# Patient Record
Sex: Female | Born: 1960 | Race: White | Hispanic: No | Marital: Married | Smoking: Current every day smoker
Health system: Southern US, Community
[De-identification: ages and names within clinical notes are randomized; demographics above are authoritative.]

## PROBLEM LIST (undated history)

## (undated) DIAGNOSIS — I671 Cerebral aneurysm, nonruptured: Secondary | ICD-10-CM

## (undated) DIAGNOSIS — K5792 Diverticulitis of intestine, part unspecified, without perforation or abscess without bleeding: Secondary | ICD-10-CM

## (undated) DIAGNOSIS — M1990 Unspecified osteoarthritis, unspecified site: Secondary | ICD-10-CM

## (undated) DIAGNOSIS — K219 Gastro-esophageal reflux disease without esophagitis: Secondary | ICD-10-CM

## (undated) DIAGNOSIS — N189 Chronic kidney disease, unspecified: Secondary | ICD-10-CM

## (undated) DIAGNOSIS — C801 Malignant (primary) neoplasm, unspecified: Secondary | ICD-10-CM

## (undated) DIAGNOSIS — E041 Nontoxic single thyroid nodule: Secondary | ICD-10-CM

## (undated) DIAGNOSIS — Z972 Presence of dental prosthetic device (complete) (partial): Secondary | ICD-10-CM

## (undated) DIAGNOSIS — F4000 Agoraphobia, unspecified: Secondary | ICD-10-CM

## (undated) DIAGNOSIS — F32A Depression, unspecified: Secondary | ICD-10-CM

## (undated) DIAGNOSIS — I2510 Atherosclerotic heart disease of native coronary artery without angina pectoris: Secondary | ICD-10-CM

## (undated) DIAGNOSIS — K449 Diaphragmatic hernia without obstruction or gangrene: Secondary | ICD-10-CM

## (undated) DIAGNOSIS — R51 Headache: Secondary | ICD-10-CM

## (undated) DIAGNOSIS — F329 Major depressive disorder, single episode, unspecified: Secondary | ICD-10-CM

## (undated) DIAGNOSIS — G4730 Sleep apnea, unspecified: Secondary | ICD-10-CM

## (undated) DIAGNOSIS — Z8489 Family history of other specified conditions: Secondary | ICD-10-CM

## (undated) DIAGNOSIS — Z973 Presence of spectacles and contact lenses: Secondary | ICD-10-CM

## (undated) DIAGNOSIS — R519 Headache, unspecified: Secondary | ICD-10-CM

## (undated) HISTORY — PX: COLONOSCOPY W/ BIOPSIES AND POLYPECTOMY: SHX1376

## (undated) HISTORY — PX: CARPAL TUNNEL RELEASE: SHX101

## (undated) HISTORY — PX: MULTIPLE TOOTH EXTRACTIONS: SHX2053

## (undated) HISTORY — PX: BARIATRIC SURGERY: SHX1103

## (undated) HISTORY — PX: TONSILLECTOMY: SUR1361

---

## 2018-03-10 ENCOUNTER — Other Ambulatory Visit: Payer: Self-pay

## 2018-03-10 ENCOUNTER — Encounter (HOSPITAL_COMMUNITY): Payer: Self-pay

## 2018-03-10 ENCOUNTER — Observation Stay (HOSPITAL_COMMUNITY)
Admission: EM | Admit: 2018-03-10 | Discharge: 2018-03-13 | Disposition: A | Discharge status: Home / Self Care | Payer: Medicare Other | Attending: Internal Medicine | Admitting: Internal Medicine

## 2018-03-10 ENCOUNTER — Emergency Department (HOSPITAL_COMMUNITY): Payer: Medicare Other

## 2018-03-10 DIAGNOSIS — D015 Carcinoma in situ of liver, gallbladder and bile ducts: Secondary | ICD-10-CM | POA: Insufficient documentation

## 2018-03-10 DIAGNOSIS — R42 Dizziness and giddiness: Principal | ICD-10-CM | POA: Insufficient documentation

## 2018-03-10 DIAGNOSIS — F4000 Agoraphobia, unspecified: Secondary | ICD-10-CM | POA: Insufficient documentation

## 2018-03-10 DIAGNOSIS — R531 Weakness: Secondary | ICD-10-CM | POA: Diagnosis not present

## 2018-03-10 DIAGNOSIS — F17200 Nicotine dependence, unspecified, uncomplicated: Secondary | ICD-10-CM | POA: Insufficient documentation

## 2018-03-10 DIAGNOSIS — H532 Diplopia: Secondary | ICD-10-CM | POA: Diagnosis not present

## 2018-03-10 DIAGNOSIS — I671 Cerebral aneurysm, nonruptured: Secondary | ICD-10-CM | POA: Insufficient documentation

## 2018-03-10 DIAGNOSIS — R29898 Other symptoms and signs involving the musculoskeletal system: Secondary | ICD-10-CM | POA: Diagnosis not present

## 2018-03-10 DIAGNOSIS — R262 Difficulty in walking, not elsewhere classified: Secondary | ICD-10-CM | POA: Diagnosis not present

## 2018-03-10 DIAGNOSIS — Z720 Tobacco use: Secondary | ICD-10-CM | POA: Diagnosis present

## 2018-03-10 DIAGNOSIS — R200 Anesthesia of skin: Secondary | ICD-10-CM | POA: Diagnosis not present

## 2018-03-10 DIAGNOSIS — Z79899 Other long term (current) drug therapy: Secondary | ICD-10-CM | POA: Diagnosis not present

## 2018-03-10 DIAGNOSIS — F411 Generalized anxiety disorder: Secondary | ICD-10-CM | POA: Diagnosis not present

## 2018-03-10 DIAGNOSIS — C801 Malignant (primary) neoplasm, unspecified: Secondary | ICD-10-CM | POA: Diagnosis present

## 2018-03-10 DIAGNOSIS — R519 Headache, unspecified: Secondary | ICD-10-CM

## 2018-03-10 DIAGNOSIS — G459 Transient cerebral ischemic attack, unspecified: Secondary | ICD-10-CM

## 2018-03-10 DIAGNOSIS — R51 Headache: Secondary | ICD-10-CM

## 2018-03-10 HISTORY — DX: Agoraphobia, unspecified: F40.00

## 2018-03-10 HISTORY — DX: Malignant (primary) neoplasm, unspecified: C80.1

## 2018-03-10 LAB — COMPREHENSIVE METABOLIC PANEL
ALT: 17 U/L (ref 0–44)
AST: 18 U/L (ref 15–41)
Albumin: 3.8 g/dL (ref 3.5–5.0)
Alkaline Phosphatase: 101 U/L (ref 38–126)
Anion gap: 4 — ABNORMAL LOW (ref 5–15)
BILIRUBIN TOTAL: 0.5 mg/dL (ref 0.3–1.2)
BUN: 20 mg/dL (ref 6–20)
CO2: 25 mmol/L (ref 22–32)
Calcium: 9.4 mg/dL (ref 8.9–10.3)
Chloride: 110 mmol/L (ref 98–111)
Creatinine, Ser: 0.97 mg/dL (ref 0.44–1.00)
GFR calc Af Amer: >60 mL/min (ref >60)
GFR calc non Af Amer: >60 mL/min (ref >60)
Glucose, Bld: 98 mg/dL (ref 70–99)
POTASSIUM: 3.9 mmol/L (ref 3.5–5.1)
Sodium: 139 mmol/L (ref 135–145)
TOTAL PROTEIN: 6.9 g/dL (ref 6.5–8.1)

## 2018-03-10 LAB — RAPID URINE DRUG SCREEN, HOSP PERFORMED
Amphetamines: NOT DETECTED
Barbiturates: NOT DETECTED
Benzodiazepines: POSITIVE — AB
Cocaine: NOT DETECTED
Opiates: NOT DETECTED
Tetrahydrocannabinol: NOT DETECTED

## 2018-03-10 LAB — DIFFERENTIAL
Abs Immature Granulocytes: 0.03 10*3/uL (ref 0.00–0.07)
Basophils Absolute: 0 10*3/uL (ref 0.0–0.1)
Basophils Relative: 1 %
Eosinophils Absolute: 0.2 10*3/uL (ref 0.0–0.5)
Eosinophils Relative: 3 %
Immature Granulocytes: 1 %
LYMPHS PCT: 22 %
Lymphs Abs: 1.4 10*3/uL (ref 0.7–4.0)
Monocytes Absolute: 0.4 10*3/uL (ref 0.1–1.0)
Monocytes Relative: 5 %
Neutro Abs: 4.5 10*3/uL (ref 1.7–7.7)
Neutrophils Relative %: 68 %

## 2018-03-10 LAB — PROTIME-INR
INR: 0.97
Prothrombin Time: 12.8 seconds (ref 11.4–15.2)

## 2018-03-10 LAB — URINALYSIS, ROUTINE W REFLEX MICROSCOPIC
Bilirubin Urine: NEGATIVE
Glucose, UA: NEGATIVE mg/dL
HGB URINE DIPSTICK: NEGATIVE
Ketones, ur: NEGATIVE mg/dL
Nitrite: NEGATIVE
PROTEIN: NEGATIVE mg/dL
Specific Gravity, Urine: 1.028 (ref 1.005–1.030)
pH: 5 (ref 5.0–8.0)

## 2018-03-10 LAB — CBC
HCT: 43.3 % (ref 36.0–46.0)
Hemoglobin: 13.2 g/dL (ref 12.0–15.0)
MCH: 28 pg (ref 26.0–34.0)
MCHC: 30.5 g/dL (ref 30.0–36.0)
MCV: 91.9 fL (ref 80.0–100.0)
Platelets: 217 10*3/uL (ref 150–400)
RBC: 4.71 MIL/uL (ref 3.87–5.11)
RDW: 13.1 % (ref 11.5–15.5)
WBC: 6.5 10*3/uL (ref 4.0–10.5)
nRBC: 0 % (ref 0.0–0.2)

## 2018-03-10 LAB — ETHANOL: Alcohol, Ethyl (B): <10 mg/dL (ref <10)

## 2018-03-10 LAB — TSH: TSH: 0.805 u[IU]/mL (ref 0.350–4.500)

## 2018-03-10 LAB — VITAMIN B12: Vitamin B-12: 330 pg/mL (ref 180–914)

## 2018-03-10 LAB — SEDIMENTATION RATE: Sed Rate: 6 mm/hr (ref 0–22)

## 2018-03-10 LAB — APTT: aPTT: 25 seconds (ref 24–36)

## 2018-03-10 MED ORDER — NICOTINE 21 MG/24HR TD PT24
21.0000 mg | MEDICATED_PATCH | Freq: Every day | TRANSDERMAL | Status: DC
  Filled 2018-03-10 (×3): qty 1

## 2018-03-10 MED ORDER — SENNOSIDES-DOCUSATE SODIUM 8.6-50 MG PO TABS
1.0000 | ORAL_TABLET | Freq: Every evening | ORAL | Status: DC | PRN
  Filled 2018-03-10: qty 1

## 2018-03-10 MED ORDER — METOPROLOL SUCCINATE ER 25 MG PO TB24
12.5000 mg | ORAL_TABLET | Freq: Every morning | ORAL | Status: DC
  Administered 2018-03-11 – 2018-03-13 (×3): 12.5 mg via ORAL
  Filled 2018-03-10 (×3): qty 1

## 2018-03-10 MED ORDER — ACETAMINOPHEN 650 MG RE SUPP: 650.0000 mg | RECTAL | Status: DC | PRN

## 2018-03-10 MED ORDER — CELECOXIB 100 MG PO CAPS
200.0000 mg | ORAL_CAPSULE | Freq: Every day | ORAL | Status: DC
  Administered 2018-03-11 – 2018-03-12 (×2): 200 mg via ORAL
  Filled 2018-03-10: qty 2
  Filled 2018-03-10: qty 1
  Filled 2018-03-10: qty 2

## 2018-03-10 MED ORDER — STROKE: EARLY STAGES OF RECOVERY BOOK
Freq: Once | Status: AC
  Administered 2018-03-11: 05:00:00
  Filled 2018-03-10 (×2): qty 1

## 2018-03-10 MED ORDER — ENOXAPARIN SODIUM 40 MG/0.4ML ~~LOC~~ SOLN
40.0000 mg | SUBCUTANEOUS | Status: DC
  Administered 2018-03-10: 40 mg via SUBCUTANEOUS
  Filled 2018-03-10 (×2): qty 0.4

## 2018-03-10 MED ORDER — SODIUM CHLORIDE 0.9 % IV SOLN
INTRAVENOUS | Status: DC
  Administered 2018-03-10 – 2018-03-12 (×4): via INTRAVENOUS

## 2018-03-10 MED ORDER — BUPROPION HCL ER (SR) 150 MG PO TB12
150.0000 mg | ORAL_TABLET | Freq: Two times a day (BID) | ORAL | Status: DC
  Administered 2018-03-11 – 2018-03-13 (×5): 150 mg via ORAL
  Filled 2018-03-10 (×7): qty 1

## 2018-03-10 MED ORDER — ASPIRIN 325 MG PO TABS
325.0000 mg | ORAL_TABLET | Freq: Every day | ORAL | Status: DC
  Administered 2018-03-10 – 2018-03-13 (×4): 325 mg via ORAL
  Filled 2018-03-10 (×4): qty 1

## 2018-03-10 MED ORDER — ACETAMINOPHEN 325 MG PO TABS
650.0000 mg | ORAL_TABLET | ORAL | Status: DC | PRN
  Administered 2018-03-12 (×2): 650 mg via ORAL
  Filled 2018-03-10 (×2): qty 2

## 2018-03-10 MED ORDER — VENLAFAXINE HCL ER 75 MG PO CP24
150.0000 mg | ORAL_CAPSULE | Freq: Every day | ORAL | Status: DC
  Administered 2018-03-11 – 2018-03-13 (×3): 150 mg via ORAL
  Filled 2018-03-10 (×3): qty 2

## 2018-03-10 MED ORDER — LORAZEPAM 2 MG/ML IJ SOLN: 0.5000 mg | INTRAMUSCULAR | Status: DC | PRN

## 2018-03-10 MED ORDER — SODIUM CHLORIDE 0.9 % IV SOLN
1.0000 g | INTRAVENOUS | Status: DC
  Administered 2018-03-10 – 2018-03-11 (×2): 1 g via INTRAVENOUS
  Filled 2018-03-10: qty 1
  Filled 2018-03-10 (×2): qty 10

## 2018-03-10 MED ORDER — PANTOPRAZOLE SODIUM 40 MG PO TBEC
40.0000 mg | DELAYED_RELEASE_TABLET | Freq: Every morning | ORAL | Status: DC
  Administered 2018-03-11 – 2018-03-12 (×2): 40 mg via ORAL
  Filled 2018-03-10 (×3): qty 1

## 2018-03-10 MED ORDER — SIMVASTATIN 20 MG PO TABS
20.0000 mg | ORAL_TABLET | Freq: Every morning | ORAL | Status: DC
  Administered 2018-03-11 – 2018-03-13 (×3): 20 mg via ORAL
  Filled 2018-03-10 (×3): qty 1

## 2018-03-10 MED ORDER — CALCIUM POLYCARBOPHIL 625 MG PO TABS
625.0000 mg | ORAL_TABLET | Freq: Two times a day (BID) | ORAL | Status: DC
  Administered 2018-03-11 – 2018-03-13 (×4): 625 mg via ORAL
  Filled 2018-03-10 (×9): qty 1

## 2018-03-10 MED ORDER — ACETAMINOPHEN 160 MG/5ML PO SOLN: 650.0000 mg | ORAL | Status: DC | PRN

## 2018-03-10 MED ORDER — ASPIRIN 300 MG RE SUPP: 300.0000 mg | Freq: Every day | RECTAL | Status: DC

## 2018-03-10 NOTE — ED Triage Notes (Addendum)
Pt reports on THursday she went into the grocery store and suddenly became dizzy and "legs wouldn't work right."  Reports headache since Thursday, worse today.  Pt also reports numbness to left side of face that started THursday.  Pt also says Thursday she had nausea and blurred vision after the dizziness passed.  Pt says those symptoms have gone away.  PT reports has liver cancer and needs a liver transplant.  Pt says is not currently under any cancer treatment, they are monitoring the growth of the cancer.

## 2018-03-10 NOTE — ED Provider Notes (Signed)
[facility] EMERGENCY DEPARTMENT Provider Note   CSN: [id number] Arrival date & time: 03/10/18  1024     History   Chief Complaint Chief Complaint  Patient presents with  . Dizziness    HPI Rebecca Cox is a 58 y.o. female.  HPI Patient presents with episode of dizziness difficulty walking and vision changes.  Also facial tingling.  Initially had the episode on Thursday with today being Sunday.  Headache started at x2.  Headache is dull.  States she was walking the grocery store turned a corner and her vision became strange.  States the color spread out.  States she had difficulty walking her legs would not do what she wanted to do.  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  Since then has continued headache.  States the vision stuff resolved after around 15 minutes and legs are feeling better but still does not feel back to normal.  States her face still feels tingly.  Has a history of a liver cancer.  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  Patient states the ears were ringing during the episode. Past Medical History:  Diagnosis Date  . Agoraphobia   . Cancer [facility])    liver    There are no active problems to display for this patient.   Past Surgical History:  Procedure Laterality Date  . BARIATRIC SURGERY    . CARPAL TUNNEL RELEASE    . TONSILLECTOMY       OB History   No obstetric history on file.      Home Medications    Prior to Admission medications   Not on File    Family History No family history on file.  Social History Social History   Tobacco Use  . Smoking status: Current Every Day Smoker  . Smokeless tobacco: Never Used  Substance Use Topics  . Alcohol use: Never    Frequency: Never  . Drug use: Never     Allergies   Patient has no known  allergies.   Review of Systems Review of Systems  Constitutional: Negative for appetite change.  HENT: Negative for congestion.   Eyes: Positive for visual disturbance.  Cardiovascular: Negative for chest pain.  Gastrointestinal: Negative for abdominal pain.  Genitourinary: Negative for flank pain.  Musculoskeletal: Negative for back pain.  Skin: Negative for rash.  Neurological: Positive for dizziness, speech difficulty and headaches. Negative for tremors and weakness.  Psychiatric/Behavioral: Negative for confusion.     Physical Exam Updated Vital Signs BP 92/77   Pulse 89   Temp (!) 97.4 F (36.3 C) (Oral)   Resp 16   Ht 5\' 5"  (1.651 m)   Wt 95.3 kg   SpO2 97%   BMI 34.95 kg/m   Physical Exam HENT:     Head: Normocephalic.     Comments: Paresthesias to the left lower face.    Right Ear: Tympanic membrane normal.     Left Ear: Tympanic membrane normal.  Eyes:     Extraocular Movements: Extraocular movements intact.     Pupils: Pupils are equal, round, and reactive to light.     Comments: Nystagmus with gaze to the left.  Neck:     Musculoskeletal: Normal range of motion.  Cardiovascular:     Rate and Rhythm: Regular rhythm.  Pulmonary:     Effort: Pulmonary effort is normal.     Breath sounds: No wheezing or rhonchi.  Abdominal:     Tenderness: There is no abdominal tenderness.  Musculoskeletal:        General: No tenderness.  Skin:    General: Skin is warm.     Capillary Refill: Capillary refill takes less than 2 seconds.  Neurological:     Mental Status: She is alert.     Comments: Patient was diagnosed with gaze to left.  Patient is somewhat slurred speech.  Husband states that this is typical for her.  Good grip strength bilaterally.  Finger-nose intact bilaterally.  Heel shin intact bilaterally.  Mildly unstable standing with eyes closed and arms out.      ED Treatments / Results  Labs (all labs ordered are listed, but only abnormal results are  displayed) Labs Reviewed  COMPREHENSIVE METABOLIC PANEL - Abnormal; Notable for the following components:      Result Value   Anion gap 4 (*)    All other components within normal limits  RAPID URINE DRUG SCREEN, HOSP PERFORMED - Abnormal; Notable for the following components:   Benzodiazepines POSITIVE (*)    All other components within normal limits  URINALYSIS, ROUTINE W REFLEX MICROSCOPIC - Abnormal; Notable for the following components:   APPearance CLOUDY (*)    Leukocytes, UA MODERATE (*)    Bacteria, UA RARE (*)    All other components within normal limits  ETHANOL  PROTIME-INR  APTT  CBC  DIFFERENTIAL    EKG EKG Interpretation  Date/Time:  Sunday March 10 2018 10:38:52 EST Ventricular Rate:  88 PR Interval:    QRS Duration: 98 QT Interval:  371 QTC Calculation: 449 R Axis:   80 Text Interpretation:  Sinus rhythm Low voltage, precordial leads Borderline T wave abnormalities Baseline wander in lead(s) II III aVR aVF V3 V6 Confirmed by Davonna Belling [phone]) on 03/10/2018 12:02:15 PM   Radiology Ct Head Wo Contrast  Result Date: 03/10/2018 CLINICAL DATA:  Acute onset dizziness and difficulty walking today. EXAM: CT HEAD WITHOUT CONTRAST TECHNIQUE: Contiguous axial images were obtained from the base of the skull through the vertex without intravenous contrast. COMPARISON:  None. FINDINGS: Brain: No evidence of acute infarction, hemorrhage, hydrocephalus, extra-axial collection or mass lesion/mass effect. Vascular: No hyperdense vessel or unexpected calcification. Skull: Normal. Negative for fracture or focal lesion. Sinuses/Orbits: Negative. Other: None. IMPRESSION: Negative head CT. Electronically Signed   By: Inge Rise M.D.   On: 03/10/2018 12:09    Procedures Procedures (including critical care time)  Medications Ordered in ED Medications - No data to display   Initial Impression / Assessment and Plan / ED Course  I have reviewed the triage vital signs  and the nursing notes.  Pertinent labs & imaging results that were available during my care of the patient were reviewed by me and considered in my medical decision making (see chart for details).     Patient had an episode 3 days ago where her vision changed she felt dizzy had ringing in her ears and had difficulty walking.  States her legs would not do what she wanted.  Had left-sided facial tingling to states that facial tingling is still there.  Head CT reassuring.  Does have known malignancy in abdomen and chest.  With focal neuro deficits to involve vision and difficulty walking I feel patient would benefit from admission to hospital for further evaluation and treatment.  Final Clinical  Impressions(s) / ED Diagnoses   Final diagnoses:  Difficulty walking    ED Discharge Orders    None       Davonna Belling, MD 03/10/18 1414

## 2018-03-10 NOTE — H&P (Addendum)
History and Physical  Rebecca Cox JJK:[id number] DOB: 1961-01-17 DOA: 03/10/2018  Referring physician: Alvino Chapel MD  PCP: System, Pcp Not In   Chief Complaint: Dizziness   HPI: Rebecca Cox is a 58 y.o. female with Agoraphobia and generalized anxiety disorder presented to the emergency department today with complaints of dizziness.  The patient describes that she started having headaches and dizziness associated with left-sided facial tingling 3 days ago and another episode occurred today.  She reported walking to the grocery store and having sudden onset of visual changes.  She also had difficulty walking and describes that her legs would not do what she wanted them to do.  She said that she had taken Klonopin prior to going to the store today.  She has to take that to leave her home because of her anxiety disorder and agoraphobia.  She has continued to have headache intermittently.  Most of her symptoms resolved after 15 minutes but she still continues to have left facial tingling and drooling around the left side of the mouth.  She has a history of epithelioid hemangioendothelioma of the liver and she is followed at [facility].  She will need to have a liver transplant to treat her condition.  She was recently seen by them and was stable and told to follow-up in 1 year.  The patient says that during the episode where she had visual changes she also had ringing in the ears.  She was seen in the emergency department today and a CT of the brain did not reveal any acute findings.  Given her symptomatology and presentation it was felt that a TIA/CVA work-up should be obtained to rule out acute injury.  The patient is being admitted for observation.  Review of Systems:  Constitutional: Negative for appetite change.  HENT: Negative for congestion.   Eyes: Positive for visual disturbance.  Cardiovascular: Negative for chest pain.  Gastrointestinal: Negative for abdominal pain.  Genitourinary: Negative for  flank pain.  Musculoskeletal: Negative for back pain.  Skin: Negative for rash.  Neurological: Positive for dizziness, speech difficulty and headaches. Negative for tremors and weakness.  Psychiatric/Behavioral: Negative for confusion.  All systems reviewed and apart from history of presenting illness, are negative.  Past Medical History:  Diagnosis Date  . Agoraphobia   . Cancer [facility])    liver   Past Surgical History:  Procedure Laterality Date  . BARIATRIC SURGERY    . CARPAL TUNNEL RELEASE    . TONSILLECTOMY     Social History:  reports that she has been smoking. She has never used smokeless tobacco. She reports that she does not drink alcohol or use drugs.  No Known Allergies  History reviewed. No pertinent family history.  Prior to Admission medications   Not on File  Awaiting for home medications to be reconciled  Physical Exam: Vitals:   03/10/18 1031 03/10/18 1036 03/10/18 1130 03/10/18 1200  BP:  (!) 129/98 126/77 92/77  Pulse:  89    Resp:  16 13 16   Temp:  (!) 97.4 F (36.3 C)    TempSrc:  Oral    SpO2:  100%  97%  Weight: 95.3 kg     Height: 5\' 5"  (1.651 m)        General exam: Moderately built and nourished patient, lying comfortably supine on the gurney in no obvious distress.  Head, eyes and ENT: Nontraumatic and normocephalic. Pupils equally reacting to light and accommodation.  Leftward gaze with nystagmus noted.  Oral mucosa  moist.  Neck: Supple. No JVD, carotid bruit or thyromegaly.  Lymphatics: No lymphadenopathy.  Respiratory system: Clear to auscultation. No increased work of breathing.  Cardiovascular system: Normal S1 and S2 heard, RRR. No JVD, murmurs, gallops, clicks or pedal edema.  Gastrointestinal system: Abdomen is nondistended, soft and nontender. Normal bowel sounds heard. No organomegaly or masses appreciated.  Central nervous system: Alert and oriented.  Normal speech.  Paresthesia noted left lower face.  Small amount of  dribble at left side of mouth.  Tongue protrudes symmetrically.  Extremities: Symmetric 5 x 5 power. Peripheral pulses symmetrically felt.   Skin: No rashes or acute findings.  Musculoskeletal system: Negative exam.  Psychiatry: Pleasant and cooperative.  Labs on Admission:  Basic Metabolic Panel: Recent Labs  Lab 03/10/18 1058  NA 139  K 3.9  CL 110  CO2 25  GLUCOSE 98  BUN 20  CREATININE 0.97  CALCIUM 9.4   Liver Function Tests: Recent Labs  Lab 03/10/18 1058  AST 18  ALT 17  ALKPHOS 101  BILITOT 0.5  PROT 6.9  ALBUMIN 3.8   No results for input(s): LIPASE, AMYLASE in the last 168 hours. No results for input(s): AMMONIA in the last 168 hours. CBC: Recent Labs  Lab 03/10/18 1058  WBC 6.5  NEUTROABS 4.5  HGB 13.2  HCT 43.3  MCV 91.9  PLT 217   Cardiac Enzymes: No results for input(s): CKTOTAL, CKMB, CKMBINDEX, TROPONINI in the last 168 hours.  BNP (last 3 results) No results for input(s): PROBNP in the last 8760 hours. CBG: No results for input(s): GLUCAP in the last 168 hours.  Radiological Exams on Admission: Ct Head Wo Contrast  Result Date: 03/10/2018 CLINICAL DATA:  Acute onset dizziness and difficulty walking today. EXAM: CT HEAD WITHOUT CONTRAST TECHNIQUE: Contiguous axial images were obtained from the base of the skull through the vertex without intravenous contrast. COMPARISON:  None. FINDINGS: Brain: No evidence of acute infarction, hemorrhage, hydrocephalus, extra-axial collection or mass lesion/mass effect. Vascular: No hyperdense vessel or unexpected calcification. Skull: Normal. Negative for fracture or focal lesion. Sinuses/Orbits: Negative. Other: None. IMPRESSION: Negative head CT. Electronically Signed   By: Inge Rise M.D.   On: 03/10/2018 12:09   EKG: Independently reviewed. NSR no acute ST-T findings.  Assessment/Plan Principal Problem:   Left facial numbness Active Problems:   Bilateral leg weakness   Tobacco abuse    Cancer (HCC)   Agoraphobia  1. Acute neurological changes -the patient continues to have a left facial numbness and drooling from the left side of the mouth.  There is no facial droop seen.  The patient had weakness in the extremities and difficulty controlling her extremities for about 15 minutes.  There is concern about TIA.  CT head is negative.  She is being admitted for work-up.  Aspirin for antiplatelet therapy ordered.  Check an MRI brain/MRA tomorrow.  Check 2D echocardiogram and carotid Doppler studies.  Check a fasting lipid panel and A1c.  Check TSH, B12.  Monitor on continuous telemetry overnight.  Obtain PT/OT/SLP evaluation.  Obtain an EEG.  If her testing is negative she could likely discharge home tomorrow.  If not, neurology consult may be warranted. 2. Chronic active nicotine dependence- I encouraged the patient to please stop using tobacco products.  She was counseled and verbalized understanding.  I have offered a nicotine patch to be used in the hospital to assist with nicotine cravings. 3. Generalized anxiety disorder with agoraphobia - lorazepam ordered as needed for  symptoms.  Patient says that she is able to do MRI and she has done them in the past.  She normally takes anxiety medicine prior to the procedure. 4. Epithelioid hemangioendothelioma-patient is followed for this at [facility] and was recently seen and has a 1 year follow-up.  The only treatment available is liver transplant which she needs to stop smoking before she can be seriously considered for this according to records.  DVT Prophylaxis: Lovenox Code Status: Full Family Communication: Husband at bedside Disposition Plan: Home in 1 to 2 days as work-up is completed  Time spent: 57 minutes  Irwin Brakeman, MD [facility]  If 7PM-7AM, please contact night-coverage [email or web address] Password [email or web address] 03/10/2018, 2:57 PM

## 2018-03-11 ENCOUNTER — Observation Stay (HOSPITAL_COMMUNITY): Payer: Medicare Other

## 2018-03-11 ENCOUNTER — Observation Stay (HOSPITAL_COMMUNITY)
Admit: 2018-03-11 | Discharge: 2018-03-11 | Disposition: A | Discharge status: Home / Self Care | Payer: Medicare Other | Attending: Family Medicine | Admitting: Family Medicine

## 2018-03-11 ENCOUNTER — Observation Stay (HOSPITAL_BASED_OUTPATIENT_CLINIC_OR_DEPARTMENT_OTHER): Payer: Medicare Other

## 2018-03-11 DIAGNOSIS — G459 Transient cerebral ischemic attack, unspecified: Secondary | ICD-10-CM

## 2018-03-11 DIAGNOSIS — R200 Anesthesia of skin: Secondary | ICD-10-CM | POA: Diagnosis not present

## 2018-03-11 DIAGNOSIS — C801 Malignant (primary) neoplasm, unspecified: Secondary | ICD-10-CM

## 2018-03-11 DIAGNOSIS — F4000 Agoraphobia, unspecified: Secondary | ICD-10-CM | POA: Diagnosis not present

## 2018-03-11 LAB — ECHOCARDIOGRAM COMPLETE
Height: 65 in
Weight: 3360 oz

## 2018-03-11 LAB — LIPID PANEL
Cholesterol: 175 mg/dL (ref 0–200)
HDL: 48 mg/dL (ref >40)
LDL Cholesterol: 96 mg/dL (ref 0–99)
Total CHOL/HDL Ratio: 3.6 RATIO
Triglycerides: 155 mg/dL — ABNORMAL HIGH (ref <150)
VLDL: 31 mg/dL (ref 0–40)

## 2018-03-11 LAB — COMPREHENSIVE METABOLIC PANEL
ALT: 14 U/L (ref 0–44)
AST: 16 U/L (ref 15–41)
Albumin: 3.2 g/dL — ABNORMAL LOW (ref 3.5–5.0)
Alkaline Phosphatase: 80 U/L (ref 38–126)
Anion gap: 3 — ABNORMAL LOW (ref 5–15)
BUN: 22 mg/dL — ABNORMAL HIGH (ref 6–20)
CO2: 23 mmol/L (ref 22–32)
Calcium: 8.9 mg/dL (ref 8.9–10.3)
Chloride: 113 mmol/L — ABNORMAL HIGH (ref 98–111)
Creatinine, Ser: 1.03 mg/dL — ABNORMAL HIGH (ref 0.44–1.00)
GFR calc Af Amer: >60 mL/min (ref >60)
GFR calc non Af Amer: >60 mL/min (ref >60)
Glucose, Bld: 77 mg/dL (ref 70–99)
POTASSIUM: 4.1 mmol/L (ref 3.5–5.1)
Sodium: 139 mmol/L (ref 135–145)
Total Bilirubin: 0.3 mg/dL (ref 0.3–1.2)
Total Protein: 5.7 g/dL — ABNORMAL LOW (ref 6.5–8.1)

## 2018-03-11 LAB — SEDIMENTATION RATE: Sed Rate: 7 mm/hr (ref 0–22)

## 2018-03-11 LAB — HEMOGLOBIN A1C
Hgb A1c MFr Bld: 4.7 % — ABNORMAL LOW (ref 4.8–5.6)
Mean Plasma Glucose: 88.19 mg/dL

## 2018-03-11 MED ORDER — LORAZEPAM 2 MG/ML IJ SOLN: 1.0000 mg | Freq: Once | INTRAMUSCULAR | Status: DC | PRN

## 2018-03-11 MED ORDER — CLONAZEPAM 0.5 MG PO TABS
0.5000 mg | ORAL_TABLET | Freq: Two times a day (BID) | ORAL | Status: DC
  Administered 2018-03-11 – 2018-03-13 (×5): 0.5 mg via ORAL
  Filled 2018-03-11 (×5): qty 1

## 2018-03-11 MED ORDER — CLONAZEPAM 0.5 MG PO TABS: 0.5000 mg | ORAL_TABLET | Freq: Three times a day (TID) | ORAL | Status: DC

## 2018-03-11 MED ORDER — CLONAZEPAM 0.5 MG PO TABS
0.2500 mg | ORAL_TABLET | Freq: Every day | ORAL | Status: DC | PRN
  Administered 2018-03-12: 0.5 mg via ORAL
  Filled 2018-03-11 (×2): qty 1

## 2018-03-11 NOTE — Progress Notes (Signed)
SLP Cancellation Note  Patient Details Name: Rebecca Cox MRN: [id number] DOB: [birth year]   Cancelled treatment:       Reason Eval/Treat Not Completed: SLP screened, no needs identified, will sign off; SLP screened Pt in room. Pt denies any changes in swallowing, speech, language, or cognition. MRI negative for acute changes. SLE will be deferred at this time. Reconsult if indicated. SLP will sign off. Pt indicates that she recently had several teeth pulled about three weeks ago.  Thank you,  Genene Churn, [place]     [place] 03/11/2018, 1:02 PM

## 2018-03-11 NOTE — Care Management CC44 (Signed)
Condition Code 44 Documentation Completed  Patient Details  Name: Rebecca Cox MRN: [id number] Date of Birth: 1960-12-09   Condition Code 44 given:    Patient signature on Condition Code 44 notice:    Documentation of 2 MD's agreement:    Code 44 added to claim:       [name] 03/11/2018, 10:54 AM

## 2018-03-11 NOTE — Evaluation (Signed)
Physical Therapy Evaluation Patient Details Name: Rebecca Cox MRN: [id number] DOB: 24-Jul-1960 Today's Date: 03/11/2018   History of Present Illness  Rebecca Cox is a 58 y.o. female with Agoraphobia and generalized anxiety disorder presented to the emergency department today with complaints of dizziness.  The patient describes that she started having headaches and dizziness associated with left-sided facial tingling 3 days ago and another episode occurred today.  She reported walking to the grocery store and having sudden onset of visual changes.  She also had difficulty walking and describes that her legs would not do what she wanted them to do.  She said that she had taken Klonopin prior to going to the store today.  She has to take that to leave her home because of her anxiety disorder and agoraphobia.  She has continued to have headache intermittently.  Most of her symptoms resolved after 15 minutes but she still continues to have left facial tingling and drooling around the left side of the mouth.    Clinical Impression  Patient functioning at baseline for functional mobility and gait.  Plan:  Patient discharged from physical therapy to care of nursing for ambulation daily as tolerated for length of stay.     Follow Up Recommendations No PT follow up    Equipment Recommendations  None recommended by PT    Recommendations for Other Services       Precautions / Restrictions Precautions Precautions: None Restrictions Weight Bearing Restrictions: No      Mobility  Bed Mobility Overal bed mobility: Independent                Transfers Overall transfer level: Independent                  Ambulation/Gait Ambulation/Gait assistance: Modified independent (Device/Increase time) Gait Distance (Feet): 300 Feet Assistive device: None Gait Pattern/deviations: WFL(Within Functional Limits) Gait velocity: near normal   General Gait Details: good return for ambulation on  level, inclined and declined surfaces without loss of balance  Stairs            Wheelchair Mobility    Modified Rankin (Stroke Patients Only)       Balance Overall balance assessment: No apparent balance deficits (not formally assessed)                                           Pertinent Vitals/Pain Pain Assessment: No/denies pain    Home Living Family/patient expects to be discharged to:: Private residence Living Arrangements: Spouse/significant other Available Help at Discharge: Family Type of Home: House Home Access: Stairs to enter Entrance Stairs-Rails: Right Entrance Stairs-Number of Steps: 3 Home Layout: One level Home Equipment: Cane - single point;Cane - quad;Shower seat - built in      Prior Function Level of Independence: Independent         Comments: Hydrographic surveyor, drives     Journalist, newspaper        Extremity/Trunk Assessment   Upper Extremity Assessment Upper Extremity Assessment: Overall WFL for tasks assessed    Lower Extremity Assessment Lower Extremity Assessment: Overall WFL for tasks assessed    Cervical / Trunk Assessment Cervical / Trunk Assessment: Normal  Communication   Communication: No difficulties  Cognition Arousal/Alertness: Awake/alert Behavior During Therapy: WFL for tasks assessed/performed Overall Cognitive Status: Within Functional Limits for tasks assessed  General Comments      Exercises     Assessment/Plan    PT Assessment Patent does not need any further PT services  PT Problem List         PT Treatment Interventions      PT Goals (Current goals can be found in the Care Plan section)  Acute Rehab PT Goals Patient Stated Goal: return home with spouse to assist PT Goal Formulation: With patient/family Time For Goal Achievement: 03/11/18 Potential to Achieve Goals: Good    Frequency     Barriers to discharge         Co-evaluation               AM-PAC PT "6 Clicks" Mobility  Outcome Measure Help needed turning from your back to your side while in a flat bed without using bedrails?: None Help needed moving from lying on your back to sitting on the side of a flat bed without using bedrails?: None Help needed moving to and from a bed to a chair (including a wheelchair)?: None Help needed standing up from a chair using your arms (e.g., wheelchair or bedside chair)?: None Help needed to walk in hospital room?: None Help needed climbing 3-5 steps with a railing? : None 6 Click Score: 24    End of Session   Activity Tolerance: Patient tolerated treatment well Patient left: in bed;with call bell/phone within reach;with family/visitor present(seated at bedsde) Nurse Communication: Mobility status PT Visit Diagnosis: Unsteadiness on feet (R26.81);Other abnormalities of gait and mobility (R26.89);Muscle weakness (generalized) (M62.81)    Time: 1020-1035 PT Time Calculation (min) (ACUTE ONLY): 15 min   Charges:   PT Evaluation $PT Eval Low Complexity: 1 Low PT Treatments $Gait Training: 8-22 mins        1:59 PM, 03/11/18 Lonell Grandchild, MPT Physical Therapist with [facility] [phone] office [phone] mobile phone

## 2018-03-11 NOTE — Care Management Obs Status (Signed)
[place] NOTIFICATION   Patient Details  Name: Rebecca Cox MRN: [id number] Date of Birth: [birth year]   Medicare Observation Status Notification Given:  Yes    [name] 03/11/2018, 10:52 AM

## 2018-03-11 NOTE — Procedures (Addendum)
ELECTROENCEPHALOGRAM REPORT   Patient: Rebecca Cox       Room #: A333 EEG No. ID: [id number] Age: 58 y.o.        Sex: female Referring Physician: Memon Report Date:  03/11/2018        Interpreting Physician: Alexis Goodell  History: Rebecca Cox is an 58 y.o. female with acute neurological changes  Medications:  ASA, Wellbutrin, Rocephin, Celebrex, Klonopin, Toprol, Protonix, Zocor, Effexor  Conditions of Recording:  This is a 21 channel routine scalp EEG performed with bipolar and monopolar montages arranged in accordance to the international 10/20 system of electrode placement. One channel was dedicated to EKG recording.  The patient is in the awake state.  Description:  The waking background activity consists of a low voltage, symmetrical, fairly well organized, 10 Hz alpha activity, seen from the parieto-occipital and posterior temporal regions.  Low voltage fast activity, poorly organized, is seen anteriorly and is at times superimposed on more posterior regions.  A mixture of theta and alpha rhythms are seen from the central and temporal regions. The patient does not drowse or sleep. No epileptiform activity is noted.   Hyperventilation was not performed.  Intermittent photic stimulation was performed and elicits a symmetrical driving response but fails to elicit any abnormalities.  IMPRESSION: This is a normal electroencephalogram, awake and with activation procedures. There are no focal lateralizing or epileptiform features.   Alexis Goodell, MD Neurology [phone] 03/11/2018, 1:29 PM

## 2018-03-11 NOTE — Progress Notes (Signed)
EEG completed, results pending. 

## 2018-03-11 NOTE — Progress Notes (Signed)
  Echocardiogram 2D Echocardiogram has been performed.  Rebecca Cox 03/11/2018, 10:25 AM

## 2018-03-11 NOTE — Progress Notes (Signed)
SLP Cancellation Note  Patient Details Name: Rebecca Cox MRN: [id number] DOB: September 05, 1960   Cancelled treatment:       Reason Eval/Treat Not Completed: Patient at procedure or test/unavailable; Testing in room at this time. SLP will check back to determine if SLE is indicated. Chart review reveals that Pt is back to baseline except for facial tingling. MRI is negative for acute changes.  Thank you,  Genene Churn, [place]    [place] 03/11/2018, 12:09 PM

## 2018-03-11 NOTE — Progress Notes (Signed)
OT Cancellation Note  Patient Details Name: Rebecca Cox MRN: [id number] DOB: [birth year]   Cancelled Treatment:    Reason Eval/Treat Not Completed: OT screened, no needs identified, will sign off. Pt screened for OT needs. Pt reports symptoms have resolved with exception of slight facial tingling. Pt demonstrates BUE strength WNL, coordination and sensation are intact. Pt is performing ADLs independently this am, managing IV pole and line without difficulty. Pt is at baseline with ADL completion and functional mobility, no further OT services required at this time. Thank you for this referral.  Guadelupe Sabin, OTR/L  [phone] 03/11/2018, 7:52 AM

## 2018-03-11 NOTE — Progress Notes (Signed)
PROGRESS NOTE    Rebecca Cox  PJK:[id number] DOB: Jun 21, 1960 DOA: 03/10/2018 PCP: System, Pcp Not In    Brief Narrative:  58 year old female with chronic lightheadedness, presents to the hospital with complaints of transient dizziness, diplopia, and numbness on the left side of her face.  Work-up thus far has been unrevealing.  MRI does not show any acute infarct.  Neurology consultation has been requested.   Assessment & Plan:   Principal Problem:   Left facial numbness Active Problems:   Bilateral leg weakness   Tobacco abuse   Cancer (HCC)   Agoraphobia   1. Left facial numbness.  Patient describes transient left facial numbness, transient diplopia which has since resolved.  She has noticed some drooling out of the left side of her mouth.  MRI of the brain did not show any evidence of acute infarct.  Echocardiogram and carotid Dopplers also unrevealing.  EEG done does not show any evidence of epileptiform discharges.  Neurology consultation has been requested. 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 3. 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  Continue on benzodiazepines as needed. 4. Chronic nicotine dependence.  Counseled on the importance of cessation. 5. History of bariatric surgery.  She reports approximately 100 pound weight loss in the past year.   DVT prophylaxis: Lovenox Code Status: Full code Family Communication: Discussed with husband at the bedside Disposition Plan: Discharge home once neurology work-up is complete   Consultants:     Procedures:   YKD:XIPJ is a normal electroencephalogram, awake and with activation procedures. There are no focal lateralizing or epileptiform features. Echo:- Left ventricle: The cavity size was normal. Wall thickness was   increased in a pattern of mild LVH. Systolic function was normal.   The estimated ejection fraction was in the range of 60% to 65%.   Wall motion was normal; there  were no regional wall motion   abnormalities. Findings consistent with left ventricular   diastolic dysfunction, grade indeterminate.  - Atrial septum: No defect or patent foramen ovale was identified.  Antimicrobials:      Subjective: Still have some numbness on the left cheek.  No further vision disturbances.  Objective: Vitals:   03/11/18 1330 03/11/18 1443 03/11/18 1530 03/11/18 1730  BP: 101/79 106/76 105/71 99/74  Pulse: 79 78 80 79  Resp:  18 19   Temp: 98.4 F (36.9 C) 98.7 F (37.1 C) 98.5 F (36.9 C) 98.4 F (36.9 C)  TempSrc: Oral Oral Oral Oral  SpO2:  98% 99% 98%  Weight:      Height:        Intake/Output Summary (Last 24 hours) at 03/11/2018 1953 Last data filed at 03/11/2018 1700 Gross per 24 hour  Intake 1340.06 ml  Output -  Net 1340.06 ml   Filed Weights   03/10/18 1031  Weight: 95.3 kg    Examination:  General exam: Appears calm and comfortable  Respiratory system: Clear to auscultation. Respiratory effort normal. Cardiovascular system: S1 & S2 heard, RRR. No JVD, murmurs, rubs, gallops or clicks. No pedal edema. Gastrointestinal system: Abdomen is nondistended, soft and nontender. No organomegaly or masses felt. Normal bowel sounds heard. Central nervous system: Alert and oriented. No focal neurological deficits. Extremities: Symmetric 5 x 5 power. Skin: No rashes, lesions or ulcers Psychiatry: Judgement and insight appear normal. Mood & affect appropriate.     Data Reviewed: I have personally reviewed following labs and imaging studies  CBC: Recent Labs  Lab 03/10/18 1058  WBC 6.5  NEUTROABS 4.5  HGB 13.2  HCT 43.3  MCV 91.9  PLT 710   Basic Metabolic Panel: Recent Labs  Lab 03/10/18 1058  NA 139  K 3.9  CL 110  CO2 25  GLUCOSE 98  BUN 20  CREATININE 0.97  CALCIUM 9.4   GFR: Estimated Creatinine Clearance: 74.8 mL/min (by C-G formula based on SCr of 0.97 mg/dL). Liver Function Tests: Recent Labs  Lab 03/10/18 1058   AST 18  ALT 17  ALKPHOS 101  BILITOT 0.5  PROT 6.9  ALBUMIN 3.8   No results for input(s): LIPASE, AMYLASE in the last 168 hours. No results for input(s): AMMONIA in the last 168 hours. Coagulation Profile: Recent Labs  Lab 03/10/18 1058  INR 0.97   Cardiac Enzymes: No results for input(s): CKTOTAL, CKMB, CKMBINDEX, TROPONINI in the last 168 hours. BNP (last 3 results) No results for input(s): PROBNP in the last 8760 hours. HbA1C: Recent Labs    03/11/18 0622  HGBA1C 4.7*   CBG: No results for input(s): GLUCAP in the last 168 hours. Lipid Profile: Recent Labs    03/11/18 0624  CHOL 175  HDL 48  LDLCALC 96  TRIG 155*  CHOLHDL 3.6   Thyroid Function Tests: Recent Labs    03/10/18 1558  TSH 0.805   Anemia Panel: Recent Labs    03/10/18 1558  VITAMINB12 330   Sepsis Labs: No results for input(s): PROCALCITON, LATICACIDVEN in the last 168 hours.  Recent Results (from the past 240 hour(s))  Culture, blood (Routine X 2) w Reflex to ID Panel     Status: None (Preliminary result)   Collection Time: 03/10/18  4:03 PM  Result Value Ref Range Status   Specimen Description BLOOD RIGHT ARM  Final   Special Requests   Final    BOTTLES DRAWN AEROBIC AND ANAEROBIC Blood Culture adequate volume   Culture   Final    NO GROWTH < 24 HOURS Performed at [facility], [address] [place], [place] [address]    Report Status PENDING  Incomplete  Culture, blood (Routine X 2) w Reflex to ID Panel     Status: None (Preliminary result)   Collection Time: 03/10/18  4:09 PM  Result Value Ref Range Status   Specimen Description BLOOD LEFT HAND  Final   Special Requests   Final    BOTTLES DRAWN AEROBIC AND ANAEROBIC Blood Culture adequate volume   Culture   Final    NO GROWTH < 24 HOURS Performed at [facility], [address] [place], [place] [address]    Report Status PENDING  Incomplete         Radiology Studies: Ct Head Wo Contrast  Result Date:  03/10/2018 CLINICAL DATA:  Acute onset dizziness and difficulty walking today. EXAM: CT HEAD WITHOUT CONTRAST TECHNIQUE: Contiguous axial images were obtained from the base of the skull through the vertex without intravenous contrast. COMPARISON:  None. FINDINGS: Brain: No evidence of acute infarction, hemorrhage, hydrocephalus, extra-axial collection or mass lesion/mass effect. Vascular: No hyperdense vessel or unexpected calcification. Skull: Normal. Negative for fracture or focal lesion. Sinuses/Orbits: Negative. Other: None. IMPRESSION: Negative head CT. Electronically Signed   By: Inge Rise M.D.   On: 03/10/2018 12:09   Mr Brain Wo Contrast  Result Date: 03/11/2018 CLINICAL DATA:  Headache, new, malignancy suspected. Dizziness and weakness for 5 days EXAM: MRI HEAD WITHOUT CONTRAST MRA HEAD WITHOUT CONTRAST TECHNIQUE: Multiplanar, multiecho pulse sequences of the brain and surrounding structures  were obtained without intravenous contrast. Angiographic images of the head were obtained using MRA technique without contrast. COMPARISON:  None. FINDINGS: MRI HEAD FINDINGS Brain: No acute infarction, hemorrhage, hydrocephalus, extra-axial collection or mass lesion. Mild patchy FLAIR hyperintensity in the pons. Even milder periventricular FLAIR hyperintensity. Normal brain volume. Partially empty sella considered incidental in isolation. Vascular: Arterial findings below. Normal dural venous sinus flow voids. 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 No major branch occlusion or flow limiting stenosis. Negative for vessel beading. IMPRESSION: Brain MRI: 1. No acute finding or specific explanation for headache. 2. Mild signal abnormality in the pons and  periventricular white matter that is usually from chronic small vessel ischemia. Intracranial MRA: 1. 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 2. No emergent finding. Electronically Signed   By: Monte Fantasia M.D.   On: 03/11/2018 09:08   US Carotid Bilateral (at [facility] And [facility] Only)  Result Date: 03/11/2018 CLINICAL DATA:  58 year old female with history of vertigo EXAM: BILATERAL CAROTID DUPLEX ULTRASOUND TECHNIQUE: Pearline Cables scale imaging, color Doppler and duplex ultrasound were performed of bilateral carotid and vertebral arteries in the neck. COMPARISON:  None. FINDINGS: Criteria: Quantification of carotid stenosis is based on velocity parameters that correlate the residual internal carotid diameter with NASCET-based stenosis levels, using the diameter of the distal internal carotid lumen as the denominator for stenosis measurement. The following velocity measurements were obtained: RIGHT ICA:  Systolic 91 cm/sec, Diastolic 34 cm/sec CCA:  102 cm/sec SYSTOLIC ICA/CCA RATIO:  0.9 ECA:  96 cm/sec LEFT ICA:  Systolic 78 cm/sec, Diastolic 38 cm/sec CCA:  93 cm/sec SYSTOLIC ICA/CCA RATIO:  0.8 ECA:  77 cm/sec Right Brachial SBP: Not acquired Left Brachial SBP: Not acquired RIGHT CAROTID ARTERY: No significant calcified disease of the right common carotid artery. Intermediate waveform maintained. Heterogeneous plaque without significant calcifications at the right carotid bifurcation. Low resistance waveform of the right ICA. No significant tortuosity. RIGHT VERTEBRAL ARTERY: Antegrade flow with low resistance waveform. LEFT CAROTID ARTERY: No significant calcified disease of the left common carotid artery. Intermediate waveform maintained. Heterogeneous plaque at the left carotid bifurcation without significant calcifications. Low resistance waveform of the left ICA. LEFT VERTEBRAL ARTERY:  Antegrade flow with low resistance waveform. Additional: Right thyroid nodule measures 1.8 cm. IMPRESSION: Color duplex  indicates minimal heterogeneous plaque, with no hemodynamically significant stenosis by duplex criteria in the extracranial cerebrovascular circulation. Incidental finding of right thyroid nodule measuring 1.8 cm. Dedicated thyroid ultrasound recommended if further characterisation warranted. Signed, Dulcy Fanny. Dellia Nims, RPVI [facility] Radiology Electronically Signed   By: Corrie Mckusick D.O.   On: 03/11/2018 09:41   Mr Jodene Nam Head/brain VO Cm  Result Date: 03/11/2018 CLINICAL DATA:  Headache, new, malignancy suspected. Dizziness and weakness for 5 days EXAM: MRI HEAD WITHOUT CONTRAST MRA HEAD WITHOUT CONTRAST TECHNIQUE: Multiplanar, multiecho pulse sequences of the brain and surrounding structures were obtained without intravenous contrast. Angiographic images of the head were obtained using MRA technique without contrast. COMPARISON:  None. FINDINGS: MRI HEAD FINDINGS Brain: No acute infarction, hemorrhage, hydrocephalus, extra-axial collection or mass lesion. Mild patchy FLAIR hyperintensity in the pons. Even milder periventricular FLAIR hyperintensity. Normal brain volume. Partially empty sella considered incidental in isolation. Vascular: Arterial findings below.  Normal dural venous sinus flow voids. 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 No major branch occlusion or flow limiting stenosis. Negative for vessel beading. IMPRESSION: Brain MRI: 1. No acute finding or specific explanation for headache. 2. Mild signal abnormality in the pons and periventricular white matter that is usually from chronic small vessel ischemia. Intracranial MRA: 1. 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 2. No emergent finding.  Electronically Signed   By: Monte Fantasia M.D.   On: 03/11/2018 09:08        Scheduled Meds: . aspirin  300 mg Rectal Daily   Or  . aspirin  325 mg Oral Daily  . buPROPion  150 mg Oral BID  . celecoxib  200 mg Oral Q1500  . clonazePAM  0.5 mg Oral BID  . enoxaparin (LOVENOX) injection  40 mg Subcutaneous Q24H  . metoprolol succinate  12.5 mg Oral q morning - 10a  . nicotine  21 mg Transdermal Daily  . pantoprazole  40 mg Oral q morning - 10a  . polycarbophil  625 mg Oral BID  . simvastatin  20 mg Oral q morning - 10a  . venlafaxine XR  150 mg Oral Q breakfast   Continuous Infusions: . sodium chloride 75 mL/hr at 03/11/18 1829  . cefTRIAXone (ROCEPHIN)  IV 1 g (03/11/18 1830)     LOS: 0 days    Time spent: 30mins    Kathie Dike, MD [facility] Pager [phone]  If 7PM-7AM, please contact night-coverage [email or web address] Password [email or web address] 03/11/2018, 7:53 PM

## 2018-03-12 ENCOUNTER — Observation Stay (HOSPITAL_COMMUNITY): Payer: Medicare Other

## 2018-03-12 DIAGNOSIS — R29898 Other symptoms and signs involving the musculoskeletal system: Secondary | ICD-10-CM | POA: Diagnosis not present

## 2018-03-12 DIAGNOSIS — F4000 Agoraphobia, unspecified: Secondary | ICD-10-CM | POA: Diagnosis not present

## 2018-03-12 DIAGNOSIS — I671 Cerebral aneurysm, nonruptured: Secondary | ICD-10-CM | POA: Diagnosis present

## 2018-03-12 DIAGNOSIS — C801 Malignant (primary) neoplasm, unspecified: Secondary | ICD-10-CM | POA: Diagnosis not present

## 2018-03-12 LAB — CSF CELL COUNT WITH DIFFERENTIAL
RBC Count, CSF: 2 /mm3 — ABNORMAL HIGH
Tube #: 4
WBC, CSF: 0 /mm3 (ref 0–5)

## 2018-03-12 LAB — VITAMIN D 25 HYDROXY (VIT D DEFICIENCY, FRACTURES): VIT D 25 HYDROXY: 46.3 ng/mL (ref 30.0–100.0)

## 2018-03-12 LAB — CRYPTOCOCCAL ANTIGEN, CSF: Crypto Ag: NEGATIVE

## 2018-03-12 LAB — URINE CULTURE

## 2018-03-12 LAB — PHOSPHORUS: Phosphorus: 3.9 mg/dL (ref 2.5–4.6)

## 2018-03-12 LAB — PROTEIN AND GLUCOSE, CSF
GLUCOSE CSF: 55 mg/dL (ref 40–70)
Total  Protein, CSF: 22 mg/dL (ref 15–45)

## 2018-03-12 LAB — MAGNESIUM: Magnesium: 2 mg/dL (ref 1.7–2.4)

## 2018-03-12 LAB — HIV ANTIBODY (ROUTINE TESTING W REFLEX): HIV Screen 4th Generation wRfx: NONREACTIVE

## 2018-03-12 MED ORDER — POVIDONE-IODINE 10 % EX SOLN
CUTANEOUS | Status: AC
  Filled 2018-03-12: qty 15

## 2018-03-12 MED ORDER — GADOBUTROL 1 MMOL/ML IV SOLN
10.0000 mL | Freq: Once | INTRAVENOUS | Status: AC | PRN
  Administered 2018-03-12: 10 mL via INTRAVENOUS

## 2018-03-12 NOTE — Consult Note (Addendum)
Rebecca A. Merlene Laughter, MD     [email or web address]          Rebecca Cox is an 58 y.o. female.   ASSESSMENT/PLAN: 1.  Multiple symptoms without clear unifying diagnosis.  The patient does have residual lower left facial weakness and numbness.  Given that there is a history of malignancy, this is concerning.  The associated headaches and visual symptoms is suggestive of migraine headaches and/or seizures.  The pituitary looks somewhat unusual.  Given the history of malignancy, MRI with contrast will be obtained.  Spinal fluid analysis will also be obtained.  Further suggestions will depend on the initial work-up.  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  Given the size close to 5 mm, I think this should be addressed with coiling.  Referral can be made to Dr. Debria Garret.   The patient is a 58 year old white female who presents with acute onset of gait ataxia and the vertiginous symptoms a few days ago.  She was in the grocery store when this happened.  She reports spinning-like sensation and gait ataxia leaning towards one side.  She does not report alteration of consciousness or loss of consciousness.  She denies any focal numbness or weakness of the extremities.  She denies dysarthria or dysphasia.  The severe spinning sensation and ataxia lasted for about 10 minutes.  She however reports that she has had numbness involving the left facial region.  It appears that the entire left facial region was involved but she is left with persistent periorbital numbness on the left side.  She is noted to have lower facial weakness.  She thinks this is new when asked directly.  She has had prominent right-sided headaches with these symptoms.  The headaches have been persistent.  Headache has been moderate in intensity.  She does have a remote history of migraine headaches which she had when she was working over 15 years ago but  not recently.  She denies chest pain shortness of breath or urinary symptoms.  She has a history of liver cancer.  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  She is being followed closely at [facility] for this.  The review of systems otherwise negative.    GENERAL: This is a pleasant obese female in no acute distress.  HEENT: Neck is supple and no evidence of trauma.  Dentition poor.  ABDOMEN: soft  EXTREMITIES: No edema; hammertoes are noted  BACK: Normal  SKIN: Normal by inspection.    MENTAL STATUS: Alert and oriented. Speech, language and cognition are generally intact. Judgment and insight normal.   CRANIAL NERVES: Pupils are equal, round and reactive to light and accomodation; extra ocular movements are full, there is no significant nystagmus; visual fields are full; there is mild flattening of the nasolabial fold on the left side.  Otherwise, upper and lower facial muscles are normal. The tongue is midline; uvula is midline; shoulder elevation is normal.  MOTOR: Normal tone, bulk and strength; no pronator drift.  COORDINATION: Left finger to nose is normal, right finger to nose is normal, No rest tremor; no intention tremor; no postural tremor; no bradykinesia.  REFLEXES: Deep tendon reflexes are symmetrical and normal . Plantar reflexes are flexor bilaterally.   SENSATION: Normal to light touch, temperature, and pain.      Blood pressure 121/86, pulse 74, temperature 98.2 F (36.8 C), temperature source Oral, resp. rate 16, height 5'  5" (1.651 m), weight 95.3 kg, SpO2 96 %.  Past Medical History:  Diagnosis Date  . Agoraphobia   . Cancer [facility])    liver    Past Surgical History:  Procedure Laterality Date  . BARIATRIC SURGERY    . CARPAL TUNNEL RELEASE    . TONSILLECTOMY      History reviewed. No pertinent family history.  Social History:  reports that she has been smoking. She has never used smokeless tobacco. She reports that she does not  drink alcohol or use drugs.  Allergies: No Known Allergies  Medications: Prior to Admission medications   Medication Sig Start Date End Date Taking? Authorizing Provider  B-COMPLEX-C PO Take 1 tablet by mouth daily.   Yes [provider]  buPROPion (WELLBUTRIN SR) 150 MG 12 hr tablet Take 150 mg by mouth 2 (two) times daily.   Yes [provider]  celecoxib (CELEBREX) 200 MG capsule Take 200 mg by mouth daily. 5pm   Yes [provider]  clonazePAM (KLONOPIN) 0.5 MG tablet Take 0.5 mg by mouth 2 (two) times daily. 1 tablet in the morning and take 1 tablet at 5p. May take additional half-whole tablet throughout the day as needed for anxiety   Yes [provider]  metoprolol succinate (TOPROL-XL) 25 MG 24 hr tablet Take 12.5 mg by mouth every morning.   Yes [provider]  pantoprazole (PROTONIX) 40 MG tablet Take 40 mg by mouth every morning.   Yes [provider]  polycarbophil (FIBERCON) 625 MG tablet Take 625 mg by mouth 2 (two) times daily.   Yes [provider]  simvastatin (ZOCOR) 20 MG tablet Take 20 mg by mouth every morning.   Yes [provider]  venlafaxine XR (EFFEXOR-XR) 150 MG 24 hr capsule Take 150 mg by mouth 2 (two) times daily.   Yes [provider]    Scheduled Meds: . aspirin  300 mg Rectal Daily   Or  . aspirin  325 mg Oral Daily  . buPROPion  150 mg Oral BID  . celecoxib  200 mg Oral Q1500  . clonazePAM  0.5 mg Oral BID  . enoxaparin (LOVENOX) injection  40 mg Subcutaneous Q24H  . metoprolol succinate  12.5 mg Oral q morning - 10a  . nicotine  21 mg Transdermal Daily  . pantoprazole  40 mg Oral q morning - 10a  . polycarbophil  625 mg Oral BID  . simvastatin  20 mg Oral q morning - 10a  . venlafaxine XR  150 mg Oral Q breakfast   Continuous Infusions: . sodium chloride 75 mL/hr at 03/12/18 0508  . cefTRIAXone (ROCEPHIN)  IV Stopped (03/11/18 1930)   PRN Meds:.acetaminophen **OR**  acetaminophen (TYLENOL) oral liquid 160 mg/5 mL **OR** acetaminophen, clonazePAM, LORazepam, senna-docusate     Results for orders placed or performed during the hospital encounter of 03/10/18 (from the past 48 hour(s))  Ethanol     Status: None   Collection Time: 03/10/18 10:58 AM  Result Value Ref Range   Alcohol, Ethyl (B) <10 <10 mg/dL    Comment: Performed at [facility], [address], [place], [place] [address]  Protime-INR     Status: None   Collection Time: 03/10/18 10:58 AM  Result Value Ref Range   Prothrombin Time 12.8 11.4 - 15.2 seconds   INR 0.97     Comment: Performed at [facility], [address], [place], [place] [address]  APTT     Status: None  Collection Time: 03/10/18 10:58 AM  Result Value Ref Range   aPTT 25 24 - 36 seconds    Comment: Performed at [facility], [address], [place], [place] [address]  CBC     Status: None   Collection Time: 03/10/18 10:58 AM  Result Value Ref Range   WBC 6.5 4.0 - 10.5 K/uL   RBC 4.71 3.87 - 5.11 MIL/uL   Hemoglobin 13.2 12.0 - 15.0 g/dL   HCT 43.3 36.0 - 46.0 %   MCV 91.9 80.0 - 100.0 fL   MCH 28.0 26.0 - 34.0 pg   MCHC 30.5 30.0 - 36.0 g/dL   RDW 13.1 11.5 - 15.5 %   Platelets 217 150 - 400 K/uL   nRBC 0.0 0.0 - 0.2 %    Comment: Performed at [facility], [address], [place], [place] [address]  Differential     Status: None   Collection Time: 03/10/18 10:58 AM  Result Value Ref Range   Neutrophils Relative % 68 %   Neutro Abs 4.5 1.7 - 7.7 K/uL   Lymphocytes Relative 22 %   Lymphs Abs 1.4 0.7 - 4.0 K/uL   Monocytes Relative 5 %   Monocytes Absolute 0.4 0.1 - 1.0 K/uL   Eosinophils Relative 3 %   Eosinophils Absolute 0.2 0.0 - 0.5 K/uL   Basophils Relative 1 %   Basophils Absolute 0.0 0.0 - 0.1 K/uL   Immature Granulocytes 1 %   Abs Immature Granulocytes 0.03 0.00 - 0.07 K/uL    Comment: Performed at [facility], [address], [place], [place] [address]  Comprehensive metabolic panel     Status:  Abnormal   Collection Time: 03/10/18 10:58 AM  Result Value Ref Range   Sodium 139 135 - 145 mmol/L   Potassium 3.9 3.5 - 5.1 mmol/L   Chloride 110 98 - 111 mmol/L   CO2 25 22 - 32 mmol/L   Glucose, Bld 98 70 - 99 mg/dL   BUN 20 6 - 20 mg/dL   Creatinine, Ser 0.97 0.44 - 1.00 mg/dL   Calcium 9.4 8.9 - 10.3 mg/dL   Total Protein 6.9 6.5 - 8.1 g/dL   Albumin 3.8 3.5 - 5.0 g/dL   AST 18 15 - 41 U/L   ALT 17 0 - 44 U/L   Alkaline Phosphatase 101 38 - 126 U/L   Total Bilirubin 0.5 0.3 - 1.2 mg/dL   GFR calc non Af Amer >60 >60 mL/min   GFR calc Af Amer >60 >60 mL/min   Anion gap 4 (L) 5 - 15    Comment: Performed at [facility], [address], [place], [place] [address]  Urine rapid drug screen (hosp performed)     Status: Abnormal   Collection Time: 03/10/18 12:10 PM  Result Value Ref Range   Opiates NONE DETECTED NONE DETECTED   Cocaine NONE DETECTED NONE DETECTED   Benzodiazepines POSITIVE (A) NONE DETECTED   Amphetamines NONE DETECTED NONE DETECTED   Tetrahydrocannabinol NONE DETECTED NONE DETECTED   Barbiturates NONE DETECTED NONE DETECTED    Comment: (NOTE) DRUG SCREEN FOR MEDICAL PURPOSES ONLY.  IF CONFIRMATION IS NEEDED FOR ANY PURPOSE, NOTIFY LAB WITHIN 5 DAYS. LOWEST DETECTABLE LIMITS FOR URINE DRUG SCREEN Drug Class                     Cutoff (ng/mL) Amphetamine and metabolites    1000 Barbiturate and metabolites    200 Benzodiazepine  850 Tricyclics and metabolites     300 Opiates and metabolites        300 Cocaine and metabolites        300 THC                            50 Performed at [facility], [address] [place] [address] [place], [place] [address]   Urinalysis, Routine w reflex microscopic     Status: Abnormal   Collection Time: 03/10/18 12:10 PM  Result Value Ref Range   Color, Urine YELLOW YELLOW   APPearance CLOUDY (A) CLEAR   Specific Gravity, Urine 1.028 1.005 - 1.030   pH 5.0 5.0 - 8.0   Glucose, UA NEGATIVE NEGATIVE mg/dL   Hgb urine  dipstick NEGATIVE NEGATIVE   Bilirubin Urine NEGATIVE NEGATIVE   Ketones, ur NEGATIVE NEGATIVE mg/dL   Protein, ur NEGATIVE NEGATIVE mg/dL   Nitrite NEGATIVE NEGATIVE   Leukocytes, UA MODERATE (A) NEGATIVE   RBC / HPF 0-5 0 - 5 RBC/hpf   WBC, UA 21-50 0 - 5 WBC/hpf   Bacteria, UA RARE (A) NONE SEEN   Squamous Epithelial / LPF 11-20 0 - 5   Mucus PRESENT     Comment: Performed at [facility], [address] [place], [place] [address]  Culture, Urine     Status: Abnormal   Collection Time: 03/10/18  3:10 PM  Result Value Ref Range   Specimen Description      URINE, CLEAN CATCH Performed at [facility], [address] [place], [place] [address]    Special Requests      NONE Performed at [facility], [address] [place], [place] [address]    Culture MULTIPLE SPECIES PRESENT, SUGGEST RECOLLECTION (A)    Report Status 03/12/2018 FINAL   HIV antibody (Routine Testing)     Status: None   Collection Time: 03/10/18  3:58 PM  Result Value Ref Range   HIV Screen 4th Generation wRfx Non Reactive Non Reactive    Comment: (NOTE) Performed At: [facility] [place], [place] [id number] Rush Farmer MD [id number]   Vitamin B12     Status: None   Collection Time: 03/10/18  3:58 PM  Result Value Ref Range   Vitamin B-12 330 180 - 914 pg/mL    Comment: (NOTE) This assay is not validated for testing neonatal or myeloproliferative syndrome specimens for Vitamin B12 levels. Performed at [facility], [address] [place], [place] [address]   VITAMIN D 25 Hydroxy (Vit-D Deficiency, Fractures)     Status: None   Collection Time: 03/10/18  3:58 PM  Result Value Ref Range   Vit D, 25-Hydroxy 46.3 30.0 - 100.0 ng/mL    Comment: (NOTE) Vitamin D deficiency has been defined by the [place] practice guideline as a level of serum 25-OH vitamin D less than 20 ng/mL (1,2). The Endocrine Society went on to further define vitamin  D insufficiency as a level between 21 and 29 ng/mL (2). 1. IOM (Institute of Medicine). 2010. Dietary reference   intakes for calcium and D. [place]: The   [employer]. 2. Holick MF, Binkley [place], Bischoff-Ferrari HA, et al.   Evaluation, treatment, and prevention of vitamin D   deficiency: an Endocrine Society clinical practice   guideline. JCEM. 2011 Jul; 96(7):1911-30. Performed At: [facility] [address] [place], [place] [id number] Rush Farmer MD [id number]   TSH  Status: None   Collection Time: 03/10/18  3:58 PM  Result Value Ref Range   TSH 0.805 0.350 - 4.500 uIU/mL    Comment: Performed by a 3rd Generation assay with a functional sensitivity of <=0.01 uIU/mL. Performed at [facility], [address], [place], [place] [address]   Sedimentation rate     Status: None   Collection Time: 03/10/18  3:58 PM  Result Value Ref Range   Sed Rate 6 0 - 22 mm/hr    Comment: Performed at [facility], [address] [place], [place] [address]  Culture, blood (Routine X 2) w Reflex to ID Panel     Status: None (Preliminary result)   Collection Time: 03/10/18  4:03 PM  Result Value Ref Range   Specimen Description BLOOD RIGHT ARM    Special Requests      BOTTLES DRAWN AEROBIC AND ANAEROBIC Blood Culture adequate volume   Culture      NO GROWTH 2 DAYS Performed at [facility], [address] [place], [place] [address]    Report Status PENDING   Culture, blood (Routine X 2) w Reflex to ID Panel     Status: None (Preliminary result)   Collection Time: 03/10/18  4:09 PM  Result Value Ref Range   Specimen Description BLOOD LEFT HAND    Special Requests      BOTTLES DRAWN AEROBIC AND ANAEROBIC Blood Culture adequate volume   Culture      NO GROWTH 2 DAYS Performed at [facility], [address] [place], [place] [address]    Report Status PENDING   Sedimentation rate     Status: None   Collection Time: 03/11/18  6:22 AM  Result Value Ref  Range   Sed Rate 7 0 - 22 mm/hr    Comment: Performed at [facility], [address], [place], [place] [address]  Hemoglobin A1c     Status: Abnormal   Collection Time: 03/11/18  6:22 AM  Result Value Ref Range   Hgb A1c MFr Bld 4.7 (L) 4.8 - 5.6 %    Comment: (NOTE) Pre diabetes:          5.7%-6.4% Diabetes:              >6.4% Glycemic control for   <7.0% adults with diabetes    Mean Plasma Glucose 88.19 mg/dL    Comment: Performed at [facility] [address], [place], [place] [address]  Lipid panel     Status: Abnormal   Collection Time: 03/11/18  6:24 AM  Result Value Ref Range   Cholesterol 175 0 - 200 mg/dL   Triglycerides 155 (H) <150 mg/dL   HDL 48 >40 mg/dL   Total CHOL/HDL Ratio 3.6 RATIO   VLDL 31 0 - 40 mg/dL   LDL Cholesterol 96 0 - 99 mg/dL    Comment:        Total Cholesterol/HDL:CHD Risk Coronary Heart Disease Risk Table                     Men   Women  1/2 Average Risk   3.4   3.3  Average Risk       5.0   4.4  2 X Average Risk   9.6   7.1  3 X Average Risk  23.4   11.0        Use the calculated Patient Ratio above and the CHD Risk Table to determine the patient's CHD Risk.  ATP III CLASSIFICATION (LDL):  <100     mg/dL   Optimal  100-129  mg/dL   Near or Above                    Optimal  130-159  mg/dL   Borderline  160-189  mg/dL   High  >190     mg/dL   Very High Performed at [facility] [place], [place] [address]   Comprehensive metabolic panel     Status: Abnormal   Collection Time: 03/11/18  8:24 PM  Result Value Ref Range   Sodium 139 135 - 145 mmol/L   Potassium 4.1 3.5 - 5.1 mmol/L   Chloride 113 (H) 98 - 111 mmol/L   CO2 23 22 - 32 mmol/L   Glucose, Bld 77 70 - 99 mg/dL   BUN 22 (H) 6 - 20 mg/dL   Creatinine, Ser 1.03 (H) 0.44 - 1.00 mg/dL   Calcium 8.9 8.9 - 10.3 mg/dL   Total Protein 5.7 (L) 6.5 - 8.1 g/dL   Albumin 3.2 (L) 3.5 - 5.0 g/dL   AST 16 15 - 41 U/L   ALT 14 0 - 44 U/L   Alkaline  Phosphatase 80 38 - 126 U/L   Total Bilirubin 0.3 0.3 - 1.2 mg/dL   GFR calc non Af Amer >60 >60 mL/min   GFR calc Af Amer >60 >60 mL/min   Anion gap 3 (L) 5 - 15    Comment: Performed at [facility], [address], [place], [place] [address]  Magnesium     Status: None   Collection Time: 03/12/18  6:07 AM  Result Value Ref Range   Magnesium 2.0 1.7 - 2.4 mg/dL    Comment: Performed at [facility], [address] [place], [place] [address]  Phosphorus     Status: None   Collection Time: 03/12/18  6:07 AM  Result Value Ref Range   Phosphorus 3.9 2.5 - 4.6 mg/dL    Comment: Performed at [facility], [address] [place], [place] [address]    Studies/Results:  CAROTID DOPPLERS IMPRESSION: Color duplex indicates minimal heterogeneous plaque, with no hemodynamically significant stenosis by duplex criteria in the extracranial cerebrovascular circulation.  Incidental finding of right thyroid nodule measuring 1.8 cm. Dedicated thyroid ultrasound recommended if further characterisation Warranted.      BRAIN MRI MRA FINDINGS: MRI HEAD FINDINGS  Brain: No acute infarction, hemorrhage, hydrocephalus, extra-axial collection or mass lesion. Mild patchy FLAIR hyperintensity in the pons. Even milder periventricular FLAIR hyperintensity. Normal brain volume. Partially empty sella considered incidental in isolation.  Vascular: Arterial findings below. Normal dural venous sinus flow voids.  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 There is a lobulated superiorly directed anterior communicating artery aneurysm measuring 5 mm in width and 3 mm base to dome. No major branch occlusion or flow limiting stenosis. Negative for vessel beading.  IMPRESSION: Brain MRI:  1. No acute finding or specific explanation for headache. 2. Mild signal  abnormality in the pons and periventricular white matter that is usually from chronic small vessel ischemia.  Intracranial MRA:  1. 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 2. No emergent finding.     EEG This is a normal electroencephalogram, awake and with activation procedures. There are no focal lateralizing or epileptiform features.    The  brain MRI is reviewed in person.  No acute changes are noted.  The pituitary seems somewhat full than usual with apparent partial empty sellar also.  There is anterior communicating aneurysm noted.   Canio Winokur A. Merlene Cox, M.D.  Diplomate, Tax adviser of Psychiatry and Neurology ( Neurology). 03/12/2018, 8:13 AM

## 2018-03-12 NOTE — Procedures (Signed)
Preprocedure Dx: Headaches, dizziness, difficulty controlling legs Postprocedure Dx: Headaches, dizziness, difficulty controlling legs Procedure:  Fluoroscopically guided lumbar puncture Radiologist:  Thornton Papas Anesthesia:  3 ml of 1% lidocaine Specimen:  10 ml CSF, clear & colorless EBL:   < 1 ml Opening pressure: (Not assessed) cm O8H Complications: None

## 2018-03-12 NOTE — Progress Notes (Signed)
PROGRESS NOTE    Berniece Abid  HEN:[id number] DOB: [birth year] DOA: 03/10/2018 PCP: System, Pcp Not In    Brief Narrative:  58 year old female with chronic lightheadedness, presents to the hospital with complaints of transient dizziness, diplopia, and numbness on the left side of her face.  Work-up thus far has been unrevealing.  MRI does not show any acute infarct.  Neurology consultation has been requested.   Assessment & Plan:   Principal Problem:   Left facial numbness Active Problems:   Bilateral leg weakness   Tobacco abuse   Cancer (HCC)   Agoraphobia   Anterior communicating artery aneurysm   1. Left facial numbness.  Patient describes transient left facial numbness, transient diplopia and dizziness which has since resolved.  She has noticed some drooling out of the left side of her mouth.  MRI of the brain did not show any evidence of acute infarct.  Echocardiogram and carotid Dopplers also unrevealing.  EEG done does not show any evidence of epileptiform discharges.  Seen by neurology and MRI of the brain with contrast performed that did not show any significant findings.  Further work-up with lumbar puncture has been ordered.  Further recommendations based on the results of the study. 2. Anterior communicating artery aneurysm.  Noted incidentally on MRI. Per neurology, does not appear to be causing her symptoms.  She has been set up to see neuro interventional radiology next week to discuss further options such as coiling. 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 4. Generalized anxiety disorder and Agoraphobia.  Continue on benzodiazepines as needed. 5. Chronic nicotine dependence.  Counseled on the importance of cessation. 6. History of bariatric surgery.  She reports approximately 100 pound weight loss in the past year.   DVT prophylaxis: Lovenox Code Status: Full code Family Communication: Discussed with husband at the bedside Disposition  Plan: Discharge home once neurology work-up is complete   Consultants:   Neurology  Procedures:   TIR:WERX is a normal electroencephalogram, awake and with activation procedures. There are no focal lateralizing or epileptiform features. Echo:- Left ventricle: The cavity size was normal. Wall thickness was   increased in a pattern of mild LVH. Systolic function was normal.   The estimated ejection fraction was in the range of 60% to 65%.   Wall motion was normal; there were no regional wall motion   abnormalities. Findings consistent with left ventricular   diastolic dysfunction, grade indeterminate.  - Atrial septum: No defect or patent foramen ovale was identified.    Lumbar puncture 1/7  Antimicrobials:      Subjective: Continues to complain of some numbness on the left side of her cheek.  Also complains of a chronic headache.  Objective: Vitals:   03/11/18 2140 03/12/18 0340 03/12/18 0628 03/12/18 1340  BP: 111/78 112/80 121/86 135/82  Pulse: 71 80 74 74  Resp: 18 18 16 16   Temp:   98.2 F (36.8 C)   TempSrc:   Oral   SpO2: 96% 96% 96% 97%  Weight:      Height:        Intake/Output Summary (Last 24 hours) at 03/12/2018 1647 Last data filed at 03/12/2018 1100 Gross per 24 hour  Intake 1645.43 ml  Output -  Net 1645.43 ml   Filed Weights   03/10/18 1031  Weight: 95.3 kg    Examination:  General exam: Alert, awake, oriented x 3 Respiratory system: Clear to auscultation. Respiratory effort normal. Cardiovascular system:RRR. No murmurs, rubs, gallops. Gastrointestinal  system: Abdomen is nondistended, soft and nontender. No organomegaly or masses felt. Normal bowel sounds heard. Central nervous system: Alert and oriented. No focal neurological deficits. Extremities: No C/C/E, +pedal pulses Skin: No rashes, lesions or ulcers Psychiatry: Judgement and insight appear normal. Mood & affect appropriate.   Data Reviewed: I have personally reviewed following labs  and imaging studies  CBC: Recent Labs  Lab 03/10/18 1058  WBC 6.5  NEUTROABS 4.5  HGB 13.2  HCT 43.3  MCV 91.9  PLT 099   Basic Metabolic Panel: Recent Labs  Lab 03/10/18 1058 03/11/18 2024 03/12/18 0607  NA 139 139  --   K 3.9 4.1  --   CL 110 113*  --   CO2 25 23  --   GLUCOSE 98 77  --   BUN 20 22*  --   CREATININE 0.97 1.03*  --   CALCIUM 9.4 8.9  --   MG  --   --  2.0  PHOS  --   --  3.9   GFR: Estimated Creatinine Clearance: 70.4 mL/min (A) (by C-G formula based on SCr of 1.03 mg/dL (H)). Liver Function Tests: Recent Labs  Lab 03/10/18 1058 03/11/18 2024  AST 18 16  ALT 17 14  ALKPHOS 101 80  BILITOT 0.5 0.3  PROT 6.9 5.7*  ALBUMIN 3.8 3.2*   No results for input(s): LIPASE, AMYLASE in the last 168 hours. No results for input(s): AMMONIA in the last 168 hours. Coagulation Profile: Recent Labs  Lab 03/10/18 1058  INR 0.97   Cardiac Enzymes: No results for input(s): CKTOTAL, CKMB, CKMBINDEX, TROPONINI in the last 168 hours. BNP (last 3 results) No results for input(s): PROBNP in the last 8760 hours. HbA1C: Recent Labs    03/11/18 0622  HGBA1C 4.7*   CBG: No results for input(s): GLUCAP in the last 168 hours. Lipid Profile: Recent Labs    03/11/18 0624  CHOL 175  HDL 48  LDLCALC 96  TRIG 155*  CHOLHDL 3.6   Thyroid Function Tests: Recent Labs    03/10/18 1558  TSH 0.805   Anemia Panel: Recent Labs    03/10/18 1558  VITAMINB12 330   Sepsis Labs: No results for input(s): PROCALCITON, LATICACIDVEN in the last 168 hours.  Recent Results (from the past 240 hour(s))  Culture, Urine     Status: Abnormal   Collection Time: 03/10/18  3:10 PM  Result Value Ref Range Status   Specimen Description   Final    URINE, CLEAN CATCH Performed at [facility], [address] [place], [place] [address]    Special Requests   Final    NONE Performed at [facility], [address] [place], [place] [address]    Culture MULTIPLE SPECIES  PRESENT, SUGGEST RECOLLECTION (A)  Final   Report Status 03/12/2018 FINAL  Final  Culture, blood (Routine X 2) w Reflex to ID Panel     Status: None (Preliminary result)   Collection Time: 03/10/18  4:03 PM  Result Value Ref Range Status   Specimen Description BLOOD RIGHT ARM  Final   Special Requests   Final    BOTTLES DRAWN AEROBIC AND ANAEROBIC Blood Culture adequate volume   Culture   Final    NO GROWTH 2 DAYS Performed at [facility], [address] [place], [place] [address]    Report Status PENDING  Incomplete  Culture, blood (Routine X 2) w Reflex to ID Panel     Status: None (Preliminary result)  Collection Time: 03/10/18  4:09 PM  Result Value Ref Range Status   Specimen Description BLOOD LEFT HAND  Final   Special Requests   Final    BOTTLES DRAWN AEROBIC AND ANAEROBIC Blood Culture adequate volume   Culture   Final    NO GROWTH 2 DAYS Performed at [facility], [address], [place], [place] [address]    Report Status PENDING  Incomplete  CSF culture     Status: None (Preliminary result)   Collection Time: 03/12/18  2:22 PM  Result Value Ref Range Status   Specimen Description CSF  Final   Special Requests NONE  Final   Gram Stain   Final    CYTOSPIN SMEAR WBC PRESENT, PREDOMINANTLY MONONUCLEAR NO ORGANISMS SEEN Performed at [facility] Performed at [facility], [address], [place], [place] [address]    Culture PENDING  Incomplete   Report Status PENDING  Incomplete         Radiology Studies: Mr Brain 70 Contrast  Result Date: 03/11/2018 CLINICAL DATA:  Headache, new, malignancy suspected. Dizziness and weakness for 5 days EXAM: MRI HEAD WITHOUT CONTRAST MRA HEAD WITHOUT CONTRAST TECHNIQUE: Multiplanar, multiecho pulse sequences of the brain and surrounding structures were obtained without intravenous contrast. Angiographic images of the head were obtained using MRA technique without contrast. COMPARISON:  None. FINDINGS: MRI HEAD FINDINGS Brain:  No acute infarction, hemorrhage, hydrocephalus, extra-axial collection or mass lesion. Mild patchy FLAIR hyperintensity in the pons. Even milder periventricular FLAIR hyperintensity. Normal brain volume. Partially empty sella considered incidental in isolation. Vascular: Arterial findings below. Normal dural venous sinus flow voids. 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 No major branch occlusion or flow limiting stenosis. Negative for vessel beading. IMPRESSION: Brain MRI: 1. No acute finding or specific explanation for headache. 2. Mild signal abnormality in the pons and periventricular white matter that is usually from chronic small vessel ischemia. Intracranial MRA: 1. 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 2. No emergent finding. Electronically Signed   By: Monte Fantasia M.D.   On: 03/11/2018 09:08   Mr Brain W Contrast  Result Date: 03/12/2018 CLINICAL DATA:  Liver cancer. Headache. Rule out metastatic disease. EXAM: MRI HEAD WITH CONTRAST TECHNIQUE: Multiplanar, multiecho pulse sequences of the brain and surrounding structures were obtained with intravenous contrast. CONTRAST:  10 mL Gadovist IV COMPARISON:  Unenhanced MRI head 03/11/2018 FINDINGS: Normal enhancement. No enhancing mass lesion. Leptomeningeal enhancement normal. No acute abnormality. See recent unenhanced MRI report. IMPRESSION: No acute abnormality and negative for intracranial metastatic disease. Electronically Signed   By: Franchot Gallo M.D.   On: 03/12/2018 11:16   US Carotid Bilateral (at [facility] And [facility] Only)  Result Date: 03/11/2018 CLINICAL DATA:  58 year old female with history of vertigo EXAM: BILATERAL CAROTID DUPLEX ULTRASOUND TECHNIQUE: Pearline Cables scale imaging, color  Doppler and duplex ultrasound were performed of bilateral carotid and vertebral arteries in the neck. COMPARISON:  None. FINDINGS: Criteria: Quantification of carotid stenosis is based on velocity parameters that correlate the residual internal carotid diameter with NASCET-based stenosis levels, using the diameter of the distal internal carotid lumen as the denominator for stenosis measurement. The following velocity measurements were obtained: RIGHT ICA:  Systolic 91 cm/sec, Diastolic 34 cm/sec CCA:  009  cm/sec SYSTOLIC ICA/CCA RATIO:  0.9 ECA:  96 cm/sec LEFT ICA:  Systolic 78 cm/sec, Diastolic 38 cm/sec CCA:  93 cm/sec SYSTOLIC ICA/CCA RATIO:  0.8 ECA:  77 cm/sec Right Brachial SBP: Not acquired Left Brachial SBP: Not acquired RIGHT CAROTID ARTERY: No significant calcified disease of the right common carotid artery. Intermediate waveform maintained. Heterogeneous plaque without significant calcifications at the right carotid bifurcation. Low resistance waveform of the right ICA. No significant tortuosity. RIGHT VERTEBRAL ARTERY: Antegrade flow with low resistance waveform. LEFT CAROTID ARTERY: No significant calcified disease of the left common carotid artery. Intermediate waveform maintained. Heterogeneous plaque at the left carotid bifurcation without significant calcifications. Low resistance waveform of the left ICA. LEFT VERTEBRAL ARTERY:  Antegrade flow with low resistance waveform. Additional: Right thyroid nodule measures 1.8 cm. IMPRESSION: Color duplex indicates minimal heterogeneous plaque, with no hemodynamically significant stenosis by duplex criteria in the extracranial cerebrovascular circulation. Incidental finding of right thyroid nodule measuring 1.8 cm. Dedicated thyroid ultrasound recommended if further characterisation warranted. Signed, Dulcy Fanny. Dellia Nims, RPVI [facility] Radiology Electronically Signed   By: Corrie Mckusick D.O.   On:  03/11/2018 09:41   Dg Fluoro Guided Needle Plc Aspiration/injection Loc  Result Date: 03/12/2018 CLINICAL DATA:  Headaches, dizziness, difficulty controlling legs EXAM: DIAGNOSTIC LUMBAR PUNCTURE UNDER FLUOROSCOPIC GUIDANCE FLUOROSCOPY TIME:  Fluoroscopy Time:  0 minutes 12 seconds Radiation Exposure Index (if provided by the fluoroscopic device): 3.4 mGy Number of Acquired Spot Images: 2 PROCEDURE: Procedure, benefits, and risks were discussed with the patient, including alternatives. Patient's questions were answered. Written informed consent was obtained. Timeout protocol followed. Patient placed prone. L4-L5 disc space was localized under fluoroscopy. Skin prepped and draped in usual sterile fashion. Skin and soft tissues anesthetized with 3 mL of 1% lidocaine. 22 gauge needle was advanced into the spinal canal where clear colorless CSF was encountered. 10 mL of CSF was obtained in 4 tubes for requested analysis. Procedure tolerated very well by patient without immediate complication. IMPRESSION: Fluoroscopic guided lumbar puncture as above. Electronically Signed   By: Lavonia Dana M.D.   On: 03/12/2018 15:08   Mr Jodene Nam Head/brain EZ Cm  Result Date: 03/11/2018 CLINICAL DATA:  Headache, new, malignancy suspected. Dizziness and weakness for 5 days EXAM: MRI HEAD WITHOUT CONTRAST MRA HEAD WITHOUT CONTRAST TECHNIQUE: Multiplanar, multiecho pulse sequences of the brain and surrounding structures were obtained without intravenous contrast. Angiographic images of the head were obtained using MRA technique without contrast. COMPARISON:  None. FINDINGS: MRI HEAD FINDINGS Brain: No acute infarction, hemorrhage, hydrocephalus, extra-axial collection or mass lesion. Mild patchy FLAIR hyperintensity in the pons. Even milder periventricular FLAIR hyperintensity. Normal brain volume. Partially empty sella considered incidental in isolation. Vascular: Arterial findings below. Normal dural venous sinus flow voids. 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 There is a lobulated superiorly directed anterior communicating artery aneurysm measuring 5 mm in width and 3 mm base to dome. No major branch occlusion or flow limiting stenosis. Negative for vessel beading. IMPRESSION: Brain MRI: 1. No acute finding or specific explanation for headache. 2. Mild signal abnormality in the pons and periventricular white matter that is usually from chronic small vessel ischemia. Intracranial MRA: 1. 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 2. No emergent finding. Electronically Signed   By: Monte Fantasia M.D.   On: 03/11/2018 09:08  Scheduled Meds: . aspirin  300 mg Rectal Daily   Or  . aspirin  325 mg Oral Daily  . buPROPion  150 mg Oral BID  . celecoxib  200 mg Oral Q1500  . clonazePAM  0.5 mg Oral BID  . enoxaparin (LOVENOX) injection  40 mg Subcutaneous Q24H  . metoprolol succinate  12.5 mg Oral q morning - 10a  . nicotine  21 mg Transdermal Daily  . pantoprazole  40 mg Oral q morning - 10a  . polycarbophil  625 mg Oral BID  . simvastatin  20 mg Oral q morning - 10a  . venlafaxine XR  150 mg Oral Q breakfast   Continuous Infusions: . sodium chloride 75 mL/hr at 03/12/18 0508     LOS: 0 days    Time spent: 34mins    Kathie Dike, MD [facility] Pager [phone]  If 7PM-7AM, please contact night-coverage [email or web address] Password [facility] 03/12/2018, 4:47 PM

## 2018-03-13 DIAGNOSIS — C801 Malignant (primary) neoplasm, unspecified: Secondary | ICD-10-CM | POA: Diagnosis not present

## 2018-03-13 DIAGNOSIS — R51 Headache: Secondary | ICD-10-CM

## 2018-03-13 DIAGNOSIS — I671 Cerebral aneurysm, nonruptured: Secondary | ICD-10-CM | POA: Diagnosis not present

## 2018-03-13 LAB — HSV DNA BY PCR (REFERENCE LAB)
HSV 1 DNA: NEGATIVE
HSV 2 DNA: NEGATIVE

## 2018-03-13 LAB — VDRL, CSF: VDRL Quant, CSF: NONREACTIVE

## 2018-03-13 NOTE — Discharge Summary (Signed)
Physician Discharge Summary  Rebecca Cox LOV:[id number] DOB: May 13, 1960 DOA: 03/10/2018  PCP: System, Pcp Not In  Admit date: 03/10/2018 Discharge date: 03/13/2018  Admitted From: Home Disposition: Home  Recommendations for Outpatient Follow-up:  1. Follow up with PCP in 1-2 weeks 2. Please obtain BMP/CBC in one week 3. Outpatient appointment arrange for neuro interventional radiology to address aneurysm 4. Follow-up with neurology in the next 2 weeks to follow-up on spinal fluid studies  Discharge Condition: Stable CODE STATUS: Full code Diet recommendation: Heart healthy  Brief/Interim Summary: 58 year old female with a history of hemangioendothelioma of the liver followed at [facility], previous gastric sleeve surgery, was admitted to the hospital with dizziness, numbness on the left side of her face and transient diplopia.  Symptoms lasted approximately 10 minutes.  She was admitted to the hospital for further evaluation.  MRI imaging of the brain did not show any acute infarct.  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  She was seen by neurology who felt that this was not contributing to her presentation.  She also underwent EEG that was an unremarkable study.  After seeing neurology, she underwent MRI of the brain with contrast that did not show any evidence of brain mets.  She also underwent lumbar puncture with several spinal studies ordered including cytology.  Initial spinal fluid studies were unrevealing.  Patient is feeling significantly better.  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  Arrangements have been made for her to follow-up with neuro interventional radiology to discuss management of cerebral aneurysm.  Patient is otherwise stable for discharge.  Discharge Diagnoses:  Principal Problem:   Left facial numbness Active Problems:    Bilateral leg weakness   Tobacco abuse   Cancer [facility])   Agoraphobia   Anterior communicating artery aneurysm    Discharge Instructions  Discharge Instructions    Diet - low sodium heart healthy   Complete by:  As directed    Increase activity slowly   Complete by:  As directed      Allergies as of 03/13/2018   No Known Allergies     Medication List    TAKE these medications   B-COMPLEX-C PO Take 1 tablet by mouth daily.   buPROPion 150 MG 12 hr tablet Commonly known as:  WELLBUTRIN SR Take 150 mg by mouth 2 (two) times daily.   celecoxib 200 MG capsule Commonly known as:  CELEBREX Take 200 mg by mouth daily. 5pm   clonazePAM 0.5 MG tablet Commonly known as:  KLONOPIN Take 0.5 mg by mouth 2 (two) times daily. 1 tablet in the morning and take 1 tablet at 5p. May take additional half-whole tablet throughout the day as needed for anxiety   metoprolol succinate 25 MG 24 hr tablet Commonly known as:  TOPROL-XL Take 12.5 mg by mouth every morning.   pantoprazole 40 MG tablet Commonly known as:  PROTONIX Take 40 mg by mouth every morning.   polycarbophil 625 MG tablet Commonly known as:  FIBERCON Take 625 mg by mouth 2 (two) times daily.   simvastatin 20 MG tablet Commonly known as:  ZOCOR Take 20 mg by mouth every morning.   venlafaxine XR 150 MG 24 hr capsule Commonly known as:  EFFEXOR-XR Take 150 mg by mouth 2 (two) times daily.      Follow-up Information    Luanne Bras, MD Follow up on 03/19/2018.  Specialties:  Interventional Radiology, Radiology Why:  12:00pm Please come to [facility] Radiology Department  on 1st floor This appointment is for further evaluation fo your aneurysm Contact information: [place] [address] [phone]        Phillips Odor, MD. Schedule an appointment as soon as possible for a visit in 2 week(s).   Specialty:  Neurology Contact information: [address] [place]  [address] [phone]          No Known Allergies  Consultations:  Neurology   Procedures/Studies: Ct Head Wo Contrast  Result Date: 03/10/2018 CLINICAL DATA:  Acute onset dizziness and difficulty walking today. EXAM: CT HEAD WITHOUT CONTRAST TECHNIQUE: Contiguous axial images were obtained from the base of the skull through the vertex without intravenous contrast. COMPARISON:  None. FINDINGS: Brain: No evidence of acute infarction, hemorrhage, hydrocephalus, extra-axial collection or mass lesion/mass effect. Vascular: No hyperdense vessel or unexpected calcification. Skull: Normal. Negative for fracture or focal lesion. Sinuses/Orbits: Negative. Other: None. IMPRESSION: Negative head CT. Electronically Signed   By: Inge Rise M.D.   On: 03/10/2018 12:09   Mr Brain Wo Contrast  Result Date: 03/11/2018 CLINICAL DATA:  Headache, new, malignancy suspected. Dizziness and weakness for 5 days EXAM: MRI HEAD WITHOUT CONTRAST MRA HEAD WITHOUT CONTRAST TECHNIQUE: Multiplanar, multiecho pulse sequences of the brain and surrounding structures were obtained without intravenous contrast. Angiographic images of the head were obtained using MRA technique without contrast. COMPARISON:  None. FINDINGS: MRI HEAD FINDINGS Brain: No acute infarction, hemorrhage, hydrocephalus, extra-axial collection or mass lesion. Mild patchy FLAIR hyperintensity in the pons. Even milder periventricular FLAIR hyperintensity. Normal brain volume. Partially empty sella considered incidental in isolation. Vascular: Arterial findings below. Normal dural venous sinus flow voids. 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 No major branch occlusion or flow limiting stenosis. Negative for  vessel beading. IMPRESSION: Brain MRI: 1. No acute finding or specific explanation for headache. 2. Mild signal abnormality in the pons and periventricular white matter that is usually from chronic small vessel ischemia. Intracranial MRA: 1. 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 2. No emergent finding. Electronically Signed   By: Monte Fantasia M.D.   On: 03/11/2018 09:08   Mr Brain W Contrast  Result Date: 03/12/2018 CLINICAL DATA:  Liver cancer. Headache. Rule out metastatic disease. EXAM: MRI HEAD WITH CONTRAST TECHNIQUE: Multiplanar, multiecho pulse sequences of the brain and surrounding structures were obtained with intravenous contrast. CONTRAST:  10 mL Gadovist IV COMPARISON:  Unenhanced MRI head 03/11/2018 FINDINGS: Normal enhancement. No enhancing mass lesion. Leptomeningeal enhancement normal. No acute abnormality. See recent unenhanced MRI report. IMPRESSION: No acute abnormality and negative for intracranial metastatic disease. Electronically Signed   By: Franchot Gallo M.D.   On: 03/12/2018 11:16   US Carotid Bilateral (at [facility] Only)  Result Date: 03/11/2018 CLINICAL DATA:  58 year old female with history of vertigo EXAM: BILATERAL CAROTID DUPLEX ULTRASOUND TECHNIQUE: Pearline Cables scale imaging, color Doppler and duplex ultrasound were performed of bilateral carotid and vertebral arteries in the neck. COMPARISON:  None. FINDINGS: Criteria: Quantification of carotid stenosis is based on velocity parameters that correlate the residual internal carotid diameter with NASCET-based stenosis levels, using the diameter of the distal internal carotid lumen  as the denominator for stenosis measurement. The following velocity measurements were obtained: RIGHT ICA:  Systolic 91 cm/sec, Diastolic 34 cm/sec CCA:  657 cm/sec SYSTOLIC ICA/CCA RATIO:  0.9 ECA:  96 cm/sec LEFT ICA:  Systolic 78 cm/sec, Diastolic 38 cm/sec CCA:  93 cm/sec SYSTOLIC ICA/CCA RATIO:  0.8 ECA:  77 cm/sec Right Brachial SBP: Not  acquired Left Brachial SBP: Not acquired RIGHT CAROTID ARTERY: No significant calcified disease of the right common carotid artery. Intermediate waveform maintained. Heterogeneous plaque without significant calcifications at the right carotid bifurcation. Low resistance waveform of the right ICA. No significant tortuosity. RIGHT VERTEBRAL ARTERY: Antegrade flow with low resistance waveform. LEFT CAROTID ARTERY: No significant calcified disease of the left common carotid artery. Intermediate waveform maintained. Heterogeneous plaque at the left carotid bifurcation without significant calcifications. Low resistance waveform of the left ICA. LEFT VERTEBRAL ARTERY:  Antegrade flow with low resistance waveform. Additional: Right thyroid nodule measures 1.8 cm. IMPRESSION: Color duplex indicates minimal heterogeneous plaque, with no hemodynamically significant stenosis by duplex criteria in the extracranial cerebrovascular circulation. Incidental finding of right thyroid nodule measuring 1.8 cm. Dedicated thyroid ultrasound recommended if further characterisation warranted. Signed, Dulcy Fanny. Dellia Nims, RPVI [facility] Radiology Electronically Signed   By: Corrie Mckusick D.O.   On: 03/11/2018 09:41   Dg Fluoro Guided Needle Plc Aspiration/injection Loc  Result Date: 03/12/2018 CLINICAL DATA:  Headaches, dizziness, difficulty controlling legs EXAM: DIAGNOSTIC LUMBAR PUNCTURE UNDER FLUOROSCOPIC GUIDANCE FLUOROSCOPY TIME:  Fluoroscopy Time:  0 minutes 12 seconds Radiation Exposure Index (if provided by the fluoroscopic device): 3.4 mGy Number of Acquired Spot Images: 2 PROCEDURE: Procedure, benefits, and risks were discussed with the patient, including alternatives. Patient's questions were answered. Written informed consent was obtained. Timeout protocol followed. Patient placed prone. L4-L5 disc space was localized under fluoroscopy. Skin prepped and draped in usual  sterile fashion. Skin and soft tissues anesthetized with 3 mL of 1% lidocaine. 22 gauge needle was advanced into the spinal canal where clear colorless CSF was encountered. 10 mL of CSF was obtained in 4 tubes for requested analysis. Procedure tolerated very well by patient without immediate complication. IMPRESSION: Fluoroscopic guided lumbar puncture as above. Electronically Signed   By: Lavonia Dana M.D.   On: 03/12/2018 15:08   Mr Jodene Nam Head/brain QI Cm  Result Date: 03/11/2018 CLINICAL DATA:  Headache, new, malignancy suspected. Dizziness and weakness for 5 days EXAM: MRI HEAD WITHOUT CONTRAST MRA HEAD WITHOUT CONTRAST TECHNIQUE: Multiplanar, multiecho pulse sequences of the brain and surrounding structures were obtained without intravenous contrast. Angiographic images of the head were obtained using MRA technique without contrast. COMPARISON:  None. FINDINGS: MRI HEAD FINDINGS Brain: No acute infarction, hemorrhage, hydrocephalus, extra-axial collection or mass lesion. Mild patchy FLAIR hyperintensity in the pons. Even milder periventricular FLAIR hyperintensity. Normal brain volume. Partially empty sella considered incidental in isolation. Vascular: Arterial findings below. Normal dural venous sinus flow voids. 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 No major branch occlusion or flow limiting stenosis. Negative for vessel beading. IMPRESSION: Brain MRI: 1. No acute finding or specific explanation for headache. 2. Mild signal abnormality in the pons and periventricular white matter that is usually from chronic small vessel ischemia. Intracranial MRA: 1. 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 2. No emergent finding. Electronically Signed   By: Monte Fantasia M.D.   On: 03/11/2018 09:08       Subjective: Feeling better today.  No further headache.  Discharge Exam: Vitals:   03/12/18 2057 03/12/18 2108 03/13/18 0400 03/13/18 0526  BP:  123/79 132/80 109/74  Pulse:  74 78 76  Resp:    15  Temp:  98.3 F (36.8 C) 98.8 F (37.1 C) 97.9 F (36.6 C)  TempSrc:  Oral Oral Oral  SpO2: 96% 98% 98% 99%  Weight:      Height:        General: Pt is alert, awake, not in acute distress Cardiovascular: RRR, S1/S2 +, no rubs, no gallops Respiratory: CTA bilaterally, no wheezing, no rhonchi Abdominal: Soft, NT, ND, bowel sounds + Extremities: no edema, no cyanosis    The results of significant diagnostics from this hospitalization (including imaging, microbiology, ancillary and laboratory) are listed below for reference.     Microbiology: Recent Results (from the past 240 hour(s))  Culture, Urine     Status: Abnormal   Collection Time: 03/10/18  3:10 PM  Result Value Ref Range Status   Specimen Description   Final    URINE, CLEAN CATCH Performed at [facility], [address] [place], [place] [address]    Special Requests   Final    NONE Performed at [facility], [address] [place], [place] [address]    Culture MULTIPLE SPECIES PRESENT, SUGGEST RECOLLECTION (A)  Final   Report Status 03/12/2018 FINAL  Final  Culture, blood (Routine X 2) w Reflex to ID Panel     Status: None (Preliminary result)   Collection Time: 03/10/18  4:03 PM  Result Value Ref Range Status   Specimen Description BLOOD RIGHT ARM  Final   Special Requests   Final    BOTTLES DRAWN AEROBIC AND ANAEROBIC Blood Culture adequate volume   Culture   Final    NO GROWTH 3 DAYS Performed at [facility], [address], [place], [place] [address]    Report Status PENDING  Incomplete  Culture, blood (Routine X 2) w Reflex to ID Panel     Status: None (Preliminary result)   Collection Time: 03/10/18  4:09 PM  Result Value Ref Range Status   Specimen  Description BLOOD LEFT HAND  Final   Special Requests   Final    BOTTLES DRAWN AEROBIC AND ANAEROBIC Blood Culture adequate volume   Culture   Final    NO GROWTH 3 DAYS Performed at [facility], [address], [place], [place] [address]    Report Status PENDING  Incomplete  CSF culture     Status: None (Preliminary result)   Collection Time: 03/12/18  2:22 PM  Result Value Ref Range Status   Specimen Description   Final    CSF Performed at [facility], [address], [place], [place] [address]    Special Requests   Final    NONE Performed at [facility], [address], [place], [place] [address]    Gram Stain   Final    CYTOSPIN SMEAR WBC PRESENT, PREDOMINANTLY MONONUCLEAR NO ORGANISMS SEEN Performed at [facility] Performed at [facility], [address] [place], [place] [address]    Culture   Final    NO GROWTH < 12 HOURS Performed at [facility], [place] [address], [place], [place] [address]    Report Status PENDING  Incomplete  Labs: BNP (last 3 results) No results for input(s): BNP in the last 8760 hours. Basic Metabolic Panel: Recent Labs  Lab 03/10/18 1058 03/11/18 2024 03/12/18 0607  NA 139 139  --   K 3.9 4.1  --   CL 110 113*  --   CO2 25 23  --   GLUCOSE 98 77  --   BUN 20 22*  --   CREATININE 0.97 1.03*  --   CALCIUM 9.4 8.9  --   MG  --   --  2.0  PHOS  --   --  3.9   Liver Function Tests: Recent Labs  Lab 03/10/18 1058 03/11/18 2024  AST 18 16  ALT 17 14  ALKPHOS 101 80  BILITOT 0.5 0.3  PROT 6.9 5.7*  ALBUMIN 3.8 3.2*   No results for input(s): LIPASE, AMYLASE in the last 168 hours. No results for input(s): AMMONIA in the last 168 hours. CBC: Recent Labs  Lab 03/10/18 1058  WBC 6.5  NEUTROABS 4.5  HGB 13.2  HCT 43.3  MCV 91.9  PLT 217   Cardiac Enzymes: No results for input(s): CKTOTAL, CKMB, CKMBINDEX, TROPONINI in the last 168 hours. BNP: Invalid input(s): POCBNP CBG: No results for input(s): GLUCAP  in the last 168 hours. D-Dimer No results for input(s): DDIMER in the last 72 hours. Hgb A1c Recent Labs    03/11/18 0622  HGBA1C 4.7*   Lipid Profile Recent Labs    03/11/18 0624  CHOL 175  HDL 48  LDLCALC 96  TRIG 155*  CHOLHDL 3.6   Thyroid function studies No results for input(s): TSH, T4TOTAL, T3FREE, THYROIDAB in the last 72 hours.  Invalid input(s): FREET3 Anemia work up No results for input(s): VITAMINB12, FOLATE, FERRITIN, TIBC, IRON, RETICCTPCT in the last 72 hours. Urinalysis    Component Value Date/Time   COLORURINE YELLOW 03/10/2018 1210   APPEARANCEUR CLOUDY (A) 03/10/2018 1210   LABSPEC 1.028 03/10/2018 1210   PHURINE 5.0 03/10/2018 1210   GLUCOSEU NEGATIVE 03/10/2018 1210   HGBUR NEGATIVE 03/10/2018 1210   BILIRUBINUR NEGATIVE 03/10/2018 1210   KETONESUR NEGATIVE 03/10/2018 1210   PROTEINUR NEGATIVE 03/10/2018 1210   NITRITE NEGATIVE 03/10/2018 1210   LEUKOCYTESUR MODERATE (A) 03/10/2018 1210   Sepsis Labs Invalid input(s): PROCALCITONIN,  WBC,  LACTICIDVEN Microbiology Recent Results (from the past 240 hour(s))  Culture, Urine     Status: Abnormal   Collection Time: 03/10/18  3:10 PM  Result Value Ref Range Status   Specimen Description   Final    URINE, CLEAN CATCH Performed at [facility], [address] [place], [place] [address]    Special Requests   Final    NONE Performed at [facility], [address] [place], [place] [address]    Culture MULTIPLE SPECIES PRESENT, SUGGEST RECOLLECTION (A)  Final   Report Status 03/12/2018 FINAL  Final  Culture, blood (Routine X 2) w Reflex to ID Panel     Status: None (Preliminary result)   Collection Time: 03/10/18  4:03 PM  Result Value Ref Range Status   Specimen Description BLOOD RIGHT ARM  Final   Special Requests   Final    BOTTLES DRAWN AEROBIC AND ANAEROBIC Blood Culture adequate volume   Culture   Final    NO GROWTH 3 DAYS Performed at [facility], [address], [place], [place]  [address]    Report Status PENDING  Incomplete  Culture, blood (Routine X 2) w Reflex to ID Panel  Status: None (Preliminary result)   Collection Time: 03/10/18  4:09 PM  Result Value Ref Range Status   Specimen Description BLOOD LEFT HAND  Final   Special Requests   Final    BOTTLES DRAWN AEROBIC AND ANAEROBIC Blood Culture adequate volume   Culture   Final    NO GROWTH 3 DAYS Performed at [facility], [address] [place], [place] [address]    Report Status PENDING  Incomplete  CSF culture     Status: None (Preliminary result)   Collection Time: 03/12/18  2:22 PM  Result Value Ref Range Status   Specimen Description   Final    CSF Performed at [facility], [address] [place], [place] [address]    Special Requests   Final    NONE Performed at [facility], [address], [place], [place] [address]    Gram Stain   Final    CYTOSPIN SMEAR WBC PRESENT, PREDOMINANTLY MONONUCLEAR NO ORGANISMS SEEN Performed at [facility] Performed at [facility], [address], [place], [place] [address]    Culture   Final    NO GROWTH < 12 HOURS Performed at [facility], [place] [address], [place], [place] [address]    Report Status PENDING  Incomplete     Time coordinating discharge: 38mins  SIGNED:   Kathie Dike, MD  [facility] 03/13/2018, 6:51 PM Pager   If 7PM-7AM, please contact night-coverage [email or web address] Password [email or web address]

## 2018-03-13 NOTE — Plan of Care (Signed)
  Problem: Education: Goal: Knowledge of General Education information will improve Description Including pain rating scale, medication(s)/side effects and non-pharmacologic comfort measures Outcome: Progressing   Problem: Health Behavior/Discharge Planning: Goal: Ability to manage health-related needs will improve Outcome: Progressing   Problem: Clinical Measurements: Goal: Ability to maintain clinical measurements within normal limits will improve Outcome: Progressing Goal: Will remain free from infection Outcome: Progressing Goal: Diagnostic test results will improve Outcome: Progressing Goal: Respiratory complications will improve Outcome: Progressing Goal: Cardiovascular complication will be avoided Outcome: Progressing   Problem: Activity: Goal: Risk for activity intolerance will decrease Outcome: Progressing   Problem: Nutrition: Goal: Adequate nutrition will be maintained Outcome: Progressing   Problem: Coping: Goal: Level of anxiety will decrease Outcome: Progressing   Problem: Elimination: Goal: Will not experience complications related to bowel motility Outcome: Progressing Goal: Will not experience complications related to urinary retention Outcome: Progressing   Problem: Pain Managment: Goal: General experience of comfort will improve Outcome: Progressing   Problem: Safety: Goal: Ability to remain free from injury will improve Outcome: Progressing   Problem: Skin Integrity: Goal: Risk for impaired skin integrity will decrease Outcome: Progressing   Problem: Education: Goal: Knowledge of secondary prevention will improve Outcome: Progressing Goal: Knowledge of patient specific risk factors addressed and post discharge goals established will improve Outcome: Progressing   

## 2018-03-13 NOTE — Plan of Care (Signed)
Problem: Education: Goal: Knowledge of General Education information will improve Description Including pain rating scale, medication(s)/side effects and non-pharmacologic comfort measures 03/13/2018 1237 by Rance Muir, RN Outcome: Adequate for Discharge 03/13/2018 1049 by Rance Muir, RN Outcome: Progressing 03/13/2018 1047 by Rance Muir, RN Outcome: Progressing   Problem: Health Behavior/Discharge Planning: Goal: Ability to manage health-related needs will improve 03/13/2018 1237 by Rance Muir, RN Outcome: Adequate for Discharge 03/13/2018 1049 by Rance Muir, RN Outcome: Progressing 03/13/2018 1047 by Rance Muir, RN Outcome: Progressing   Problem: Clinical Measurements: Goal: Ability to maintain clinical measurements within normal limits will improve 03/13/2018 1237 by Rance Muir, RN Outcome: Adequate for Discharge 03/13/2018 1049 by Rance Muir, RN Outcome: Progressing 03/13/2018 1047 by Rance Muir, RN Outcome: Progressing Goal: Will remain free from infection 03/13/2018 1237 by Rance Muir, RN Outcome: Adequate for Discharge 03/13/2018 1049 by Rance Muir, RN Outcome: Progressing 03/13/2018 1047 by Rance Muir, RN Outcome: Progressing Goal: Diagnostic test results will improve 03/13/2018 1237 by Rance Muir, RN Outcome: Adequate for Discharge 03/13/2018 1049 by Rance Muir, RN Outcome: Progressing 03/13/2018 1047 by Rance Muir, RN Outcome: Progressing Goal: Respiratory complications will improve 03/13/2018 1237 by Rance Muir, RN Outcome: Adequate for Discharge 03/13/2018 1049 by Rance Muir, RN Outcome: Progressing 03/13/2018 1047 by Rance Muir, RN Outcome: Progressing Goal: Cardiovascular complication will be avoided 03/13/2018 1237 by Rance Muir, RN Outcome: Adequate for Discharge 03/13/2018 1049 by Rance Muir, RN Outcome: Progressing 03/13/2018 1047 by Rance Muir, RN Outcome: Progressing   Problem: Activity: Goal: Risk for activity intolerance will decrease 03/13/2018 1237 by Rance Muir, RN Outcome: Adequate for  Discharge 03/13/2018 1049 by Rance Muir, RN Outcome: Progressing 03/13/2018 1047 by Rance Muir, RN Outcome: Progressing   Problem: Nutrition: Goal: Adequate nutrition will be maintained 03/13/2018 1237 by Rance Muir, RN Outcome: Adequate for Discharge 03/13/2018 1049 by Rance Muir, RN Outcome: Progressing 03/13/2018 1047 by Rance Muir, RN Outcome: Progressing   Problem: Coping: Goal: Level of anxiety will decrease 03/13/2018 1237 by Rance Muir, RN Outcome: Adequate for Discharge 03/13/2018 1049 by Rance Muir, RN Outcome: Progressing 03/13/2018 1047 by Rance Muir, RN Outcome: Progressing   Problem: Elimination: Goal: Will not experience complications related to bowel motility 03/13/2018 1237 by Rance Muir, RN Outcome: Adequate for Discharge 03/13/2018 1049 by Rance Muir, RN Outcome: Progressing 03/13/2018 1047 by Rance Muir, RN Outcome: Progressing Goal: Will not experience complications related to urinary retention 03/13/2018 1237 by Rance Muir, RN Outcome: Adequate for Discharge 03/13/2018 1049 by Rance Muir, RN Outcome: Progressing 03/13/2018 1047 by Rance Muir, RN Outcome: Progressing   Problem: Pain Managment: Goal: General experience of comfort will improve 03/13/2018 1237 by Rance Muir, RN Outcome: Adequate for Discharge 03/13/2018 1049 by Rance Muir, RN Outcome: Progressing 03/13/2018 1047 by Rance Muir, RN Outcome: Progressing   Problem: Safety: Goal: Ability to remain free from injury will improve 03/13/2018 1237 by Rance Muir, RN Outcome: Adequate for Discharge 03/13/2018 1049 by Rance Muir, RN Outcome: Progressing 03/13/2018 1047 by Rance Muir, RN Outcome: Progressing   Problem: Skin Integrity: Goal: Risk for impaired skin integrity will decrease 03/13/2018 1237 by Rance Muir, RN Outcome: Adequate for Discharge 03/13/2018 1049 by Rance Muir, RN Outcome: Progressing 03/13/2018 1047 by Rance Muir, RN Outcome: Progressing   Problem: Education: Goal: Knowledge of secondary prevention will improve 03/13/2018  1237 by Rance Muir, RN Outcome: Adequate for Discharge 03/13/2018 1049 by Rance Muir, RN Outcome: Progressing 03/13/2018 1047 by Rance Muir, RN Outcome: Progressing Goal: Knowledge of patient specific risk  factors addressed and post discharge goals established will improve 03/13/2018 1237 by Rance Muir, RN Outcome: Adequate for Discharge 03/13/2018 1049 by Rance Muir, RN Outcome: Progressing 03/13/2018 1047 by Rance Muir, RN Outcome: Progressing

## 2018-03-13 NOTE — Progress Notes (Signed)
Talpa A. Merlene Laughter, MD     [email or web address]          [place] Cox is an 58 y.o. female.   Assessment/Plan: 1.  Multiple symptoms without clear unifying diagnosis.    The repeat MRI with contrast shows no abnormal enhancement.  CSF analysis so far is unrevealing but the final results are pending particularly the cytology.  Likely okay for discharge today with follow-up in the next couple weeks to go over lab results and cytology.  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  Given the size close to 5 mm, I think this should be addressed with coiling.  Referral can be made to Dr. Debria Garret.    GENERAL: This is a pleasant obese female in no acute distress.  HEENT: Neck is supple and no evidence of trauma.  Dentition poor.  ABDOMEN: soft  EXTREMITIES: No edema; hammertoes are noted  BACK: Normal  SKIN: Normal by inspection.    MENTAL STATUS: Alert and oriented. Speech, language and cognition are generally intact. Judgment and insight normal.   CRANIAL NERVES: Pupils are equal, round and reactive to light and accomodation; extra ocular movements are full, there is no significant nystagmus; visual fields are full; there is mild flattening of the nasolabial fold on the left side.  Otherwise, upper and lower facial muscles are normal. The tongue is midline; uvula is midline; shoulder elevation is normal.  MOTOR: Normal tone, bulk and strength; no pronator drift.  COORDINATION: Left finger to nose is normal, right finger to nose is normal, No rest tremor; no intention tremor; no postural tremor; no bradykinesia.  REFLEXES: Deep tendon reflexes are symmetrical and normal . Plantar reflexes are flexor bilaterally.   SENSATION: Normal to light touch,  temperature, and pain.    Objective: Vital signs in last 24 hours: Temp:  [97.9 F (36.6 C)-98.8 F (37.1 C)] 97.9 F (36.6 C) (01/08 0526) Pulse Rate:  [74-78] 76 (01/08 0526) Resp:  [15-16] 15 (01/08 0526) BP: (109-135)/(74-82) 109/74 (01/08 0526) SpO2:  [96 %-99 %] 99 % (01/08 0526)  Intake/Output from previous day: 01/07 0701 - 01/08 0700 In: 1912.8 [P.O.:720; I.V.:1192.8] Out: -  Intake/Output this shift: No intake/output data recorded. Nutritional status:  Diet Order            Diet regular Room service appropriate? Yes; Fluid consistency: Thin  Diet effective now               Lab Results: Results for orders placed or performed during the hospital encounter of 03/10/18 (from the past 48 hour(s))  Comprehensive metabolic panel     Status: Abnormal   Collection Time: 03/11/18  8:24 PM  Result Value Ref Range   Sodium 139 135 - 145 mmol/L   Potassium 4.1 3.5 - 5.1 mmol/L   Chloride 113 (H) 98 - 111 mmol/L   CO2 23 22 - 32 mmol/L   Glucose, Bld 77 70 - 99 mg/dL   BUN 22 (H) 6 - 20 mg/dL   Creatinine, Ser 1.03 (H) 0.44 - 1.00 mg/dL   Calcium 8.9 8.9 - 10.3 mg/dL   Total Protein 5.7 (L) 6.5 - 8.1 g/dL   Albumin 3.2 (L) 3.5 - 5.0 g/dL   AST 16 15 - 41 U/L   ALT 14 0 -  44 U/L   Alkaline Phosphatase 80 38 - 126 U/L   Total Bilirubin 0.3 0.3 - 1.2 mg/dL   GFR calc non Af Amer >60 >60 mL/min   GFR calc Af Amer >60 >60 mL/min   Anion gap 3 (L) 5 - 15    Comment: Performed at [facility], [address] [place], [place] [address]  Magnesium     Status: None   Collection Time: 03/12/18  6:07 AM  Result Value Ref Range   Magnesium 2.0 1.7 - 2.4 mg/dL    Comment: Performed at [facility], [address], [place], [place] [address]  Phosphorus     Status: None   Collection Time: 03/12/18  6:07 AM  Result Value Ref Range   Phosphorus 3.9 2.5 - 4.6 mg/dL    Comment: Performed at [facility], [address], [place], [place] [address]  Cryptococcal antigen, CSF      Status: None   Collection Time: 03/12/18  2:15 PM  Result Value Ref Range   Crypto Ag NEGATIVE NEGATIVE   Cryptococcal Ag Titer NOT INDICATED NOT INDICATED    Comment: Performed at [facility] [address], [place], [place] [address]  Protein and glucose, CSF     Status: None   Collection Time: 03/12/18  2:15 PM  Result Value Ref Range   Glucose, CSF 55 40 - 70 mg/dL   Total  Protein, CSF 22 15 - 45 mg/dL    Comment: Performed at [facility], [address], [place], [place] [address]  CSF culture     Status: None (Preliminary result)   Collection Time: 03/12/18  2:22 PM  Result Value Ref Range   Specimen Description CSF    Special Requests NONE    Gram Stain      CYTOSPIN SMEAR WBC PRESENT, PREDOMINANTLY MONONUCLEAR NO ORGANISMS SEEN Performed at [facility] Performed at [facility], [address] [place], [place] [address]    Culture PENDING    Report Status PENDING   CSF cell count with differential     Status: Abnormal   Collection Time: 03/12/18  2:25 PM  Result Value Ref Range   Tube # 4    Color, CSF COLORLESS COLORLESS   Appearance, CSF CLEAR CLEAR   Supernatant NOT INDICATED    RBC Count, CSF 2 (H) 0 /cu mm   WBC, CSF 0 0 - 5 /cu mm    Comment: Performed at [facility], [address], [place], [place] [address]    Lipid Panel Recent Labs    03/11/18 0624  CHOL 175  TRIG 155*  HDL 48  CHOLHDL 3.6  VLDL 31  LDLCALC 96    Studies/Results:    The brain MRI is reviewed in person again and shows what appears to be enlarged pituitary but with no clear abnormal enhancement of the sella or brain itself.  Medications:  Scheduled Meds: . aspirin  300 mg Rectal Daily   Or  . aspirin  325 mg Oral Daily  . buPROPion  150 mg Oral BID  . celecoxib  200 mg Oral Q1500  . clonazePAM  0.5 mg Oral BID  . enoxaparin (LOVENOX) injection  40 mg Subcutaneous Q24H  . metoprolol succinate  12.5 mg Oral q morning - 10a  . nicotine  21 mg  Transdermal Daily  . pantoprazole  40 mg Oral q morning - 10a  . polycarbophil  625 mg Oral BID  . simvastatin  20 mg  Oral q morning - 10a  . venlafaxine XR  150 mg Oral Q breakfast   Continuous Infusions: . sodium chloride 75 mL/hr at 03/13/18 0319   PRN Meds:.acetaminophen **OR** acetaminophen (TYLENOL) oral liquid 160 mg/5 mL **OR** acetaminophen, clonazePAM, LORazepam, senna-docusate     LOS: 0 days   Fayetta Sorenson A. Merlene Laughter, M.D.  Diplomate, Tax adviser of Psychiatry and Neurology ( Neurology).

## 2018-03-13 NOTE — Plan of Care (Signed)
  Problem: Education: Goal: Knowledge of General Education information will improve Description Including pain rating scale, medication(s)/side effects and non-pharmacologic comfort measures 03/13/2018 1049 by Rance Muir, RN Outcome: Progressing 03/13/2018 1047 by Rance Muir, RN Outcome: Progressing   Problem: Health Behavior/Discharge Planning: Goal: Ability to manage health-related needs will improve 03/13/2018 1049 by Rance Muir, RN Outcome: Progressing 03/13/2018 1047 by Rance Muir, RN Outcome: Progressing   Problem: Clinical Measurements: Goal: Ability to maintain clinical measurements within normal limits will improve 03/13/2018 1049 by Rance Muir, RN Outcome: Progressing 03/13/2018 1047 by Rance Muir, RN Outcome: Progressing Goal: Will remain free from infection 03/13/2018 1049 by Rance Muir, RN Outcome: Progressing 03/13/2018 1047 by Rance Muir, RN Outcome: Progressing Goal: Diagnostic test results will improve 03/13/2018 1049 by Rance Muir, RN Outcome: Progressing 03/13/2018 1047 by Rance Muir, RN Outcome: Progressing Goal: Respiratory complications will improve 03/13/2018 1049 by Rance Muir, RN Outcome: Progressing 03/13/2018 1047 by Rance Muir, RN Outcome: Progressing Goal: Cardiovascular complication will be avoided 03/13/2018 1049 by Rance Muir, RN Outcome: Progressing 03/13/2018 1047 by Rance Muir, RN Outcome: Progressing   Problem: Activity: Goal: Risk for activity intolerance will decrease 03/13/2018 1049 by Rance Muir, RN Outcome: Progressing 03/13/2018 1047 by Rance Muir, RN Outcome: Progressing   Problem: Nutrition: Goal: Adequate nutrition will be maintained 03/13/2018 1049 by Rance Muir, RN Outcome: Progressing 03/13/2018 1047 by Rance Muir, RN Outcome: Progressing   Problem: Coping: Goal: Level of anxiety will decrease 03/13/2018 1049 by Rance Muir, RN Outcome: Progressing 03/13/2018 1047 by Rance Muir, RN Outcome: Progressing   Problem: Elimination: Goal: Will not experience complications  related to bowel motility 03/13/2018 1049 by Rance Muir, RN Outcome: Progressing 03/13/2018 1047 by Rance Muir, RN Outcome: Progressing Goal: Will not experience complications related to urinary retention 03/13/2018 1049 by Rance Muir, RN Outcome: Progressing 03/13/2018 1047 by Rance Muir, RN Outcome: Progressing   Problem: Pain Managment: Goal: General experience of comfort will improve 03/13/2018 1049 by Rance Muir, RN Outcome: Progressing 03/13/2018 1047 by Rance Muir, RN Outcome: Progressing   Problem: Safety: Goal: Ability to remain free from injury will improve 03/13/2018 1049 by Rance Muir, RN Outcome: Progressing 03/13/2018 1047 by Rance Muir, RN Outcome: Progressing   Problem: Skin Integrity: Goal: Risk for impaired skin integrity will decrease 03/13/2018 1049 by Rance Muir, RN Outcome: Progressing 03/13/2018 1047 by Rance Muir, RN Outcome: Progressing   Problem: Education: Goal: Knowledge of secondary prevention will improve 03/13/2018 1049 by Rance Muir, RN Outcome: Progressing 03/13/2018 1047 by Rance Muir, RN Outcome: Progressing Goal: Knowledge of patient specific risk factors addressed and post discharge goals established will improve 03/13/2018 1049 by Rance Muir, RN Outcome: Progressing 03/13/2018 1047 by Rance Muir, RN Outcome: Progressing

## 2018-03-15 LAB — CULTURE, BLOOD (ROUTINE X 2)
Culture: NO GROWTH
Culture: NO GROWTH
Special Requests: ADEQUATE
Special Requests: ADEQUATE

## 2018-03-15 LAB — CSF CULTURE

## 2018-03-15 LAB — CSF CULTURE W GRAM STAIN: Culture: NO GROWTH

## 2018-03-19 ENCOUNTER — Ambulatory Visit (HOSPITAL_COMMUNITY): Admit: 2018-03-19 | Payer: Medicare Other

## 2018-03-19 ENCOUNTER — Ambulatory Visit (HOSPITAL_COMMUNITY)
Admission: RE | Admit: 2018-03-19 | Discharge: 2018-03-19 | Disposition: A | Discharge status: Home / Self Care | Payer: Medicare Other | Source: Ambulatory Visit | Attending: Interventional Radiology | Admitting: Interventional Radiology

## 2018-03-19 ENCOUNTER — Other Ambulatory Visit (HOSPITAL_COMMUNITY): Payer: Self-pay | Admitting: Interventional Radiology

## 2018-03-19 DIAGNOSIS — I729 Aneurysm of unspecified site: Secondary | ICD-10-CM

## 2018-03-19 NOTE — Consult Note (Signed)
Chief Complaint: Patient was seen in consultation today for consideration of treatment for cerebral aneurysm at the request of Dr Norton Blizzard    Supervising Physician: Luanne Bras  Patient Status: [facility] - Out-pt  History of Present Illness: Rebecca Cox is a 58 y.o. female   Dr Roderic Palau note 03/13/18:  History of hemangioendothelioma of the liver followed at [facility], previous gastric sleeve surgery, was admitted to the hospital with dizziness, numbness on the left side of her face and transient diplopia.  Symptoms lasted approximately 10 minutes.  She was admitted to the hospital for further evaluation.  MRI imaging of the brain did not show any acute infarct.  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    Intracranial MRA: 1. 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 2. No emergent finding  Referred to Dr Estanislado Pandy for evaluation and consideration of treatment  Smokes 2 cigs daily Denies high Blood pressure +family Hx intracranial aneurysm-- Mother (unruptured)   Past Medical History:  Diagnosis Date  . Agoraphobia   . Cancer [facility])    liver    Past Surgical History:  Procedure Laterality Date  . BARIATRIC SURGERY    . CARPAL TUNNEL RELEASE    . TONSILLECTOMY      Allergies: Patient has no known allergies.  Medications: Prior to Admission medications   Medication Sig Start Date End Date Taking? Authorizing Provider  B-COMPLEX-C PO Take 1 tablet by mouth daily.    [provider]  buPROPion (WELLBUTRIN SR) 150 MG 12 hr tablet Take 150 mg by mouth 2 (two) times daily.    [provider]  celecoxib (CELEBREX) 200 MG capsule Take 200 mg by mouth daily. 5pm    [provider]  clonazePAM (KLONOPIN) 0.5 MG tablet Take 0.5 mg by mouth 2 (two) times daily. 1 tablet in the morning and take 1 tablet at 5p. May take additional half-whole tablet throughout the day as needed for anxiety    [provider]  metoprolol succinate (TOPROL-XL) 25 MG 24 hr tablet Take 12.5 mg by mouth every morning.    [provider]  pantoprazole (PROTONIX) 40 MG tablet Take 40 mg by mouth every morning.    [provider]  polycarbophil (FIBERCON) 625 MG tablet Take 625 mg by mouth 2 (two) times daily.    [provider]  simvastatin (ZOCOR) 20 MG tablet Take 20 mg by mouth every morning.    [provider]  venlafaxine XR (EFFEXOR-XR) 150 MG 24 hr capsule Take 150 mg by mouth 2 (two) times daily.    [provider]     No family history on file.  Social History   Socioeconomic History  . Marital status: Married    Spouse name: Not on file  . Number of children: Not on file  . Years of education: Not on file  . Highest education level: Not on file  Occupational History  . Not on file  Social Needs  . Financial resource strain: Not on file  . Food insecurity:    Worry: Not on file    Inability: Not on file  . Transportation needs:    Medical: Not on file    Non-medical: Not on file  Tobacco Use  . Smoking status: Current Every Day Smoker  . Smokeless tobacco: Never Used  Substance and Sexual Activity  . Alcohol use: Never    Frequency: Never  . Drug use: Never  . Sexual activity:  Not on file  Lifestyle  . Physical activity:    Days per week: Not on file    Minutes per session: Not on file  . Stress: Not on file  Relationships  . Social connections:    Talks on phone: Not on file    Gets together: Not on file    Attends religious service: Not on file    Active member of club or organization: Not on file    Attends meetings of clubs or organizations: Not on file    Relationship status: Not on file  Other Topics Concern  . Not on file  Social History Narrative  . Not on file    Review of Systems: A 12 point ROS discussed and pertinent positives are indicated in the HPI above.  All other systems are negative.  Review of  Systems  Constitutional: Negative for activity change, appetite change, fatigue and fever.  HENT: Negative for tinnitus and trouble swallowing.   Eyes: Negative for visual disturbance.  Respiratory: Negative for cough and shortness of breath.   Cardiovascular: Negative for chest pain.  Gastrointestinal: Negative for abdominal pain.  Musculoskeletal: Negative for back pain and gait problem.  Neurological: Positive for headaches. Negative for dizziness, tremors, seizures, syncope, facial asymmetry, speech difficulty, weakness, light-headedness and numbness.  Psychiatric/Behavioral: Negative for behavioral problems, confusion and decreased concentration.    Vital Signs: There were no vitals taken for this visit.  Physical Exam Vitals signs reviewed.  Constitutional:      Appearance: Normal appearance.  HENT:     Head: Atraumatic.  Eyes:     Extraocular Movements: Extraocular movements intact.  Neck:     Musculoskeletal: Normal range of motion.  Musculoskeletal: Normal range of motion.  Skin:    General: Skin is warm and dry.  Neurological:     General: No focal deficit present.     Mental Status: She is alert and oriented to person, place, and time.  Psychiatric:        Mood and Affect: Mood normal.        Behavior: Behavior normal.        Thought Content: Thought content normal.        Judgment: Judgment normal.     Imaging: Ct Head Wo Contrast  Result Date: 03/10/2018 CLINICAL DATA:  Acute onset dizziness and difficulty walking today. EXAM: CT HEAD WITHOUT CONTRAST TECHNIQUE: Contiguous axial images were obtained from the base of the skull through the vertex without intravenous contrast. COMPARISON:  None. FINDINGS: Brain: No evidence of acute infarction, hemorrhage, hydrocephalus, extra-axial collection or mass lesion/mass effect. Vascular: No hyperdense vessel or unexpected calcification. Skull: Normal. Negative for fracture or focal lesion. Sinuses/Orbits: Negative. Other:  None. IMPRESSION: Negative head CT. Electronically Signed   By: Inge Rise M.D.   On: 03/10/2018 12:09   Mr Brain Wo Contrast  Result Date: 03/11/2018 CLINICAL DATA:  Headache, new, malignancy suspected. Dizziness and weakness for 5 days EXAM: MRI HEAD WITHOUT CONTRAST MRA HEAD WITHOUT CONTRAST TECHNIQUE: Multiplanar, multiecho pulse sequences of the brain and surrounding structures were obtained without intravenous contrast. Angiographic images of the head were obtained using MRA technique without contrast. COMPARISON:  None. FINDINGS: MRI HEAD FINDINGS Brain: No acute infarction, hemorrhage, hydrocephalus, extra-axial collection or mass lesion. Mild patchy FLAIR hyperintensity in the pons. Even milder periventricular FLAIR hyperintensity. Normal brain volume. Partially empty sella considered incidental in isolation. Vascular: Arterial findings below. Normal dural venous sinus flow voids. 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 There is a lobulated superiorly directed anterior communicating artery aneurysm measuring 5 mm in width and 3 mm base to dome. No major branch occlusion or flow limiting stenosis. Negative for vessel beading. IMPRESSION: Brain MRI: 1. No acute finding or specific explanation for headache. 2. Mild signal abnormality in the pons and periventricular white matter that is usually from chronic small vessel ischemia. Intracranial MRA: 1. 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 2. No emergent finding. Electronically Signed   By: Monte Fantasia M.D.   On: 03/11/2018 09:08   Mr Brain W Contrast  Result Date: 03/12/2018 CLINICAL DATA:  Liver cancer. Headache. Rule out metastatic disease. EXAM: MRI HEAD WITH CONTRAST TECHNIQUE: Multiplanar, multiecho pulse sequences of the brain and surrounding structures were obtained with intravenous contrast. CONTRAST:   10 mL Gadovist IV COMPARISON:  Unenhanced MRI head 03/11/2018 FINDINGS: Normal enhancement. No enhancing mass lesion. Leptomeningeal enhancement normal. No acute abnormality. See recent unenhanced MRI report. IMPRESSION: No acute abnormality and negative for intracranial metastatic disease. Electronically Signed   By: Franchot Gallo M.D.   On: 03/12/2018 11:16   US Carotid Bilateral (at [facility] Only)  Result Date: 03/11/2018 CLINICAL DATA:  58 year old female with history of vertigo EXAM: BILATERAL CAROTID DUPLEX ULTRASOUND TECHNIQUE: Pearline Cables scale imaging, color Doppler and duplex ultrasound were performed of bilateral carotid and vertebral arteries in the neck. COMPARISON:  None. FINDINGS: Criteria: Quantification of carotid stenosis is based on velocity parameters that correlate the residual internal carotid diameter with NASCET-based stenosis levels, using the diameter of the distal internal carotid lumen as the denominator for stenosis measurement. The following velocity measurements were obtained: RIGHT ICA:  Systolic 91 cm/sec, Diastolic 34 cm/sec CCA:  157 cm/sec SYSTOLIC ICA/CCA RATIO:  0.9 ECA:  96 cm/sec LEFT ICA:  Systolic 78 cm/sec, Diastolic 38 cm/sec CCA:  93 cm/sec SYSTOLIC ICA/CCA RATIO:  0.8 ECA:  77 cm/sec Right Brachial SBP: Not acquired Left Brachial SBP: Not acquired RIGHT CAROTID ARTERY: No significant calcified disease of the right common carotid artery. Intermediate waveform maintained. Heterogeneous plaque without significant calcifications at the right carotid bifurcation. Low resistance waveform of the right ICA. No significant tortuosity. RIGHT VERTEBRAL ARTERY: Antegrade flow with low resistance waveform. LEFT CAROTID ARTERY: No significant calcified disease of the left common carotid artery. Intermediate waveform maintained. Heterogeneous plaque at the left carotid bifurcation without significant calcifications. Low resistance waveform of the left ICA. LEFT VERTEBRAL ARTERY:   Antegrade flow with low resistance waveform. Additional: Right thyroid nodule measures 1.8 cm. IMPRESSION: Color duplex indicates minimal heterogeneous plaque, with no hemodynamically significant stenosis by duplex criteria in the extracranial cerebrovascular circulation. Incidental finding of right thyroid nodule measuring 1.8 cm. Dedicated thyroid ultrasound recommended if further characterisation warranted. Signed, Dulcy Fanny. Dellia Nims, RPVI [facility] Electronically Signed   By: Corrie Mckusick D.O.   On: 03/11/2018 09:41   Dg Fluoro Guided Needle Plc Aspiration/injection Loc  Result Date: 03/12/2018 CLINICAL DATA:  Headaches, dizziness, difficulty controlling legs EXAM: DIAGNOSTIC LUMBAR PUNCTURE UNDER FLUOROSCOPIC GUIDANCE FLUOROSCOPY TIME:  Fluoroscopy Time:  0 minutes 12 seconds Radiation Exposure Index (if provided by the fluoroscopic device): 3.4 mGy Number of Acquired Spot Images: 2 PROCEDURE: Procedure, benefits, and risks were discussed with the patient, including alternatives. Patient's questions were answered. Written informed consent was obtained. Timeout protocol followed. Patient placed prone. L4-L5 disc  space was localized under fluoroscopy. Skin prepped and draped in usual sterile fashion. Skin and soft tissues anesthetized with 3 mL of 1% lidocaine. 22 gauge needle was advanced into the spinal canal where clear colorless CSF was encountered. 10 mL of CSF was obtained in 4 tubes for requested analysis. Procedure tolerated very well by patient without immediate complication. IMPRESSION: Fluoroscopic guided lumbar puncture as above. Electronically Signed   By: Lavonia Dana M.D.   On: 03/12/2018 15:08   Mr Jodene Nam Head/brain ZO Cm  Result Date: 03/11/2018 CLINICAL DATA:  Headache, new, malignancy suspected. Dizziness and weakness for 5 days EXAM: MRI HEAD WITHOUT CONTRAST MRA HEAD WITHOUT CONTRAST TECHNIQUE: Multiplanar, multiecho pulse  sequences of the brain and surrounding structures were obtained without intravenous contrast. Angiographic images of the head were obtained using MRA technique without contrast. COMPARISON:  None. FINDINGS: MRI HEAD FINDINGS Brain: No acute infarction, hemorrhage, hydrocephalus, extra-axial collection or mass lesion. Mild patchy FLAIR hyperintensity in the pons. Even milder periventricular FLAIR hyperintensity. Normal brain volume. Partially empty sella considered incidental in isolation. Vascular: Arterial findings below. Normal dural venous sinus flow voids. 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 No major branch occlusion or flow limiting stenosis. Negative for vessel beading. IMPRESSION: Brain MRI: 1. No acute finding or specific explanation for headache. 2. Mild signal abnormality in the pons and periventricular white matter that is usually from chronic small vessel ischemia. Intracranial MRA: 1. 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 2. No emergent finding. Electronically Signed   By: Monte Fantasia M.D.   On: 03/11/2018 09:08    Labs:  CBC: Recent Labs    03/10/18 1058  WBC 6.5  HGB 13.2  HCT 43.3  PLT 217    COAGS: Recent Labs    03/10/18 1058  INR 0.97  APTT 25    BMP: Recent Labs    03/10/18 1058 03/11/18 2024  NA 139 139  K 3.9 4.1  CL 110 113*  CO2 25 23  GLUCOSE 98 77  BUN 20 22*  CALCIUM 9.4 8.9  CREATININE 0.97 1.03*  GFRNONAA >60 >60  GFRAA >60 >60    LIVER FUNCTION TESTS: Recent Labs    03/10/18 1058 03/11/18 2024  BILITOT 0.5 0.3  AST 18 16  ALT 17 14  ALKPHOS 101 80  PROT 6.9 5.7*  ALBUMIN 3.8 3.2*    TUMOR MARKERS: No results for input(s): AFPTM, CEA, CA199, CHROMGRNA in the last 8760  hours.  Assessment and Plan:  Dr Estanislado Pandy reviewed recent imaging and answered all questions for pt and husband Pointed out incidental Anterior communicating artery aneurysm. Pt is interested in moving forward with cerebral arteriogram with possible coiling/stent placement Pt will hear from scheduler for time and date Will need to start Plavix (in addition to ASA 325 that she already takes) 5-7 days before procedure --Rx given. She has good understanding of this plan   Thank you for this interesting consult.  I greatly enjoyed meeting Rebecca Cox and look forward to participating in their care.  A copy of this report was sent to the requesting provider on this date.  Electronically Signed: Lavonia Drafts, PA-C 03/19/2018, 12:38 PM   I spent a total of  30 Minutes   in  face to face in clinical consultation, greater than 50% of which was counseling/coordinating care for [facility] aneurysm treatment

## 2018-03-20 ENCOUNTER — Other Ambulatory Visit (HOSPITAL_COMMUNITY): Payer: Self-pay | Admitting: Interventional Radiology

## 2018-03-20 DIAGNOSIS — I671 Cerebral aneurysm, nonruptured: Secondary | ICD-10-CM

## 2018-03-29 ENCOUNTER — Encounter (HOSPITAL_COMMUNITY): Payer: Self-pay | Admitting: *Deleted

## 2018-03-29 ENCOUNTER — Other Ambulatory Visit: Payer: Self-pay | Admitting: Radiology

## 2018-03-29 ENCOUNTER — Other Ambulatory Visit: Payer: Self-pay

## 2018-03-29 NOTE — Progress Notes (Signed)
Anesthesia Chart Review: SAME DAY WORKUP   Case:  [id number] Date/Time:  03/11/2018 0715   Procedure:  EMBOLIZATION (N/A )   Anesthesia type:  General   Pre-op diagnosis:  brain aneurysm   Location:  [place] / [place] OR   Surgeon:  Luanne Bras, MD     DISCUSSION:  58 yo female current smoker. Pertinent hx includes CKD, GERD, OSA not on CPAP, ACA aneurysm, Hepatic hemangioendothelioma (follows with oncology at [facility]), s/p gastric sleeve 05/11/17.  Recent hospitalization 03/13/2018 for numbness on the left side of her face and transient diplopia. She underwent extensive workup including EEG, MRI of the brain, and lumbar puncture. Other than incidental finding of ACA aneurysm the tests were unrevealing. 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 Arrangements were also made to f/u with interventional radiology for management of cerebral aneurysm.   Pt previously followed with cardiologist at [facility], Dr. Rogene Houston, for DOE. Her symptoms improved with weight loss s/p gastric sleeve 05/11/17. Per last OV 02/20/2018 she was advised to f/u PRN. Per note: "Reather Converse had a lexiscan stress test on 03/07/17 showing no definite evidence of ischemia or infarction. Soft tissue attenuation artifact present. 2D echo shows normal LV function with trace MR. She reports dyspnea with mild exertion, but overall stable. Her BP is normal, and fluid status is stable. She may reduce toprol XL to 12.5 mg/day Her lipids are being monitored by her PCP."  Anticipate she can proceed as planned barring acute status change.  PROVIDERS: Rogene Houston, MD is Cardiologist  Ulysees Barns, MD is Oncologist  LABS: Will need DOS labs.  IMAGES: MRA Brain 03/11/2018: IMPRESSION: Brain MRI:  1. No acute finding or specific explanation for headache. 2. Mild signal abnormality in the pons and periventricular  white matter that is usually from chronic small vessel ischemia.  Intracranial MRA:  1. 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 2. No emergent finding.  CT Chest 01/23/2018 (care everywhere): Impression:   1. No definite evidence of metastatic disease.  2. Stable indeterminant pulmonary nodules compared to the most recent prior. A 5 mm solid nodule in the right lower lobe has increased in size compared to 08/2016. No new nodules. Attention on followup in 6-12 months.  EKG: 03/10/2018: Sinus rhythm. Rate 88. Low voltage, precordial leads. Borderline T wave abnormalities. Baseline wander in lead(s) II III aVR aVF V3 V6  CV: TTE 03/11/2018: Study Conclusions  - Left ventricle: The cavity size was normal. Wall thickness was   increased in a pattern of mild LVH. Systolic function was normal.   The estimated ejection fraction was in the range of 60% to 65%.   Wall motion was normal; there were no regional wall motion   abnormalities. Findings consistent with left ventricular   diastolic dysfunction, grade indeterminate. - Atrial septum: No defect or patent foramen ovale was identified.  Carotid US 03/11/2018: IMPRESSION: Color duplex indicates minimal heterogeneous plaque, with no hemodynamically significant stenosis by duplex criteria in the extracranial cerebrovascular circulation.  Incidental finding of right thyroid nodule measuring 1.8 cm. Dedicated thyroid ultrasound recommended if further characterisation Warranted.  Nuclear stress 03/09/2017 (care everywhere):  PERFUSION: No definite evidence of ischemia or infarction.   FUNCTIONAL RESULTS (calculated via Gated SPECT)  Stress Image LV EF: 65 %       EDV: 48 ml EDVI: 19 ml/m TID: 1.36  ESV: 17 ml ESVI: 7 ml/m   LV EF & Wall Motion: Normal LV size and systolic function, without definite regional WMA.   Past Medical History:  Diagnosis Date  . Agoraphobia   . Arthritis   . Cancer ([place])     liver  . Cerebral aneurysm   . Chronic kidney disease    stage 3  . Coronary artery disease   . Depression   . Diverticulitis   . Esophageal hernia   . Family history of adverse reaction to anesthesia    Pt suspects that " mother had cardiac arrest from anesthesia- not exactly sure"   . GERD (gastroesophageal reflux disease)   . Headache   . Sleep apnea    does not wear CPAP  . Thyroid nodule   . Wears glasses   . Wears partial dentures     Past Surgical History:  Procedure Laterality Date  . BARIATRIC SURGERY    . CARPAL TUNNEL RELEASE    . COLONOSCOPY W/ BIOPSIES AND POLYPECTOMY    . MULTIPLE TOOTH EXTRACTIONS    . TONSILLECTOMY      MEDICATIONS: No current facility-administered medications for this encounter.    Marland Kitchen aspirin 325 MG EC tablet  . clopidogrel (PLAVIX) 75 MG tablet  . topiramate (TOPAMAX) 25 MG tablet  . B-COMPLEX-C PO  . buPROPion (WELLBUTRIN SR) 150 MG 12 hr tablet  . celecoxib (CELEBREX) 200 MG capsule  . clonazePAM (KLONOPIN) 0.5 MG tablet  . metoprolol succinate (TOPROL-XL) 25 MG 24 hr tablet  . pantoprazole (PROTONIX) 40 MG tablet  . polycarbophil (FIBERCON) 625 MG tablet  . simvastatin (ZOCOR) 20 MG tablet  . venlafaxine XR (EFFEXOR-XR) 150 MG 24 hr capsule    Wynonia Musty [facility] Short Stay Center/Anesthesiology Phone [phone] 03/29/2018 1:15 PM

## 2018-03-29 NOTE — Progress Notes (Signed)
Pt denies sob and chest pain. Pt stated that she is under the care of Dr. De Nurse, Cardiology. Pt denies recent labs. Pt denies having a chest x ray within the last year. Pt made aware to stop taking vitamins, fish oil and herbal medications. Do not take any NSAIDs ie: Ibuprofen, Advil, Naproxen (Aleve), Motrin, Celebrex, BC and Goody Powder. Pt verbalized understanding of all pre-op instructions. PA, Anesthesiology, asked to review pt history.

## 2018-03-29 NOTE — Anesthesia Preprocedure Evaluation (Addendum)
Anesthesia Evaluation  Patient identified by MRN, date of birth, ID band Patient awake  General Assessment Comment:ACA aneurysm  Reviewed: Allergy & Precautions, NPO status , Patient's Chart, lab work & pertinent test results  Airway Mallampati: II  TM Distance: >3 FB Neck ROM: Full    Dental no notable dental hx. (+) Dental Advisory Given, Missing, Chipped, Poor Dentition, Partial Upper   Pulmonary sleep apnea , Current Smoker,    Pulmonary exam normal breath sounds clear to auscultation       Cardiovascular negative cardio ROS Normal cardiovascular exam Rhythm:Regular Rate:Normal     Neuro/Psych negative neurological ROS  negative psych ROS   GI/Hepatic Neg liver ROS, GERD  ,  Endo/Other  negative endocrine ROS  Renal/GU Renal InsufficiencyRenal disease  negative genitourinary   Musculoskeletal negative musculoskeletal ROS (+)   Abdominal   Peds negative pediatric ROS (+)  Hematology negative hematology ROS (+)   Anesthesia Other Findings   Reproductive/Obstetrics negative OB ROS                           Anesthesia Physical Anesthesia Plan  ASA: III  Anesthesia Plan: General   Post-op Pain Management:    Induction: Intravenous  PONV Risk Score and Plan: 2 and Ondansetron, Dexamethasone and Treatment may vary due to age or medical condition  Airway Management Planned: Oral ETT  Additional Equipment: Arterial line  Intra-op Plan:   Post-operative Plan: Possible Post-op intubation/ventilation  Informed Consent: I have reviewed the patients History and Physical, chart, labs and discussed the procedure including the risks, benefits and alternatives for the proposed anesthesia with the patient or authorized representative who has indicated his/her understanding and acceptance.     Dental advisory given  Plan Discussed with: CRNA and Surgeon  Anesthesia Plan Comments: (See  PAT note by Karoline Caldwell, PA-C )       Anesthesia Quick Evaluation

## 2018-04-01 ENCOUNTER — Ambulatory Visit (HOSPITAL_COMMUNITY): Payer: Medicare Other | Admitting: Certified Registered Nurse Anesthetist

## 2018-04-01 ENCOUNTER — Inpatient Hospital Stay (HOSPITAL_COMMUNITY): Payer: Medicare Other

## 2018-04-01 ENCOUNTER — Inpatient Hospital Stay (HOSPITAL_COMMUNITY)
Admission: AD | Admit: 2018-04-01 | Discharge: 2018-05-05 | DRG: 025 | Disposition: E | Payer: Medicare Other | Attending: Interventional Radiology | Admitting: Interventional Radiology

## 2018-04-01 ENCOUNTER — Encounter (HOSPITAL_COMMUNITY): Payer: Self-pay | Admitting: Anesthesiology

## 2018-04-01 ENCOUNTER — Encounter (HOSPITAL_COMMUNITY): Admission: AD | Disposition: E | Payer: Self-pay | Source: Home / Self Care | Attending: Interventional Radiology

## 2018-04-01 ENCOUNTER — Encounter (HOSPITAL_COMMUNITY): Payer: Self-pay | Admitting: *Deleted

## 2018-04-01 ENCOUNTER — Encounter (HOSPITAL_COMMUNITY): Payer: Self-pay

## 2018-04-01 ENCOUNTER — Ambulatory Visit (HOSPITAL_COMMUNITY)
Admission: RE | Admit: 2018-04-01 | Discharge: 2018-04-01 | Disposition: A | Discharge status: Home / Self Care | Payer: Medicare Other | Source: Ambulatory Visit | Attending: Interventional Radiology | Admitting: Interventional Radiology

## 2018-04-01 DIAGNOSIS — I63419 Cerebral infarction due to embolism of unspecified middle cerebral artery: Secondary | ICD-10-CM | POA: Diagnosis not present

## 2018-04-01 DIAGNOSIS — E876 Hypokalemia: Secondary | ICD-10-CM | POA: Diagnosis not present

## 2018-04-01 DIAGNOSIS — I97638 Postprocedural hematoma of a circulatory system organ or structure following other circulatory system procedure: Secondary | ICD-10-CM | POA: Diagnosis not present

## 2018-04-01 DIAGNOSIS — E1122 Type 2 diabetes mellitus with diabetic chronic kidney disease: Secondary | ICD-10-CM | POA: Diagnosis present

## 2018-04-01 DIAGNOSIS — R2990 Unspecified symptoms and signs involving the nervous system: Secondary | ICD-10-CM

## 2018-04-01 DIAGNOSIS — R451 Restlessness and agitation: Secondary | ICD-10-CM | POA: Diagnosis not present

## 2018-04-01 DIAGNOSIS — R29718 NIHSS score 18: Secondary | ICD-10-CM | POA: Diagnosis present

## 2018-04-01 DIAGNOSIS — Z86010 Personal history of colonic polyps: Secondary | ICD-10-CM

## 2018-04-01 DIAGNOSIS — N183 Chronic kidney disease, stage 3 (moderate): Secondary | ICD-10-CM | POA: Diagnosis present

## 2018-04-01 DIAGNOSIS — J9601 Acute respiratory failure with hypoxia: Secondary | ICD-10-CM | POA: Diagnosis present

## 2018-04-01 DIAGNOSIS — Z9289 Personal history of other medical treatment: Secondary | ICD-10-CM

## 2018-04-01 DIAGNOSIS — R51 Headache: Secondary | ICD-10-CM | POA: Diagnosis present

## 2018-04-01 DIAGNOSIS — I2510 Atherosclerotic heart disease of native coronary artery without angina pectoris: Secondary | ICD-10-CM | POA: Diagnosis present

## 2018-04-01 DIAGNOSIS — R0603 Acute respiratory distress: Secondary | ICD-10-CM

## 2018-04-01 DIAGNOSIS — Z66 Do not resuscitate: Secondary | ICD-10-CM | POA: Diagnosis present

## 2018-04-01 DIAGNOSIS — J159 Unspecified bacterial pneumonia: Secondary | ICD-10-CM | POA: Diagnosis not present

## 2018-04-01 DIAGNOSIS — J155 Pneumonia due to Escherichia coli: Secondary | ICD-10-CM | POA: Diagnosis present

## 2018-04-01 DIAGNOSIS — R9401 Abnormal electroencephalogram [EEG]: Secondary | ICD-10-CM | POA: Diagnosis present

## 2018-04-01 DIAGNOSIS — Z7982 Long term (current) use of aspirin: Secondary | ICD-10-CM

## 2018-04-01 DIAGNOSIS — R410 Disorientation, unspecified: Secondary | ICD-10-CM | POA: Diagnosis not present

## 2018-04-01 DIAGNOSIS — J156 Pneumonia due to other aerobic Gram-negative bacteria: Secondary | ICD-10-CM | POA: Diagnosis not present

## 2018-04-01 DIAGNOSIS — Z7902 Long term (current) use of antithrombotics/antiplatelets: Secondary | ICD-10-CM

## 2018-04-01 DIAGNOSIS — I6340 Cerebral infarction due to embolism of unspecified cerebral artery: Secondary | ICD-10-CM

## 2018-04-01 DIAGNOSIS — E8770 Fluid overload, unspecified: Secondary | ICD-10-CM | POA: Diagnosis not present

## 2018-04-01 DIAGNOSIS — R34 Anuria and oliguria: Secondary | ICD-10-CM | POA: Diagnosis present

## 2018-04-01 DIAGNOSIS — M2450 Contracture, unspecified joint: Secondary | ICD-10-CM | POA: Diagnosis present

## 2018-04-01 DIAGNOSIS — F329 Major depressive disorder, single episode, unspecified: Secondary | ICD-10-CM | POA: Diagnosis present

## 2018-04-01 DIAGNOSIS — Z4659 Encounter for fitting and adjustment of other gastrointestinal appliance and device: Secondary | ICD-10-CM

## 2018-04-01 DIAGNOSIS — Z79899 Other long term (current) drug therapy: Secondary | ICD-10-CM

## 2018-04-01 DIAGNOSIS — I671 Cerebral aneurysm, nonruptured: Secondary | ICD-10-CM | POA: Diagnosis present

## 2018-04-01 DIAGNOSIS — J154 Pneumonia due to other streptococci: Secondary | ICD-10-CM | POA: Diagnosis present

## 2018-04-01 DIAGNOSIS — Z6841 Body Mass Index (BMI) 40.0 and over, adult: Secondary | ICD-10-CM | POA: Diagnosis not present

## 2018-04-01 DIAGNOSIS — Z8505 Personal history of malignant neoplasm of liver: Secondary | ICD-10-CM

## 2018-04-01 DIAGNOSIS — K219 Gastro-esophageal reflux disease without esophagitis: Secondary | ICD-10-CM | POA: Diagnosis present

## 2018-04-01 DIAGNOSIS — M1990 Unspecified osteoarthritis, unspecified site: Secondary | ICD-10-CM | POA: Diagnosis present

## 2018-04-01 DIAGNOSIS — R579 Shock, unspecified: Secondary | ICD-10-CM

## 2018-04-01 DIAGNOSIS — R403 Persistent vegetative state: Secondary | ICD-10-CM | POA: Diagnosis present

## 2018-04-01 DIAGNOSIS — F17210 Nicotine dependence, cigarettes, uncomplicated: Secondary | ICD-10-CM | POA: Diagnosis present

## 2018-04-01 DIAGNOSIS — J9600 Acute respiratory failure, unspecified whether with hypoxia or hypercapnia: Secondary | ICD-10-CM | POA: Diagnosis not present

## 2018-04-01 DIAGNOSIS — G9340 Encephalopathy, unspecified: Secondary | ICD-10-CM | POA: Diagnosis not present

## 2018-04-01 DIAGNOSIS — N179 Acute kidney failure, unspecified: Secondary | ICD-10-CM | POA: Diagnosis not present

## 2018-04-01 DIAGNOSIS — I97630 Postprocedural hematoma of a circulatory system organ or structure following a cardiac catheterization: Secondary | ICD-10-CM

## 2018-04-01 DIAGNOSIS — E785 Hyperlipidemia, unspecified: Secondary | ICD-10-CM | POA: Diagnosis not present

## 2018-04-01 DIAGNOSIS — R509 Fever, unspecified: Secondary | ICD-10-CM | POA: Diagnosis not present

## 2018-04-01 DIAGNOSIS — M6281 Muscle weakness (generalized): Secondary | ICD-10-CM | POA: Diagnosis present

## 2018-04-01 DIAGNOSIS — I9589 Other hypotension: Secondary | ICD-10-CM | POA: Diagnosis not present

## 2018-04-01 DIAGNOSIS — R7881 Bacteremia: Secondary | ICD-10-CM | POA: Diagnosis not present

## 2018-04-01 DIAGNOSIS — D62 Acute posthemorrhagic anemia: Secondary | ICD-10-CM | POA: Diagnosis not present

## 2018-04-01 DIAGNOSIS — G4730 Sleep apnea, unspecified: Secondary | ICD-10-CM | POA: Diagnosis present

## 2018-04-01 DIAGNOSIS — J9690 Respiratory failure, unspecified, unspecified whether with hypoxia or hypercapnia: Secondary | ICD-10-CM

## 2018-04-01 DIAGNOSIS — Z9911 Dependence on respirator [ventilator] status: Secondary | ICD-10-CM | POA: Diagnosis not present

## 2018-04-01 DIAGNOSIS — Z515 Encounter for palliative care: Secondary | ICD-10-CM | POA: Diagnosis present

## 2018-04-01 DIAGNOSIS — S75091A Other specified injury of femoral artery, right leg, initial encounter: Secondary | ICD-10-CM

## 2018-04-01 DIAGNOSIS — S75001A Unspecified injury of femoral artery, right leg, initial encounter: Secondary | ICD-10-CM | POA: Diagnosis not present

## 2018-04-01 DIAGNOSIS — R571 Hypovolemic shock: Secondary | ICD-10-CM | POA: Diagnosis present

## 2018-04-01 DIAGNOSIS — I63423 Cerebral infarction due to embolism of bilateral anterior cerebral arteries: Secondary | ICD-10-CM | POA: Diagnosis not present

## 2018-04-01 DIAGNOSIS — G4733 Obstructive sleep apnea (adult) (pediatric): Secondary | ICD-10-CM | POA: Diagnosis present

## 2018-04-01 DIAGNOSIS — I639 Cerebral infarction, unspecified: Secondary | ICD-10-CM | POA: Diagnosis not present

## 2018-04-01 HISTORY — PX: RADIOLOGY WITH ANESTHESIA: SHX6223

## 2018-04-01 HISTORY — DX: Headache, unspecified: R51.9

## 2018-04-01 HISTORY — DX: Chronic kidney disease, unspecified: N18.9

## 2018-04-01 HISTORY — DX: Depression, unspecified: F32.A

## 2018-04-01 HISTORY — PX: THROMBECTOMY FEMORAL ARTERY: SHX6406

## 2018-04-01 HISTORY — DX: Presence of spectacles and contact lenses: Z97.3

## 2018-04-01 HISTORY — PX: IR ANGIO VERTEBRAL SEL VERTEBRAL BILAT MOD SED: IMG5369

## 2018-04-01 HISTORY — DX: Presence of dental prosthetic device (complete) (partial): Z97.2

## 2018-04-01 HISTORY — DX: Nontoxic single thyroid nodule: E04.1

## 2018-04-01 HISTORY — DX: Headache: R51

## 2018-04-01 HISTORY — DX: Cerebral aneurysm, nonruptured: I67.1

## 2018-04-01 HISTORY — DX: Sleep apnea, unspecified: G47.30

## 2018-04-01 HISTORY — PX: IR ANGIO INTRA EXTRACRAN SEL COM CAROTID INNOMINATE UNI R MOD SED: IMG5359

## 2018-04-01 HISTORY — DX: Gastro-esophageal reflux disease without esophagitis: K21.9

## 2018-04-01 HISTORY — PX: APPLICATION OF WOUND VAC: SHX5189

## 2018-04-01 HISTORY — DX: Unspecified osteoarthritis, unspecified site: M19.90

## 2018-04-01 HISTORY — PX: IR ANGIO INTRA EXTRACRAN SEL INTERNAL CAROTID UNI L MOD SED: IMG5361

## 2018-04-01 HISTORY — DX: Major depressive disorder, single episode, unspecified: F32.9

## 2018-04-01 HISTORY — PX: IR TRANSCATH/EMBOLIZ: IMG695

## 2018-04-01 HISTORY — DX: Family history of other specified conditions: Z84.89

## 2018-04-01 HISTORY — DX: Atherosclerotic heart disease of native coronary artery without angina pectoris: I25.10

## 2018-04-01 HISTORY — PX: HEMATOMA EVACUATION: SHX5118

## 2018-04-01 HISTORY — PX: IR NEURO EACH ADD'L AFTER BASIC UNI LEFT (MS): IMG5373

## 2018-04-01 HISTORY — DX: Diverticulitis of intestine, part unspecified, without perforation or abscess without bleeding: K57.92

## 2018-04-01 HISTORY — DX: Diaphragmatic hernia without obstruction or gangrene: K44.9

## 2018-04-01 LAB — CBC
HCT: 33.6 % — ABNORMAL LOW (ref 36.0–46.0)
HCT: 38.4 % (ref 36.0–46.0)
Hemoglobin: 11.2 g/dL — ABNORMAL LOW (ref 12.0–15.0)
Hemoglobin: 12.5 g/dL (ref 12.0–15.0)
MCH: 29.3 pg (ref 26.0–34.0)
MCH: 28.2 pg (ref 26.0–34.0)
MCHC: 33.3 g/dL (ref 30.0–36.0)
MCHC: 32.6 g/dL (ref 30.0–36.0)
MCV: 88 fL (ref 80.0–100.0)
MCV: 86.7 fL (ref 80.0–100.0)
Platelets: 126 10*3/uL — ABNORMAL LOW (ref 150–400)
Platelets: 159 10*3/uL (ref 150–400)
RBC: 3.82 MIL/uL — ABNORMAL LOW (ref 3.87–5.11)
RBC: 4.43 MIL/uL (ref 3.87–5.11)
RDW: 13.9 % (ref 11.5–15.5)
RDW: 13.9 % (ref 11.5–15.5)
WBC: 8.3 10*3/uL (ref 4.0–10.5)
WBC: 16.1 10*3/uL — ABNORMAL HIGH (ref 4.0–10.5)
nRBC: 0 % (ref 0.0–0.2)
nRBC: 0 % (ref 0.0–0.2)

## 2018-04-01 LAB — PROTIME-INR
INR: 1.03
INR: 1.24
PROTHROMBIN TIME: 15.5 s — AB (ref 11.4–15.2)
Prothrombin Time: 13.4 seconds (ref 11.4–15.2)

## 2018-04-01 LAB — CBC WITH DIFFERENTIAL/PLATELET
Abs Immature Granulocytes: 0.03 10*3/uL (ref 0.00–0.07)
Basophils Absolute: 0.1 10*3/uL (ref 0.0–0.1)
Basophils Relative: 1 %
EOS ABS: 0.2 10*3/uL (ref 0.0–0.5)
Eosinophils Relative: 2 %
HCT: 42.9 % (ref 36.0–46.0)
Hemoglobin: 12.8 g/dL (ref 12.0–15.0)
Immature Granulocytes: 0 %
Lymphocytes Relative: 25 %
Lymphs Abs: 1.8 10*3/uL (ref 0.7–4.0)
MCH: 27.4 pg (ref 26.0–34.0)
MCHC: 29.8 g/dL — ABNORMAL LOW (ref 30.0–36.0)
MCV: 91.7 fL (ref 80.0–100.0)
MONOS PCT: 4 %
Monocytes Absolute: 0.3 10*3/uL (ref 0.1–1.0)
Neutro Abs: 4.9 10*3/uL (ref 1.7–7.7)
Neutrophils Relative %: 68 %
Platelets: 212 10*3/uL (ref 150–400)
RBC: 4.68 MIL/uL (ref 3.87–5.11)
RDW: 13.3 % (ref 11.5–15.5)
WBC: 7.3 10*3/uL (ref 4.0–10.5)
nRBC: 0 % (ref 0.0–0.2)

## 2018-04-01 LAB — APTT: aPTT: 26 seconds (ref 24–36)

## 2018-04-01 LAB — POCT I-STAT 7, (LYTES, BLD GAS, ICA,H+H)
Acid-base deficit: 9 mmol/L — ABNORMAL HIGH (ref 0.0–2.0)
Bicarbonate: 16.5 mmol/L — ABNORMAL LOW (ref 20.0–28.0)
CALCIUM ION: 1.22 mmol/L (ref 1.15–1.40)
HEMATOCRIT: 18 % — AB (ref 36.0–46.0)
HEMOGLOBIN: 6.1 g/dL — AB (ref 12.0–15.0)
O2 Saturation: 99 %
Potassium: 3.8 mmol/L (ref 3.5–5.1)
SODIUM: 144 mmol/L (ref 135–145)
TCO2: 17 mmol/L — ABNORMAL LOW (ref 22–32)
pCO2 arterial: 34.2 mmHg (ref 32.0–48.0)
pH, Arterial: 7.291 — ABNORMAL LOW (ref 7.350–7.450)
pO2, Arterial: 181 mmHg — ABNORMAL HIGH (ref 83.0–108.0)

## 2018-04-01 LAB — BLOOD GAS, ARTERIAL
Acid-base deficit: 6.6 mmol/L — ABNORMAL HIGH (ref 0.0–2.0)
Bicarbonate: 18 mmol/L — ABNORMAL LOW (ref 20.0–28.0)
Drawn by: 358491
FIO2: 40
MECHVT: 500 mL
O2 Saturation: 98.8 %
PATIENT TEMPERATURE: 98.6
PEEP: 5 cmH2O
RATE: 146 resp/min
pCO2 arterial: 33.3 mmHg (ref 32.0–48.0)
pH, Arterial: 7.352 (ref 7.350–7.450)
pO2, Arterial: 164 mmHg — ABNORMAL HIGH (ref 83.0–108.0)

## 2018-04-01 LAB — POCT ACTIVATED CLOTTING TIME
Activated Clotting Time: 202 seconds
Activated Clotting Time: 219 seconds
Activated Clotting Time: 191 seconds

## 2018-04-01 LAB — BASIC METABOLIC PANEL
Anion gap: 9 (ref 5–15)
Anion gap: 8 (ref 5–15)
BUN: 16 mg/dL (ref 6–20)
BUN: 13 mg/dL (ref 6–20)
CO2: 21 mmol/L — ABNORMAL LOW (ref 22–32)
CO2: 17 mmol/L — ABNORMAL LOW (ref 22–32)
Calcium: 9.8 mg/dL (ref 8.9–10.3)
Calcium: 9.3 mg/dL (ref 8.9–10.3)
Chloride: 111 mmol/L (ref 98–111)
Chloride: 111 mmol/L (ref 98–111)
Creatinine, Ser: 1.3 mg/dL — ABNORMAL HIGH (ref 0.44–1.00)
Creatinine, Ser: 1.04 mg/dL — ABNORMAL HIGH (ref 0.44–1.00)
GFR calc Af Amer: 53 mL/min — ABNORMAL LOW (ref >60)
GFR calc Af Amer: >60 mL/min (ref >60)
GFR calc non Af Amer: >60 mL/min (ref >60)
GFR, EST NON AFRICAN AMERICAN: 46 mL/min — AB (ref >60)
Glucose, Bld: 83 mg/dL (ref 70–99)
Glucose, Bld: 165 mg/dL — ABNORMAL HIGH (ref 70–99)
Potassium: 4.1 mmol/L (ref 3.5–5.1)
Potassium: 4.4 mmol/L (ref 3.5–5.1)
Sodium: 141 mmol/L (ref 135–145)
Sodium: 136 mmol/L (ref 135–145)

## 2018-04-01 LAB — GLUCOSE, CAPILLARY
GLUCOSE-CAPILLARY: 175 mg/dL — AB (ref 70–99)
Glucose-Capillary: 144 mg/dL — ABNORMAL HIGH (ref 70–99)

## 2018-04-01 LAB — POCT I-STAT 4, (NA,K, GLUC, HGB,HCT)
Glucose, Bld: 125 mg/dL — ABNORMAL HIGH (ref 70–99)
HCT: 24 % — ABNORMAL LOW (ref 36.0–46.0)
Hemoglobin: 8.2 g/dL — ABNORMAL LOW (ref 12.0–15.0)
Potassium: 4.2 mmol/L (ref 3.5–5.1)
Sodium: 143 mmol/L (ref 135–145)

## 2018-04-01 LAB — PREPARE RBC (CROSSMATCH)

## 2018-04-01 LAB — ABO/RH: ABO/RH(D): A POS

## 2018-04-01 LAB — MRSA PCR SCREENING: MRSA by PCR: NEGATIVE

## 2018-04-01 LAB — PLATELET INHIBITION P2Y12: Platelet Function  P2Y12: 108 [PRU] — ABNORMAL LOW (ref 194–418)

## 2018-04-01 SURGERY — EVACUATION HEMATOMA
Anesthesia: General | Site: Groin | Laterality: Right

## 2018-04-01 SURGERY — IR WITH ANESTHESIA
Anesthesia: General

## 2018-04-01 MED ORDER — FENTANYL CITRATE (PF) 100 MCG/2ML IJ SOLN: 50.0000 ug | Freq: Once | INTRAMUSCULAR | Status: DC

## 2018-04-01 MED ORDER — LIDOCAINE HCL (PF) 1 % IJ SOLN
INTRAMUSCULAR | Status: AC
  Filled 2018-04-01: qty 30

## 2018-04-01 MED ORDER — ACETAMINOPHEN 160 MG/5ML PO SOLN
650.0000 mg | ORAL | Status: DC | PRN
  Administered 2018-04-04 – 2018-04-10 (×14): 650 mg
  Filled 2018-04-01 (×16): qty 20.3

## 2018-04-01 MED ORDER — HEPARIN SODIUM (PORCINE) 1000 UNIT/ML IJ SOLN
INTRAMUSCULAR | Status: DC | PRN
  Administered 2018-04-01: 1000 [IU] via INTRAVENOUS
  Administered 2018-04-01: 500 [IU] via INTRAVENOUS
  Administered 2018-04-01: 3000 [IU] via INTRAVENOUS

## 2018-04-01 MED ORDER — FENTANYL CITRATE (PF) 100 MCG/2ML IJ SOLN
INTRAMUSCULAR | Status: AC
  Filled 2018-04-01: qty 4

## 2018-04-01 MED ORDER — TICAGRELOR 90 MG PO TABS
90.0000 mg | ORAL_TABLET | Freq: Two times a day (BID) | ORAL | Status: DC
  Administered 2018-04-02 – 2018-04-10 (×18): 90 mg
  Filled 2018-04-01 (×18): qty 1

## 2018-04-01 MED ORDER — ASPIRIN 81 MG PO CHEW: 324.0000 mg | CHEWABLE_TABLET | Freq: Every day | ORAL | Status: DC

## 2018-04-01 MED ORDER — IOPAMIDOL (ISOVUE-300) INJECTION 61%
INTRAVENOUS | Status: AC
  Filled 2018-04-01: qty 100

## 2018-04-01 MED ORDER — SODIUM CHLORIDE 0.9 % IV SOLN
INTRAVENOUS | Status: AC
  Filled 2018-04-01: qty 1.2

## 2018-04-01 MED ORDER — ALBUMIN HUMAN 5 % IV SOLN
INTRAVENOUS | Status: DC | PRN
  Administered 2018-04-01: 12:00:00 via INTRAVENOUS

## 2018-04-01 MED ORDER — PHENYLEPHRINE HCL 10 MG/ML IJ SOLN
INTRAMUSCULAR | Status: DC | PRN
  Administered 2018-04-01 (×2): 80 ug via INTRAVENOUS

## 2018-04-01 MED ORDER — FENTANYL 2500MCG IN NS 250ML (10MCG/ML) PREMIX INFUSION
25.0000 ug/h | INTRAVENOUS | Status: DC
  Administered 2018-04-01: 50 ug/h via INTRAVENOUS
  Administered 2018-04-02: 225 ug/h via INTRAVENOUS
  Administered 2018-04-02: 200 ug/h via INTRAVENOUS
  Filled 2018-04-01 (×4): qty 250

## 2018-04-01 MED ORDER — PROPOFOL 10 MG/ML IV BOLUS
INTRAVENOUS | Status: DC | PRN
  Administered 2018-04-01: 120 mg via INTRAVENOUS
  Administered 2018-04-01: 80 mg via INTRAVENOUS

## 2018-04-01 MED ORDER — FENTANYL CITRATE (PF) 250 MCG/5ML IJ SOLN
INTRAMUSCULAR | Status: AC
  Filled 2018-04-01: qty 5

## 2018-04-01 MED ORDER — CALCIUM CHLORIDE 10 % IV SOLN
INTRAVENOUS | Status: DC | PRN
  Administered 2018-04-01 (×5): 200 mg via INTRAVENOUS

## 2018-04-01 MED ORDER — ACETAMINOPHEN 325 MG PO TABS: 650.0000 mg | ORAL_TABLET | ORAL | Status: DC | PRN

## 2018-04-01 MED ORDER — 0.9 % SODIUM CHLORIDE (POUR BTL) OPTIME
TOPICAL | Status: DC | PRN
  Administered 2018-04-01: 1000 mL

## 2018-04-01 MED ORDER — LACTATED RINGERS IV SOLN
INTRAVENOUS | Status: DC | PRN
  Administered 2018-04-01 (×2): via INTRAVENOUS

## 2018-04-01 MED ORDER — ORAL CARE MOUTH RINSE
15.0000 mL | OROMUCOSAL | Status: DC
  Administered 2018-04-01 – 2018-04-10 (×89): 15 mL via OROMUCOSAL

## 2018-04-01 MED ORDER — SUGAMMADEX SODIUM 200 MG/2ML IV SOLN
INTRAVENOUS | Status: DC | PRN
  Administered 2018-04-01: 200 mg via INTRAVENOUS

## 2018-04-01 MED ORDER — PROMETHAZINE HCL 25 MG/ML IJ SOLN: 6.2500 mg | INTRAMUSCULAR | Status: DC | PRN

## 2018-04-01 MED ORDER — PROTAMINE SULFATE 10 MG/ML IV SOLN
INTRAVENOUS | Status: DC | PRN
  Administered 2018-04-01: 5 mg via INTRAVENOUS
  Administered 2018-04-01: 7.5 mg via INTRAVENOUS

## 2018-04-01 MED ORDER — CEFAZOLIN SODIUM-DEXTROSE 2-3 GM-%(50ML) IV SOLR
INTRAVENOUS | Status: DC | PRN
  Administered 2018-04-01: 2 g via INTRAVENOUS

## 2018-04-01 MED ORDER — ROCURONIUM BROMIDE 100 MG/10ML IV SOLN
INTRAVENOUS | Status: DC | PRN
  Administered 2018-04-01: 50 mg via INTRAVENOUS
  Administered 2018-04-01: 10 mg via INTRAVENOUS
  Administered 2018-04-01: 50 mg via INTRAVENOUS

## 2018-04-01 MED ORDER — LIDOCAINE HCL 1 % IJ SOLN
INTRAMUSCULAR | Status: AC
  Filled 2018-04-01: qty 20

## 2018-04-01 MED ORDER — LIDOCAINE HCL 1 % IJ SOLN
INTRAMUSCULAR | Status: DC | PRN
  Administered 2018-04-01: 30 mL

## 2018-04-01 MED ORDER — MIDAZOLAM HCL 5 MG/5ML IJ SOLN
INTRAMUSCULAR | Status: DC | PRN
  Administered 2018-04-01: 1 mg via INTRAVENOUS
  Administered 2018-04-01 (×2): 0.5 mg via INTRAVENOUS

## 2018-04-01 MED ORDER — ASPIRIN EC 325 MG PO TBEC: 325.0000 mg | DELAYED_RELEASE_TABLET | ORAL | Status: DC

## 2018-04-01 MED ORDER — SODIUM CHLORIDE 0.9 % IV SOLN
INTRAVENOUS | Status: DC | PRN
  Administered 2018-04-01: 500 mL

## 2018-04-01 MED ORDER — NIMODIPINE 30 MG PO CAPS: 0.0000 mg | ORAL_CAPSULE | ORAL | Status: DC

## 2018-04-01 MED ORDER — EPHEDRINE SULFATE 50 MG/ML IJ SOLN
INTRAMUSCULAR | Status: DC | PRN
  Administered 2018-04-01: 5 mg via INTRAVENOUS

## 2018-04-01 MED ORDER — SODIUM CHLORIDE 0.9 % IV SOLN
INTRAVENOUS | Status: DC
  Administered 2018-04-01 – 2018-04-02 (×2): via INTRAVENOUS

## 2018-04-01 MED ORDER — ROCURONIUM BROMIDE 50 MG/5ML IV SOSY
PREFILLED_SYRINGE | INTRAVENOUS | Status: AC
  Filled 2018-04-01: qty 5

## 2018-04-01 MED ORDER — PROPOFOL 500 MG/50ML IV EMUL
INTRAVENOUS | Status: DC | PRN
  Administered 2018-04-01: 20 ug/kg/min via INTRAVENOUS

## 2018-04-01 MED ORDER — MIDAZOLAM HCL 2 MG/2ML IJ SOLN
INTRAMUSCULAR | Status: AC
  Filled 2018-04-01: qty 2

## 2018-04-01 MED ORDER — CLOPIDOGREL BISULFATE 75 MG PO TABS: 75.0000 mg | ORAL_TABLET | ORAL | Status: DC

## 2018-04-01 MED ORDER — CLOPIDOGREL BISULFATE 75 MG PO TABS: 75.0000 mg | ORAL_TABLET | Freq: Every day | ORAL | Status: DC

## 2018-04-01 MED ORDER — CHLORHEXIDINE GLUCONATE 0.12% ORAL RINSE (MEDLINE KIT)
15.0000 mL | Freq: Two times a day (BID) | OROMUCOSAL | Status: DC
  Administered 2018-04-01 – 2018-04-10 (×19): 15 mL via OROMUCOSAL

## 2018-04-01 MED ORDER — ONDANSETRON HCL 4 MG/2ML IJ SOLN
INTRAMUSCULAR | Status: DC | PRN
  Administered 2018-04-01 (×3): 4 mg via INTRAVENOUS

## 2018-04-01 MED ORDER — SODIUM CHLORIDE 0.9 % IV SOLN
INTRAVENOUS | Status: DC | PRN
  Administered 2018-04-01: 25 ug/min via INTRAVENOUS

## 2018-04-01 MED ORDER — DEXAMETHASONE SODIUM PHOSPHATE 10 MG/ML IJ SOLN
INTRAMUSCULAR | Status: DC | PRN
  Administered 2018-04-01: 10 mg via INTRAVENOUS

## 2018-04-01 MED ORDER — PHENYLEPHRINE HCL-NACL 10-0.9 MG/250ML-% IV SOLN
0.0000 ug/min | INTRAVENOUS | Status: DC
  Administered 2018-04-01: 25 ug/min via INTRAVENOUS
  Administered 2018-04-01: 40 ug/min via INTRAVENOUS
  Administered 2018-04-02: 110 ug/min via INTRAVENOUS
  Administered 2018-04-02: 100 ug/min via INTRAVENOUS
  Administered 2018-04-02: 110 ug/min via INTRAVENOUS
  Administered 2018-04-02: 100 ug/min via INTRAVENOUS
  Administered 2018-04-02: 110 ug/min via INTRAVENOUS
  Administered 2018-04-02: 100 ug/min via INTRAVENOUS
  Administered 2018-04-02: 110 ug/min via INTRAVENOUS
  Administered 2018-04-02: 80 ug/min via INTRAVENOUS
  Administered 2018-04-02: 110 ug/min via INTRAVENOUS
  Administered 2018-04-02: 100 ug/min via INTRAVENOUS
  Administered 2018-04-03: 130 ug/min via INTRAVENOUS
  Administered 2018-04-03: 100 ug/min via INTRAVENOUS
  Administered 2018-04-03: 50 ug/min via INTRAVENOUS
  Administered 2018-04-03: 110 ug/min via INTRAVENOUS
  Administered 2018-04-03: 150 ug/min via INTRAVENOUS
  Administered 2018-04-03: 110 ug/min via INTRAVENOUS
  Administered 2018-04-03: 75 ug/min via INTRAVENOUS
  Administered 2018-04-03: 110 ug/min via INTRAVENOUS
  Administered 2018-04-03: 50 ug/min via INTRAVENOUS
  Administered 2018-04-03: 120 ug/min via INTRAVENOUS
  Administered 2018-04-04: 40 ug/min via INTRAVENOUS
  Administered 2018-04-04: 50 ug/min via INTRAVENOUS
  Filled 2018-04-01 (×2): qty 250
  Filled 2018-04-01: qty 500
  Filled 2018-04-01 (×8): qty 250
  Filled 2018-04-01: qty 500
  Filled 2018-04-01 (×13): qty 250

## 2018-04-01 MED ORDER — IOHEXOL 300 MG/ML  SOLN: 156.0000 mL | Freq: Once | INTRAMUSCULAR | Status: DC | PRN

## 2018-04-01 MED ORDER — FENTANYL CITRATE (PF) 100 MCG/2ML IJ SOLN: 25.0000 ug | INTRAMUSCULAR | Status: DC | PRN

## 2018-04-01 MED ORDER — LIDOCAINE HCL (CARDIAC) PF 100 MG/5ML IV SOSY
PREFILLED_SYRINGE | INTRAVENOUS | Status: DC | PRN
  Administered 2018-04-01: 40 mg via INTRAVENOUS

## 2018-04-01 MED ORDER — ASPIRIN 325 MG PO TABS: 325.0000 mg | ORAL_TABLET | Freq: Every day | ORAL | Status: DC

## 2018-04-01 MED ORDER — NITROGLYCERIN 1 MG/10 ML FOR IR/CATH LAB
INTRA_ARTERIAL | Status: AC
  Filled 2018-04-01: qty 10

## 2018-04-01 MED ORDER — SODIUM CHLORIDE 0.9% IV SOLUTION: Freq: Once | INTRAVENOUS | Status: DC

## 2018-04-01 MED ORDER — HEPARIN (PORCINE) 25000 UT/250ML-% IV SOLN: 650.0000 [IU]/h | INTRAVENOUS | Status: DC

## 2018-04-01 MED ORDER — GLYCOPYRROLATE 0.2 MG/ML IJ SOLN
INTRAMUSCULAR | Status: DC | PRN
  Administered 2018-04-01: 0.2 mg via INTRAVENOUS

## 2018-04-01 MED ORDER — ACETAMINOPHEN 650 MG RE SUPP: 650.0000 mg | RECTAL | Status: DC | PRN

## 2018-04-01 MED ORDER — FENTANYL BOLUS VIA INFUSION
50.0000 ug | INTRAVENOUS | Status: DC | PRN
  Filled 2018-04-01: qty 50

## 2018-04-01 MED ORDER — PANTOPRAZOLE SODIUM 40 MG IV SOLR
40.0000 mg | INTRAVENOUS | Status: DC
  Administered 2018-04-01 – 2018-04-06 (×6): 40 mg via INTRAVENOUS
  Filled 2018-04-01 (×6): qty 40

## 2018-04-01 MED ORDER — LIDOCAINE HCL (PF) 1 % IJ SOLN
INTRAMUSCULAR | Status: DC | PRN
  Administered 2018-04-01: 10 mL

## 2018-04-01 MED ORDER — CLEVIDIPINE BUTYRATE 0.5 MG/ML IV EMUL: 0.0000 mg/h | INTRAVENOUS | Status: AC

## 2018-04-01 MED ORDER — ASPIRIN 81 MG PO CHEW
81.0000 mg | CHEWABLE_TABLET | Freq: Every day | ORAL | Status: DC
  Administered 2018-04-02 – 2018-04-10 (×9): 81 mg via ORAL
  Filled 2018-04-01 (×9): qty 1

## 2018-04-01 MED ORDER — PROPOFOL 10 MG/ML IV BOLUS
INTRAVENOUS | Status: AC
  Filled 2018-04-01: qty 20

## 2018-04-01 MED ORDER — CEFAZOLIN SODIUM-DEXTROSE 2-4 GM/100ML-% IV SOLN
2.0000 g | INTRAVENOUS | Status: DC
  Filled 2018-04-01: qty 100

## 2018-04-01 MED ORDER — HEPARIN (PORCINE) 25000 UT/250ML-% IV SOLN: 500.0000 [IU]/h | INTRAVENOUS | Status: DC

## 2018-04-01 MED ORDER — TICAGRELOR 90 MG PO TABS: 90.0000 mg | ORAL_TABLET | Freq: Two times a day (BID) | ORAL | Status: DC

## 2018-04-01 MED ORDER — LACTATED RINGERS IV SOLN
INTRAVENOUS | Status: DC | PRN
  Administered 2018-04-01 (×3): via INTRAVENOUS

## 2018-04-01 MED ORDER — SODIUM CHLORIDE 0.9 % IV SOLN: INTRAVENOUS | Status: DC

## 2018-04-01 MED ORDER — FENTANYL CITRATE (PF) 100 MCG/2ML IJ SOLN
INTRAMUSCULAR | Status: DC | PRN
  Administered 2018-04-01 (×2): 25 ug via INTRAVENOUS
  Administered 2018-04-01 (×4): 50 ug via INTRAVENOUS

## 2018-04-01 SURGICAL SUPPLY — 52 items
CANISTER SUCT 3000ML PPV (MISCELLANEOUS) ×3 IMPLANT
CANNULA VESSEL 3MM 2 BLNT TIP (CANNULA) ×3 IMPLANT
CLIP VESOCCLUDE MED 24/CT (CLIP) ×3 IMPLANT
CLIP VESOCCLUDE MED 6/CT (CLIP) ×3 IMPLANT
COVER WAND RF STERILE (DRAPES) ×3 IMPLANT
DERMABOND ADVANCED (GAUZE/BANDAGES/DRESSINGS) ×2
DERMABOND ADVANCED .7 DNX12 (GAUZE/BANDAGES/DRESSINGS) ×1 IMPLANT
DRAIN CHANNEL 19F RND (DRAIN) ×3 IMPLANT
DRAPE INCISE IOBAN 66X45 STRL (DRAPES) ×3 IMPLANT
DRAPE ORTHO SPLIT 77X108 STRL (DRAPES) ×2
DRAPE SURG ORHT 6 SPLT 77X108 (DRAPES) ×1 IMPLANT
DRESSING PEEL AND PLC PRVNA 13 (GAUZE/BANDAGES/DRESSINGS) ×1 IMPLANT
DRSG PEEL AND PLACE PREVENA 13 (GAUZE/BANDAGES/DRESSINGS) ×3
DRSG TEGADERM 4X4.75 (GAUZE/BANDAGES/DRESSINGS) ×3 IMPLANT
ELECT REM PT RETURN 9FT ADLT (ELECTROSURGICAL) ×3
ELECTRODE REM PT RTRN 9FT ADLT (ELECTROSURGICAL) ×1 IMPLANT
EVACUATOR SILICONE 100CC (DRAIN) ×3 IMPLANT
GAUZE SPONGE 4X4 12PLY STRL (GAUZE/BANDAGES/DRESSINGS) ×3 IMPLANT
GAUZE SPONGE 4X4 12PLY STRL LF (GAUZE/BANDAGES/DRESSINGS) ×3 IMPLANT
GLOVE BIO SURGEON STRL SZ 6.5 (GLOVE) ×2 IMPLANT
GLOVE BIO SURGEON STRL SZ7.5 (GLOVE) ×3 IMPLANT
GLOVE BIO SURGEONS STRL SZ 6.5 (GLOVE) ×1
GLOVE BIOGEL PI IND STRL 6.5 (GLOVE) ×1 IMPLANT
GLOVE BIOGEL PI IND STRL 7.0 (GLOVE) ×2 IMPLANT
GLOVE BIOGEL PI IND STRL 8.5 (GLOVE) ×1 IMPLANT
GLOVE BIOGEL PI INDICATOR 6.5 (GLOVE) ×2
GLOVE BIOGEL PI INDICATOR 7.0 (GLOVE) ×4
GLOVE BIOGEL PI INDICATOR 8.5 (GLOVE) ×2
GLOVE ECLIPSE 7.0 STRL STRAW (GLOVE) ×3 IMPLANT
GOWN STRL REUS W/ TWL LRG LVL3 (GOWN DISPOSABLE) ×1 IMPLANT
GOWN STRL REUS W/ TWL XL LVL3 (GOWN DISPOSABLE) ×1 IMPLANT
GOWN STRL REUS W/TWL LRG LVL3 (GOWN DISPOSABLE) ×2
GOWN STRL REUS W/TWL XL LVL3 (GOWN DISPOSABLE) ×2
KIT BASIN OR (CUSTOM PROCEDURE TRAY) ×3 IMPLANT
KIT DRSG PREVENA PLUS 7DAY 125 (MISCELLANEOUS) ×3 IMPLANT
KIT TURNOVER KIT B (KITS) ×3 IMPLANT
LOOP VESSEL MAXI BLUE (MISCELLANEOUS) ×3 IMPLANT
NS IRRIG 1000ML POUR BTL (IV SOLUTION) ×6 IMPLANT
PACK CV ACCESS (CUSTOM PROCEDURE TRAY) ×3 IMPLANT
PAD ARMBOARD 7.5X6 YLW CONV (MISCELLANEOUS) ×6 IMPLANT
SPONGE LAP 18X18 RF (DISPOSABLE) ×3 IMPLANT
SUT MNCRL AB 4-0 PS2 18 (SUTURE) ×3 IMPLANT
SUT PROLENE 5 0 C 1 24 (SUTURE) ×18 IMPLANT
SUT PROLENE 6 0 BV (SUTURE) ×3 IMPLANT
SUT SILK 2 0 SH (SUTURE) ×3 IMPLANT
SUT VIC AB 2-0 CTB1 (SUTURE) ×6 IMPLANT
SUT VIC AB 3-0 SH 27 (SUTURE) ×6
SUT VIC AB 3-0 SH 27X BRD (SUTURE) ×3 IMPLANT
TAPE CLOTH SURG 4X10 WHT LF (GAUZE/BANDAGES/DRESSINGS) ×3 IMPLANT
TOWEL GREEN STERILE (TOWEL DISPOSABLE) ×3 IMPLANT
UNDERPAD 30X30 (UNDERPADS AND DIAPERS) ×3 IMPLANT
WATER STERILE IRR 1000ML POUR (IV SOLUTION) ×3 IMPLANT

## 2018-04-01 NOTE — Progress Notes (Signed)
1600--MD Yacoub at bedside. Pt hypotensive. Neo initiated to increase BP. Verbal order given by MD Nelda Marseille to this RN to administer last unit of PRBC brought up with pt. Emergency blood administration initiated at 1700. Ended at 1850. See associated flowsheet.  1745--Noted increase in size and color of right groin site hematoma. Increased drainage from JP dressing. MD Deveshwar paged to inform. It was recommended to inform vascular. See associated flowsheet--MD notification.  1804--Paged MD Ruta Hinds on call with vascular to inform of increased hematoma size. MD Fields was on the unit and at the bedside with pt at 1820. Note by MD Oneida Alar recommends not initiating heparin for 24 hours.   1840--Paged and spoke with MD Deveshwar to inform of MD Fields recommendation. Verbal orders given to discontinue heparin gtt. Proceed with administering Brilenta. Place verbal orders in regards to P2 Y12 test in the morning + ASA.

## 2018-04-01 NOTE — Anesthesia Postprocedure Evaluation (Signed)
Anesthesia Post Note  Patient: Donald Siva  Procedure(s) Performed: EMBOLIZATION (N/A )     Patient location during evaluation: PACU Anesthesia Type: General Level of consciousness: awake and alert Pain management: pain level controlled Vital Signs Assessment: post-procedure vital signs reviewed and stable Respiratory status: spontaneous breathing, nonlabored ventilation, respiratory function stable and patient connected to nasal cannula oxygen Cardiovascular status: blood pressure returned to baseline and stable Postop Assessment: no apparent nausea or vomiting Anesthetic complications: no    Last Vitals:  Vitals:   03/29/2018 2030 03/15/2018 2045  BP: 112/76   Pulse: 76 79  Resp: 16 16  Temp:    SpO2: 100% 100%    Last Pain:  Vitals:   03/28/2018 1850  TempSrc: Oral  PainSc:                  Jdyn Parkerson

## 2018-04-01 NOTE — H&P (Deleted)
  The note originally documented on this encounter has been moved the the encounter in which it belongs.  

## 2018-04-01 NOTE — Sedation Documentation (Signed)
Patient under the care of anesthesia 

## 2018-04-01 NOTE — Op Note (Signed)
    NAMEJacquelynne Cox    MRN: [id number] DOB: 31-Dec-1960    DATE OF OPERATION: 03/09/2018  PREOP DIAGNOSIS:    Bleeding from right groin status post cerebral arteriogram  POSTOP DIAGNOSIS:    Same  PROCEDURE:    1.  Exploration of right groin for bleeding 2.  Repair of right deep femoral artery  SURGEON: Judeth Cornfield. Scot Dock, MD, FACS  ASSIST: Laurence Slate, PA  ANESTHESIA: General  EBL: Minimal  INDICATIONS:    Rebecca Cox is a 58 y.o. female who is undergoing a neurologic intervention today via a right femoral approach.  After the procedure she apparently had some neurologic changes and a code stroke was called.  They were evaluating the patient.  However in addition, she developed significant swelling in the right groin and became hypotensive.  Her hemoglobin dropped and she was unstable.  She was taken urgently to the operating room for exploration of the right groin.  FINDINGS:   The bleeding was from the deep femoral artery approximately 2 cm from the bifurcation.  This apparently was the site of cannulation.  This was repaired with two 5-0 Prolene's.  TECHNIQUE:   The patient was taken urgently to the operating room.  Patient received a general anesthetic.  The right groin and lower abdomen were prepped and draped in usual sterile fashion.  Given her obesity I elected to make an oblique incision just above the inguinal crease.  Dissection was carried down through hematoma to where I can identify the pulse in the superficial femoral artery.  The superficial femoral artery was controlled and I also identified the deep femoral artery and common femoral artery.  However there was significant bleeding from a deeper area lateral to the bifurcation.  I suspected that this must be a injury to the deep femoral artery distally.  In addition there was some venous bleeding was which was repaired with 5-0 Prolene's.  I was able to clamp the proximal deep femoral artery.  Of note I did  not give heparin given that she had a neurologic change after her neurological intervention.  Placed two 5-0 Prolene's to control the bleeding.  There was excellent hemostasis.  The wound was irrigated.  A 15 Blake drain was placed.  The wound was closed with 2 deep layers of 2-0 Vicryl, a subcutaneous layer of 3-0 Vicryl and the skin closed with 4-0 Vicryl.  A Praveena dressing was applied.  The patient tolerated the procedure well and was transferred to the recovery room in stable condition.  [name], MD, FACS [facility]  DATE OF DICTATION:   03/07/2018

## 2018-04-01 NOTE — Progress Notes (Signed)
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 Also concern for this being a watershed event with ACA/MCA watershed infarct.  Recommendations Blood pressures per vascular surgery/endovascular.  I would avoid hypotension. Antiplatelets when okay with endovascular and vascular surgery. Follow-up MRI/MRA report. Stroke work-up Stroke team to follow in the morning  -- Amie Portland, MD [facility] Pager: [phone] If 7pm to 7am, please call on call as listed on AMION.

## 2018-04-01 NOTE — Anesthesia Procedure Notes (Signed)

## 2018-04-01 NOTE — Progress Notes (Signed)
[facility] Progress Note Patient Name: Rebecca Cox DOB: 1961-02-27 MRN: [id number]   Date of Service  03/18/2018  HPI/Events of Note  Requesto for: 1. Gastric tube for medication administration and 2. Stress Ulcer Prophylaxis.  eICU Interventions  Will order: 1. Place OGT. 2. Abdominal X-ray post gastric tube placement.  3. Protonix IV.      Intervention Category Major Interventions: Other: Intermediate Interventions: Best-practice therapies (e.g. DVT, beta blocker, etc.)  Sommer,Steven Eugene 03/21/2018, 9:22 PM

## 2018-04-01 NOTE — H&P (Signed)
Chief Complaint: Patient was seen in consultation today for Cerebral arteriogram with anterior communicating artery aneurysm embolization at the request of  Dr Norton Blizzard   Supervising Physician: Luanne Bras  Patient Status: [facility] - Out-pt  History of Present Illness: Rebecca Cox is a 58 y.o. 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 Symptoms lasted approximately 10 minutes. She was admitted to the hospital for further evaluation. MRI imaging of the brain did not show any acute infarct. 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  Intracranial MRA: 1. 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 2. No emergent finding  Denies N/V Denies speech or vision changes Denies headache  Scheduled for cerebral arteriogram with possible ACOM aneurysm embolization  Past Medical History:  Diagnosis Date  . Agoraphobia   . Arthritis   . Cancer ([place])    liver  . Cerebral aneurysm   . Chronic kidney disease    stage 3  . Coronary artery disease   . Depression   . Diverticulitis   . Esophageal hernia   . Family history of adverse reaction to anesthesia    Pt suspects that " mother had cardiac arrest from anesthesia- not exactly sure"   . GERD (gastroesophageal reflux disease)   . Headache   . Sleep apnea    does not wear CPAP  . Thyroid nodule   . Wears glasses   . Wears partial dentures     Past Surgical History:  Procedure Laterality Date  . BARIATRIC SURGERY    . CARPAL TUNNEL RELEASE    . COLONOSCOPY W/ BIOPSIES AND POLYPECTOMY    . MULTIPLE TOOTH EXTRACTIONS    . TONSILLECTOMY       Allergies: Patient has no known allergies.  Medications: Prior to Admission medications   Medication Sig Start Date End Date Taking? Authorizing Provider  aspirin 325 MG EC tablet Take 325 mg by mouth daily. " And as needed if headache continues"    [provider]  B-COMPLEX-C PO Take 1 tablet by mouth daily.    [provider]  buPROPion (WELLBUTRIN SR) 150 MG 12 hr tablet Take 150 mg by mouth 2 (two) times daily.    [provider]  celecoxib (CELEBREX) 200 MG capsule Take 200 mg by mouth daily. 5pm    [provider]  clonazePAM (KLONOPIN) 0.5 MG tablet Take 0.5 mg by mouth 2 (two) times daily. 1 tablet in the morning and take 1 tablet at 5p. May take additional half-whole tablet throughout the day as needed for anxiety    [provider]  clopidogrel (PLAVIX) 75 MG tablet Take 75 mg by mouth daily.    [provider]  metoprolol succinate (TOPROL-XL) 25 MG 24 hr tablet Take 12.5 mg by mouth every morning.    [provider]  pantoprazole (PROTONIX) 40 MG tablet Take 40 mg by mouth every morning.    [provider]  polycarbophil (FIBERCON) 625 MG tablet Take 625 mg by mouth 2 (two) times daily.    [provider]  simvastatin (ZOCOR) 20 MG tablet Take 20 mg by mouth every morning.    [provider]  topiramate (  TOPAMAX) 25 MG tablet Take 25 mg by mouth daily. 25mg  at night for one week then 50 mg at night thereafter    [provider]  venlafaxine XR (EFFEXOR-XR) 150 MG 24 hr capsule Take 150 mg by mouth 2 (two) times daily.    [provider]     History reviewed. No pertinent family history.  Social History   Socioeconomic History  . Marital status: Married    Spouse name: Not on file  . Number of children: Not on file  . Years of education: Not on file  . Highest education level: Not on file  Occupational History  . Not on file  Social Needs  . Financial resource  strain: Not on file  . Food insecurity:    Worry: Not on file    Inability: Not on file  . Transportation needs:    Medical: Not on file    Non-medical: Not on file  Tobacco Use  . Smoking status: Current Every Day Smoker    Types: Cigarettes  . Smokeless tobacco: Never Used  . Tobacco comment: 2 cigarettes daily  Substance and Sexual Activity  . Alcohol use: Not Currently    Frequency: Never  . Drug use: Not Currently  . Sexual activity: Not on file  Lifestyle  . Physical activity:    Days per week: Not on file    Minutes per session: Not on file  . Stress: Not on file  Relationships  . Social connections:    Talks on phone: Not on file    Gets together: Not on file    Attends religious service: Not on file    Active member of club or organization: Not on file    Attends meetings of clubs or organizations: Not on file    Relationship status: Not on file  Other Topics Concern  . Not on file  Social History Narrative  . Not on file     Review of Systems: A 12 point ROS discussed and pertinent positives are indicated in the HPI above.  All other systems are negative.  Review of Systems  Constitutional: Negative for activity change, fatigue and fever.  HENT: Negative for tinnitus and trouble swallowing.   Eyes: Negative for visual disturbance.  Respiratory: Negative for cough and shortness of breath.   Cardiovascular: Negative for chest pain.  Gastrointestinal: Negative for abdominal pain.  Musculoskeletal: Negative for back pain, gait problem and neck pain.  Neurological: Negative for dizziness, tremors, seizures, syncope, facial asymmetry, speech difficulty, weakness, light-headedness, numbness and headaches.  Psychiatric/Behavioral: Negative for behavioral problems and confusion.    Vital Signs: There were no vitals taken for this visit.  Physical Exam Vitals signs reviewed.  Constitutional:      Appearance: Normal appearance.  HENT:     Head: Atraumatic.      Mouth/Throat:     Mouth: Mucous membranes are moist.  Eyes:     Extraocular Movements: Extraocular movements intact.  Neck:     Musculoskeletal: Normal range of motion.  Cardiovascular:     Rate and Rhythm: Normal rate and regular rhythm.  Pulmonary:     Effort: Pulmonary effort is normal.     Breath sounds: Normal breath sounds.  Abdominal:     Palpations: Abdomen is soft.     Tenderness: There is no abdominal tenderness.  Musculoskeletal: Normal range of motion.        General: No tenderness.  Skin:    General: Skin is warm.  Neurological:  General: No focal deficit present.     Mental Status: She is oriented to person, place, and time.     Motor: No weakness.  Psychiatric:        Mood and Affect: Mood normal.        Behavior: Behavior normal.        Thought Content: Thought content normal.        Judgment: Judgment normal.     Imaging: Ct Head Wo Contrast  Result Date: 03/10/2018 CLINICAL DATA:  Acute onset dizziness and difficulty walking today. EXAM: CT HEAD WITHOUT CONTRAST TECHNIQUE: Contiguous axial images were obtained from the base of the skull through the vertex without intravenous contrast. COMPARISON:  None. FINDINGS: Brain: No evidence of acute infarction, hemorrhage, hydrocephalus, extra-axial collection or mass lesion/mass effect. Vascular: No hyperdense vessel or unexpected calcification. Skull: Normal. Negative for fracture or focal lesion. Sinuses/Orbits: Negative. Other: None. IMPRESSION: Negative head CT. Electronically Signed   By: Inge Rise M.D.   On: 03/10/2018 12:09   Mr Brain Wo Contrast  Result Date: 03/11/2018 CLINICAL DATA:  Headache, new, malignancy suspected. Dizziness and weakness for 5 days EXAM: MRI HEAD WITHOUT CONTRAST MRA HEAD WITHOUT CONTRAST TECHNIQUE: Multiplanar, multiecho pulse sequences of the brain and surrounding structures were obtained without intravenous contrast. Angiographic images of the head were obtained using MRA  technique without contrast. COMPARISON:  None. FINDINGS: MRI HEAD FINDINGS Brain: No acute infarction, hemorrhage, hydrocephalus, extra-axial collection or mass lesion. Mild patchy FLAIR hyperintensity in the pons. Even milder periventricular FLAIR hyperintensity. Normal brain volume. Partially empty sella considered incidental in isolation. Vascular: Arterial findings below. Normal dural venous sinus flow voids. 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 No major branch occlusion or flow limiting stenosis. Negative for vessel beading. IMPRESSION: Brain MRI: 1. No acute finding or specific explanation for headache. 2. Mild signal abnormality in the pons and periventricular white matter that is usually from chronic small vessel ischemia. Intracranial MRA: 1. 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 2. No emergent finding. Electronically Signed   By: Monte Fantasia M.D.   On: 03/11/2018 09:08   Mr Brain W Contrast  Result Date: 03/12/2018 CLINICAL DATA:  Liver cancer. Headache. Rule out metastatic disease. EXAM: MRI HEAD WITH CONTRAST TECHNIQUE: Multiplanar, multiecho pulse sequences of the brain and surrounding structures were obtained with intravenous contrast. CONTRAST:  10 mL Gadovist IV COMPARISON:  Unenhanced MRI head 03/11/2018 FINDINGS: Normal enhancement. No enhancing mass lesion. Leptomeningeal enhancement normal. No acute abnormality. See recent unenhanced MRI report. IMPRESSION: No acute abnormality and negative for intracranial metastatic disease. Electronically Signed   By: Franchot Gallo M.D.   On: 03/12/2018 11:16   US Carotid Bilateral (at [facility] And [facility] Only)  Result Date: 03/11/2018 CLINICAL DATA:  58 year old female with history of vertigo  EXAM: BILATERAL CAROTID DUPLEX ULTRASOUND TECHNIQUE: Pearline Cables scale imaging, color Doppler and duplex ultrasound were performed of bilateral carotid and vertebral arteries in the neck. COMPARISON:  None. FINDINGS: Criteria: Quantification of carotid stenosis is based on velocity parameters that correlate the residual internal carotid diameter with NASCET-based stenosis levels, using the diameter of the distal internal carotid lumen as the denominator for stenosis measurement. The following velocity measurements were obtained: RIGHT ICA:  Systolic 91 cm/sec, Diastolic  34 cm/sec CCA:  222 cm/sec SYSTOLIC ICA/CCA RATIO:  0.9 ECA:  96 cm/sec LEFT ICA:  Systolic 78 cm/sec, Diastolic 38 cm/sec CCA:  93 cm/sec SYSTOLIC ICA/CCA RATIO:  0.8 ECA:  77 cm/sec Right Brachial SBP: Not acquired Left Brachial SBP: Not acquired RIGHT CAROTID ARTERY: No significant calcified disease of the right common carotid artery. Intermediate waveform maintained. Heterogeneous plaque without significant calcifications at the right carotid bifurcation. Low resistance waveform of the right ICA. No significant tortuosity. RIGHT VERTEBRAL ARTERY: Antegrade flow with low resistance waveform. LEFT CAROTID ARTERY: No significant calcified disease of the left common carotid artery. Intermediate waveform maintained. Heterogeneous plaque at the left carotid bifurcation without significant calcifications. Low resistance waveform of the left ICA. LEFT VERTEBRAL ARTERY:  Antegrade flow with low resistance waveform. Additional: Right thyroid nodule measures 1.8 cm. IMPRESSION: Color duplex indicates minimal heterogeneous plaque, with no hemodynamically significant stenosis by duplex criteria in the extracranial cerebrovascular circulation. Incidental finding of right thyroid nodule measuring 1.8 cm. Dedicated thyroid ultrasound recommended if further characterisation warranted. Signed, Dulcy Fanny. Dellia Nims, RPVI [facility]  [facility] Electronically Signed   By: Corrie Mckusick D.O.   On: 03/11/2018 09:41   Dg Fluoro Guided Needle Plc Aspiration/injection Loc  Result Date: 03/12/2018 CLINICAL DATA:  Headaches, dizziness, difficulty controlling legs EXAM: DIAGNOSTIC LUMBAR PUNCTURE UNDER FLUOROSCOPIC GUIDANCE FLUOROSCOPY TIME:  Fluoroscopy Time:  0 minutes 12 seconds Radiation Exposure Index (if provided by the fluoroscopic device): 3.4 mGy Number of Acquired Spot Images: 2 PROCEDURE: Procedure, benefits, and risks were discussed with the patient, including alternatives. Patient's questions were answered. Written informed consent was obtained. Timeout protocol followed. Patient placed prone. L4-L5 disc space was localized under fluoroscopy. Skin prepped and draped in usual sterile fashion. Skin and soft tissues anesthetized with 3 mL of 1% lidocaine. 22 gauge needle was advanced into the spinal canal where clear colorless CSF was encountered. 10 mL of CSF was obtained in 4 tubes for requested analysis. Procedure tolerated very well by patient without immediate complication. IMPRESSION: Fluoroscopic guided lumbar puncture as above. Electronically Signed   By: Lavonia Dana M.D.   On: 03/12/2018 15:08   Mr Jodene Nam Head/brain LN Cm  Result Date: 03/11/2018 CLINICAL DATA:  Headache, new, malignancy suspected. Dizziness and weakness for 5 days EXAM: MRI HEAD WITHOUT CONTRAST MRA HEAD WITHOUT CONTRAST TECHNIQUE: Multiplanar, multiecho pulse sequences of the brain and surrounding structures were obtained without intravenous contrast. Angiographic images of the head were obtained using MRA technique without contrast. COMPARISON:  None. FINDINGS: MRI HEAD FINDINGS Brain: No acute infarction, hemorrhage, hydrocephalus, extra-axial collection or mass lesion. Mild patchy FLAIR hyperintensity in the pons. Even milder periventricular FLAIR hyperintensity. Normal brain volume. Partially empty sella considered incidental in isolation. Vascular:  Arterial findings below. Normal dural venous sinus flow voids. 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 No major branch occlusion or flow limiting stenosis. Negative for vessel beading. IMPRESSION: Brain MRI: 1. No acute finding or specific explanation for headache. 2. Mild signal abnormality in the pons and periventricular white matter that is usually from chronic small vessel ischemia. Intracranial MRA: 1. 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 2. No emergent finding. Electronically Signed   By: Neva Seat.D.  On: 03/11/2018 09:08    Labs:  CBC: Recent Labs    03/10/18 1058 03/10/2018 0653  WBC 6.5 7.3  HGB 13.2 12.8  HCT 43.3 42.9  PLT 217 212    COAGS: Recent Labs    03/10/18 1058 04/04/2018 0653  INR 0.97 1.03  APTT 25  --     BMP: Recent Labs    03/10/18 1058 03/11/18 2024 03/29/2018 0653  NA 139 139 141  K 3.9 4.1 4.1  CL 110 113* 111  CO2 25 23 21*  GLUCOSE 98 77 83  BUN 20 22* 16  CALCIUM 9.4 8.9 9.8  CREATININE 0.97 1.03* 1.30*  GFRNONAA >60 >60 46*  GFRAA >60 >60 53*    LIVER FUNCTION TESTS: Recent Labs    03/10/18 1058 03/11/18 2024  BILITOT 0.5 0.3  AST 18 16  ALT 17 14  ALKPHOS 101 80  PROT 6.9 5.7*  ALBUMIN 3.8 3.2*    TUMOR MARKERS: No results for input(s): AFPTM, CEA, CA199, CHROMGRNA in the last 8760 hours.  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  This interventional procedure involves the use of X-rays and because of the nature of the planned procedure, it is possible that we will have prolonged use of X-ray fluoroscopy.  Potential radiation risks to you include (but are not limited to) the following: - A slightly elevated risk for cancer  several years later in life. This risk is typically less than 0.5% percent. This risk is low in comparison to the normal incidence of human cancer, which is 33% for women and 50% for men according to the [employer]. - Radiation induced injury can include skin redness, resembling a rash, tissue breakdown / ulcers and hair loss (which can be temporary or permanent).   The likelihood of either of these occurring depends on the difficulty of the procedure and whether you are sensitive to radiation due to previous procedures, disease, or genetic conditions.   IF your procedure requires a prolonged use of radiation, you will be notified and given written instructions for further action.  It is your responsibility to monitor the irradiated area for the 2 weeks following the procedure and to notify your physician if you are concerned that you have suffered a radiation induced injury.    All of the patient's questions were answered, patient is agreeable to proceed.  Consent signed and in chart.  Pt is aware if intervention is performed-- she will be admitted overnight for observastion in Neuro ICU Plan for discharge in am Agreeable to proceed   Thank you for this interesting consult.  I greatly enjoyed meeting Donald Siva and look forward to participating in their care.  A copy of this report was sent to the requesting provider on this date.  Electronically Signed: Lavonia Drafts, PA-C 03/13/2018, 7:23 AM   I spent a total of    40 Minutes in face to face in clinical consultation,  greater than 50% of which was counseling/coordinating care for [facility] aneurysm embolization

## 2018-04-01 NOTE — Progress Notes (Addendum)
ANTICOAGULATION CONSULT NOTE - Initial Consult  Pharmacy Consult for heparin Indication: Post Interventional Neuroradiology procedure   No Known Allergies  Patient Measurements: Height: 5\' 5"  (165.1 cm) Weight: 210 lb (95.3 kg) IBW/kg (Calculated) : 57 Heparin Dosing Weight: 78 kg   Vital Signs: Temp: 97.7 F (36.5 C) (01/27 0621) Temp Source: Oral (01/27 0621) BP: 117/76 (01/27 0835) Pulse Rate: 74 (01/27 0835)  Labs: Recent Labs    03/20/2018 0653 03/25/2018 1141 03/25/2018 1214  HGB 12.8 8.2* 6.1*  HCT 42.9 24.0* 18.0*  PLT 212  --   --   LABPROT 13.4  --   --   INR 1.03  --   --   CREATININE 1.30*  --   --     Estimated Creatinine Clearance: 55.8 mL/min (A) (by C-G formula based on SCr of 1.3 mg/dL (H)).   Medical History: Past Medical History:  Diagnosis Date  . Agoraphobia   . Arthritis   . Cancer ([place])    liver  . Cerebral aneurysm   . Chronic kidney disease    stage 3  . Coronary artery disease   . Depression   . Diverticulitis   . Esophageal hernia   . Family history of adverse reaction to anesthesia    Pt suspects that " mother had cardiac arrest from anesthesia- not exactly sure"   . GERD (gastroesophageal reflux disease)   . Headache   . Sleep apnea    does not wear CPAP  . Thyroid nodule   . Wears glasses   . Wears partial dentures     Medications:  Medications Prior to Admission  Medication Sig Dispense Refill Last Dose  . aspirin 325 MG EC tablet Take 325 mg by mouth daily. " And as needed if headache continues"   03/22/2018 at 0510  . B-COMPLEX-C PO Take 1 tablet by mouth daily.   Past Week at Unknown time  . buPROPion (WELLBUTRIN SR) 150 MG 12 hr tablet Take 150 mg by mouth 2 (two) times daily.   03/08/2018 at [place]  . celecoxib (CELEBREX) 200 MG capsule Take 200 mg by mouth daily. 5pm   Past Week at Unknown time  . clonazePAM (KLONOPIN) 0.5 MG tablet Take 0.5 mg by mouth 2 (two) times daily. 1 tablet in the morning and take 1 tablet at 5p.  May take additional half-whole tablet throughout the day as needed for anxiety   03/14/2018 at 0510  . clopidogrel (PLAVIX) 75 MG tablet Take 75 mg by mouth daily.   03/21/2018 at [place]  . metoprolol succinate (TOPROL-XL) 25 MG 24 hr tablet Take 12.5 mg by mouth every morning.   03/19/2018 at [place]  . pantoprazole (PROTONIX) 40 MG tablet Take 40 mg by mouth every morning.   03/12/2018 at [place]  . polycarbophil (FIBERCON) 625 MG tablet Take 625 mg by mouth 2 (two) times daily.   04/05/2018 at 0510  . simvastatin (ZOCOR) 20 MG tablet Take 20 mg by mouth every morning.   04/03/2018 at [place]  . topiramate (TOPAMAX) 25 MG tablet Take 25 mg by mouth daily. 25mg  at night for one week then 50 mg at night thereafter   03/31/2018 at Unknown time  . venlafaxine XR (EFFEXOR-XR) 150 MG 24 hr capsule Take 150 mg by mouth 2 (two) times daily.   03/16/2018 at 0510    Assessment: 69 YOF s/p IR for aneurysm embolism to start IV heparin. Of note, patient developed a R groin hematoma and required surgery with hemostasis.  Discussed case with neurointerventional team and vascular surgery. Surgery deferred anticoagulation to IR. IR approved a low dose. Patient has received 3 units of blood so and is about to receive a final fourth unit.   Goal of Therapy:  Heparin level 0.1-0.25 units/ml Monitor platelets by anticoagulation protocol: Yes   Plan:  -Start low dose heparin infusion at 500 units/hr -Given risk for bleeding, will refrain from increasing dose  -Stop IV heparin at 0800 tomorrow for sheath removal   Albertina Parr, PharmD., BCPS Clinical Pharmacist Clinical phone for 03/23/2018 until 10:30pm: [phone] If after 10:30pm, please refer to [facility]ION for unit-specific pharmacist   Addendum: Pt had some hematoma expansion and therefore IV heparin was not started per vascular surgery. Will d/c'ed heparin level for tomorrow.   Albertina Parr, PharmD., BCPS Clinical Pharmacist

## 2018-04-01 NOTE — Progress Notes (Signed)
Patient ID: Rebecca Cox, female   DOB: Aug 30, 1960, 58 y.o.   MRN: [id number] INR. Post treatment extubated.. Maintaining O2 sats . C/O a headache . No N/V visual or motor difficulties.  Pupils 66mm RT = Lt. Responding appropriately. Moves Lt arm and RT leg. Difficulty with moving RT arm and Lt leg. Code stroke called. Patient evaluated by neurology stroke service.  Whilst holding  Manual pressure in the rt groin noted to develop expanding hematoma despite constant pressure. Drop in BP to 85/50s with sinus tachycardia. requiring resuscitation with fluids and vasopressors. Distal pulses patent.. Vascular surgery consult requested and evaluated. Plan . Surgical exploration of RT groin  Followed by MRI brain. Stroke team in agreement. D/W patients husband. S.Silas Sedam MD

## 2018-04-01 NOTE — Consult Note (Signed)
Hospital Consult    Reason for Consult: Expanding right groin hematoma status post right common femoral access for IR intervention Referring Physician: Dr. Estanislado Pandy MRN #:  [id number]  History of Present Illness: This is a 58 y.o. female with history of chronic kidney disease, coronary artery disease, and anterior communicating aneurysm that vascular surgery was called emergently to IR suite to evaluate for an expanding right groin hematoma.  Patient had right common femoral artery access with a 6 French sheath by Dr. Estanislado Pandy for a cerebral intervention for anterior communicating aneurysm.  After the procedure the sheath was pulled and heparin was partially reversed and pressure was held.  No closure device.  The team had been holding pressure for over 1 hour and 15 minutes and patient continued to have an expansive hematoma in the right groin and became hypotensive with systolics in the 33I requiring Neo-Synephrine at 75 mcg.  On my evaluation patient complains of right groin pain and when manual pressure was removed in the right groin there was noted to be expansion of the hematoma that extended into the proximal thigh.  Also of note after the intervention the patient had no movement of the right arm and left leg and a code stroke was called.  Team reports palpable pulse in right foot prior to procedure.  Past Medical History:  Diagnosis Date  . Agoraphobia   . Arthritis   . Cancer ([place])    liver  . Cerebral aneurysm   . Chronic kidney disease    stage 3  . Coronary artery disease   . Depression   . Diverticulitis   . Esophageal hernia   . Family history of adverse reaction to anesthesia    Pt suspects that " mother had cardiac arrest from anesthesia- not exactly sure"   . GERD (gastroesophageal reflux disease)   . Headache   . Sleep apnea    does not wear CPAP  . Thyroid nodule   . Wears glasses   . Wears partial dentures     Past Surgical History:  Procedure Laterality Date   . BARIATRIC SURGERY    . CARPAL TUNNEL RELEASE    . COLONOSCOPY W/ BIOPSIES AND POLYPECTOMY    . MULTIPLE TOOTH EXTRACTIONS    . TONSILLECTOMY      No Known Allergies  Prior to Admission medications   Medication Sig Start Date End Date Taking? Authorizing Provider  aspirin 325 MG EC tablet Take 325 mg by mouth daily. " And as needed if headache continues"   Yes [provider]  B-COMPLEX-C PO Take 1 tablet by mouth daily.   Yes [provider]  buPROPion (WELLBUTRIN SR) 150 MG 12 hr tablet Take 150 mg by mouth 2 (two) times daily.   Yes [provider]  celecoxib (CELEBREX) 200 MG capsule Take 200 mg by mouth daily. 5pm   Yes [provider]  clonazePAM (KLONOPIN) 0.5 MG tablet Take 0.5 mg by mouth 2 (two) times daily. 1 tablet in the morning and take 1 tablet at 5p. May take additional half-whole tablet throughout the day as needed for anxiety   Yes [provider]  clopidogrel (PLAVIX) 75 MG tablet Take 75 mg by mouth daily.   Yes [provider]  metoprolol succinate (TOPROL-XL) 25 MG 24 hr tablet Take 12.5 mg by mouth every morning.   Yes [provider]  pantoprazole (PROTONIX) 40 MG tablet Take 40 mg by mouth every morning.   Yes [provider]  polycarbophil (FIBERCON) 625 MG tablet Take 625 mg by mouth 2 (two) times daily.   Yes [provider]  simvastatin (ZOCOR) 20 MG tablet Take 20 mg by mouth every morning.   Yes [provider]  topiramate (TOPAMAX) 25 MG tablet Take 25 mg by mouth daily. 25mg  at night for one week then 50 mg at night thereafter   Yes [provider]  venlafaxine XR (EFFEXOR-XR) 150 MG 24 hr capsule Take 150 mg by mouth 2 (two) times daily.   Yes [provider]    Social History   Socioeconomic History  . Marital status: Married    Spouse name: Not on file  . Number of children: Not on file  . Years of education: Not on file  . Highest  education level: Not on file  Occupational History  . Not on file  Social Needs  . Financial resource strain: Not on file  . Food insecurity:    Worry: Not on file    Inability: Not on file  . Transportation needs:    Medical: Not on file    Non-medical: Not on file  Tobacco Use  . Smoking status: Current Every Day Smoker    Types: Cigarettes  . Smokeless tobacco: Never Used  . Tobacco comment: 2 cigarettes daily  Substance and Sexual Activity  . Alcohol use: Not Currently    Frequency: Never  . Drug use: Not Currently  . Sexual activity: Not on file  Lifestyle  . Physical activity:    Days per week: Not on file    Minutes per session: Not on file  . Stress: Not on file  Relationships  . Social connections:    Talks on phone: Not on file    Gets together: Not on file    Attends religious service: Not on file    Active member of club or organization: Not on file    Attends meetings of clubs or organizations: Not on file    Relationship status: Not on file  . Intimate partner violence:    Fear of current or ex partner: Not on file    Emotionally abused: Not on file    Physically abused: Not on file    Forced sexual activity: Not on file  Other Topics Concern  . Not on file  Social History Narrative  . Not on file     History reviewed. No pertinent family history.  ROS: [x]  Positive   [ ]  Negative   [ ]  All sytems reviewed and are negative  Cardiovascular: []  chest pain/pressure []  palpitations []  SOB lying flat []  DOE []  pain in legs while walking []  pain in legs at rest []  pain in legs at night []  non-healing ulcers []  hx of DVT []  swelling in legs  Pulmonary: []  productive cough []  asthma/wheezing []  home O2  Neurologic: []  weakness in []  arms []  legs []  numbness in []  arms []  legs []  hx of CVA []  mini stroke [] difficulty speaking or slurred speech []  temporary loss of vision in one eye []  dizziness  Hematologic: []  hx of cancer []  bleeding  problems []  problems with blood clotting easily  Endocrine:   []  diabetes []  thyroid disease  GI []  vomiting blood []  blood in stool  GU: []  CKD/renal failure []  HD--[]  M/W/F or []  T/T/S []  burning with urination []  blood in urine  Psychiatric: []  anxiety []  depression  Musculoskeletal: []  arthritis []  joint pain  Integumentary: []  rashes []  ulcers  Constitutional: []   fever []  chills   Physical Examination  Vitals:   03/24/2018 0621  BP: 102/78  Pulse: 72  Resp: 16  Temp: 97.7 F (36.5 C)  SpO2: 97%   Body mass index is 34.95 kg/m.  General:  Mild distress Gait: Not observed Pulmonary: normal non-labored breathing, without Rales, rhonchi,  wheezing Cardiac: RRR Abdomen: soft, NT/ND Vascular Exam/Pulses: Eccymosis in right groin with expanding hematoma when manual pressure was removed - difficult to appreciate right femoral pulse given hematoma and obesity Extremities: without ischemic changes, without Gangrene , without cellulitis; without open wounds;  Musculoskeletal: no muscle wasting or atrophy  Neurologic: No movement of right arm and left leg.  CBC    Component Value Date/Time   WBC 7.3 04/05/2018 0653   RBC 4.68 04/02/2018 0653   HGB 12.8 03/23/2018 0653   HCT 42.9 03/31/2018 0653   PLT 212 03/06/2018 0653   MCV 91.7 03/20/2018 0653   MCH 27.4 03/21/2018 0653   MCHC 29.8 (L) 03/20/2018 0653   RDW 13.3 03/18/2018 0653   LYMPHSABS 1.8 03/06/2018 0653   MONOABS 0.3 03/18/2018 0653   EOSABS 0.2 03/07/2018 0653   BASOSABS 0.1 03/26/2018 0653    BMET    Component Value Date/Time   NA 141 03/11/2018 0653   K 4.1 03/27/2018 0653   CL 111 04/04/2018 0653   CO2 21 (L) 03/10/2018 0653   GLUCOSE 83 03/12/2018 0653   BUN 16 04/02/2018 0653   CREATININE 1.30 (H) 03/16/2018 0653   CALCIUM 9.8 04/02/2018 0653   GFRNONAA 46 (L) 03/07/2018 0653   GFRAA 53 (L) 03/20/2018 0653    COAGS: Lab Results  Component Value Date   INR 1.03 03/17/2018    INR 0.97 03/10/2018     Non-Invasive Vascular Imaging:    None   ASSESSMENT/PLAN: This is a 58 y.o. female status post right common femoral artery access for IR intervention on an anterior communicating artery aneurysm.  Patient was noted to have expanding right groin hematoma post procedure after the sheath was pulled and pressure had been held over an hour.  This is complicated by the fact that the patient has no movement in the right arm and left leg and a code stroke was called.  On my evaluation the team had been holding pressure for 1 hour 15 minutes in the right groin and she had dropped her hemoglobin from 11 to 6 and was on 75 mcg of neo.  When we removed pressure from the right groin in the IR suite she had notable expansion of the hematoma in her right groin.  We have recommended exploration of her right common femoral artery in the OR and likely primary repair.  Discussed with patients husband.  Marty Heck, MD [facility] Office: [phone] Pager: [place]

## 2018-04-01 NOTE — Procedures (Signed)
S/P 4 vessel cerebral arteriogram followed by staged embolization of wide neck 5.83mm x 3.3 mm Bilobed ACOM aneurysm using an ATLAS neuroform stent.

## 2018-04-01 NOTE — Anesthesia Procedure Notes (Signed)
Arterial Line Insertion Start/End01/07/2018 7:45 AM, 04/01/2018 8:00 AM Performed by: Lavell Luster, CRNA, CRNA  Patient location: Pre-op. Preanesthetic checklist: patient identified, IV checked, site marked, risks and benefits discussed, surgical consent, monitors and equipment checked, pre-op evaluation and timeout performed Lidocaine 1% used for infiltration Right, radial was placed Catheter size: 20 G Hand hygiene performed , maximum sterile barriers used  and Seldinger technique used Allen's test indicative of satisfactory collateral circulation Attempts: 2 Procedure performed without using ultrasound guided technique. Following insertion, Biopatch and dressing applied. Post procedure assessment: normal  Patient tolerated the procedure well with no immediate complications.

## 2018-04-01 NOTE — Progress Notes (Signed)
Called to see pt for possible expanding hematoma right groin   Vitals:   03/26/2018 1630 03/08/2018 1700 03/29/2018 1715 03/23/2018 1730  BP: (!) 111/99 126/87 115/84 117/82  Pulse: 92 82 88 83  Resp: 15 (!) 22 (!) 22 17  Temp: (!) 97.5 F (36.4 C)  (!) 97.3 F (36.3 C)   TempSrc: Oral  Oral   SpO2: 100% 100% 100% 100%  Weight:      Height:       Right groin has extensive ecchymosis, minimal drain output, no tense hematoma  Will check CBC but I do not believe she has reaccumulated her hematoma  Apparent Neuro plan is Brilinta/ASA possibly heparin.  I would hold on heparin unless absolutely necessary for at least 24 hours to make sure we have good hemostasis in groin.  Ruta Hinds, MD [facility] Office: [phone] Pager: [phone]

## 2018-04-01 NOTE — Anesthesia Procedure Notes (Signed)
Procedure Name: Intubation Date/Time: 03/23/2018 12:53 PM Performed by: Lavell Luster, CRNA Pre-anesthesia Checklist: Patient identified, Emergency Drugs available, Suction available and Patient being monitored Patient Re-evaluated:Patient Re-evaluated prior to induction Oxygen Delivery Method: Circle System Utilized, Circle system utilized, Simple face mask and Non-rebreather mask Preoxygenation: Pre-oxygenation with 100% oxygen Induction Type: IV induction Ventilation: Mask ventilation without difficulty Laryngoscope Size: Mac and 3 Grade View: Grade I Tube type: Oral Tube size: 7.5 mm Number of attempts: 1 Airway Equipment and Method: Stylet and Oral airway Placement Confirmation: ETT inserted through vocal cords under direct vision,  positive ETCO2 and breath sounds checked- equal and bilateral Tube secured with: Tape Dental Injury: Teeth and Oropharynx as per pre-operative assessment

## 2018-04-01 NOTE — Consult Note (Addendum)
NAME:  Allyna Pittsley, MRN:  [id number], DOB:  1960/03/13, LOS: 0 ADMISSION DATE:  03/11/2018, CONSULTATION DATE: 03/15/2018 REFERRING MD:  Patrecia Pour, CHIEF COMPLAINT: Acute respiratory failure  Brief History   This is a 58 year old female with history of liver cancer and cerebral aneurysm who presents to PCCM for vent management postop.  Patient have the aneurysm coiled in during the procedure developed a bifrontal CVA and a right groin hematoma.  She returned to the OR for evacuation of the hematoma and repair of the vessel.  Heparin was reversed and patient was transfused 2 units of packed red blood cells.  History of present illness   This is a 58 year old female with history of CKD, CAD, liver cancer, agoraphobia, generalized anxiety disorder, and anterior communicating aneurysm who presented to [facility] on 1/27 for a communicating aneurysm embolization.  They used the right common femoral artery for access.  Following the procedure a code stroke was called for right arm and left leg weakness, she developed a right femoral area hematoma despite constant pressure, and she became hypotensive down to 85 over 50s with sinus tachycardia requiring fluid resuscitation.Marland Kitchen  She was sent to the OR to control bleeding from the right groin hematoma.  Neurology was consulted code stroke and recommend continuing antiplatelets telemetry, checking A1c and lipid panel obtaining CT head and neck and MRI when more stable..  Patient was intubated for the hematoma evacuation procedure.  During that time a H&H showed a hemoglobin of 6.1 and hematocrit of 18.  Patient was transfused 2 units of PRBC.  Past Medical History   Past Medical History:  Diagnosis Date  . Agoraphobia   . Arthritis   . Cancer ([place])    liver  . Cerebral aneurysm   . Chronic kidney disease    stage 3  . Coronary artery disease   . Depression   . Diverticulitis   . Esophageal hernia   . Family history of adverse reaction to anesthesia    Pt suspects that " mother had cardiac arrest from anesthesia- not exactly sure"   . GERD (gastroesophageal reflux disease)   . Headache   . Sleep apnea    does not wear CPAP  . Thyroid nodule   . Wears glasses   . Wears partial dentures      Significant Hospital Events   1/27 > anterior communicating aneurysm embolization, post procedure code stroke, right femoral hematoma  Consults:  PCCM Neurology  Procedures:  1/27 >  anterior communicating aneurysm embolization 1/27 > intubation  Significant Diagnostic Tests:   MR I 03/29/2018 IMPRESSION: Bilateral watershed distribution cortical and subcortical edema and restricted diffusion, more extensive on the left than the right. Differential diagnosis is true watershed infarction versus vaso regulatory disturbance secondary to arterial spasm, which is demonstrated in the anterior cerebral vessels by MR angiography.  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 Micro Data:    Antimicrobials:     Interim history/subjective:  Patient is currently intubated and sedated  Objective   Blood pressure 102/78, pulse 72, temperature 97.7 F (36.5 C), temperature source Oral, resp. rate 16, height 5\' 5"  (1.651 m), weight 95.3 kg, SpO2 97 %.        Intake/Output Summary (Last 24 hours) at 03/09/2018 1543 Last data filed at 03/09/2018 1402 Gross per 24 hour  Intake 4245 ml  Output 250 ml  Net 3995 ml   Filed Weights   03/25/2018 0621  Weight: 95.3 kg  Examination: General: Intubated, sedated, not following commands HENT: Atraumatic, normocephalic, dilated pupils Lungs: Bilateral rhonchi, normal work of breathing, on ventilator Cardiovascular: Decreased heart sounds, regular rate and rhythm, no murmurs rubs or gallops Abdomen: Soft, no guarding, positive bowel sounds, wound VAC over right femoral area in place with bloody output Extremities: Trace bilateral lower extremity edema, warm  extremities, 2+ pulses, A-line in place Neuro: Sedated, not following commands, dilated pupils GU: Foley in place  Resolved Hospital Problem list     Assessment & Plan:   Acute respiratory failure Intubated for hematoma evacuation Status post anterior communicating aneurysm embolization - Keep intubated at this time.  Continue current vent settings. - Daily SBT and sedation vacation - Continue fentanyl lfor RASS of -1 - May be a candidate for extubation tomorrow pending improvement in her mental status and improvement in her left groin hematoma - Pulmonary hygiene - Chest x-ray now - Chest x-ray in a.m. - ABG, CBC, and BMP  Anemia secondary to right groin hematoma  -Hemoglobin dropped down to 6.1.  Patient was transfused 2 units of packed red blood cells. -Repeat CBC now -Transfuse for hgb <7 -Daily CBC  CVA in watershed area secondary to hypotension during procedure - Neurology following - MRI when more stable  Best practice:  Diet: N.p.o. Pain/Anxiety/Delirium protocol (if indicated): Fentanyl VAP protocol (if indicated): Yes DVT prophylaxis: Heparin Plavix per vascular GI prophylaxis: None Glucose control: Monitor Mobility: Bedrest Code Status: Full by default Family Communication:  Disposition: Remain in ICU  Labs   CBC:  Recent Labs  Lab 03/15/2018 0653 03/16/2018 1141 03/31/2018 1214  WBC 7.3  --   --   NEUTROABS 4.9  --   --   HGB 12.8 8.2* 6.1*  HCT 42.9 24.0* 18.0*  MCV 91.7  --   --   PLT 212  --   --     Basic Metabolic Panel: Recent Labs  Lab 04/04/2018 0653 03/28/2018 1141 03/31/2018 1214  NA 141 143 144  K 4.1 4.2 3.8  CL 111  --   --   CO2 21*  --   --   GLUCOSE 83 125*  --   BUN 16  --   --   CREATININE 1.30*  --   --   CALCIUM 9.8  --   --    GFR: Estimated Creatinine Clearance: 55.8 mL/min (A) (by C-G formula based on SCr of 1.3 mg/dL (H)). Recent Labs  Lab 04/02/2018 0653  WBC 7.3    Liver Function Tests: No results for input(s):  AST, ALT, ALKPHOS, BILITOT, PROT, ALBUMIN in the last 168 hours. No results for input(s): LIPASE, AMYLASE in the last 168 hours. No results for input(s): AMMONIA in the last 168 hours.  ABG    Component Value Date/Time   PHART 7.291 (L) 04/05/2018 1214   PCO2ART 34.2 03/20/2018 1214   PO2ART 181.0 (H) 03/26/2018 1214   HCO3 16.5 (L) 03/16/2018 1214   TCO2 17 (L) 04/05/2018 1214   ACIDBASEDEF 9.0 (H) 03/13/2018 1214   O2SAT 99.0 03/20/2018 1214     Coagulation Profile: Recent Labs  Lab 03/28/2018 0653  INR 1.03    Cardiac Enzymes: No results for input(s): CKTOTAL, CKMB, CKMBINDEX, TROPONINI in the last 168 hours.  HbA1C: Hgb A1c MFr Bld  Date/Time Value Ref Range Status  03/11/2018 06:22 AM 4.7 (L) 4.8 - 5.6 % Final    Comment:    (NOTE) Pre diabetes:  5.7%-6.4% Diabetes:              >6.4% Glycemic control for   <7.0% adults with diabetes     CBG: No results for input(s): GLUCAP in the last 168 hours.  Review of Systems:   Unable to obtain due to patients mental status  Past Medical History  She,  has a past medical history of Agoraphobia, Arthritis, Cancer ([place]), Cerebral aneurysm, Chronic kidney disease, Coronary artery disease, Depression, Diverticulitis, Esophageal hernia, Family history of adverse reaction to anesthesia, GERD (gastroesophageal reflux disease), Headache, Sleep apnea, Thyroid nodule, Wears glasses, and Wears partial dentures.   Surgical History    Past Surgical History:  Procedure Laterality Date  . BARIATRIC SURGERY    . CARPAL TUNNEL RELEASE    . COLONOSCOPY W/ BIOPSIES AND POLYPECTOMY    . MULTIPLE TOOTH EXTRACTIONS    . TONSILLECTOMY       Social History   reports that she has been smoking cigarettes. She has never used smokeless tobacco. She reports previous alcohol use. She reports previous drug use.   Family History   Her family history is not on file.   Allergies No Known Allergies   Home Medications  Prior to  Admission medications   Medication Sig Start Date End Date Taking? Authorizing Provider  aspirin 325 MG EC tablet Take 325 mg by mouth daily. " And as needed if headache continues"   Yes [provider]  B-COMPLEX-C PO Take 1 tablet by mouth daily.   Yes [provider]  buPROPion (WELLBUTRIN SR) 150 MG 12 hr tablet Take 150 mg by mouth 2 (two) times daily.   Yes [provider]  celecoxib (CELEBREX) 200 MG capsule Take 200 mg by mouth daily. 5pm   Yes [provider]  clonazePAM (KLONOPIN) 0.5 MG tablet Take 0.5 mg by mouth 2 (two) times daily. 1 tablet in the morning and take 1 tablet at 5p. May take additional half-whole tablet throughout the day as needed for anxiety   Yes [provider]  clopidogrel (PLAVIX) 75 MG tablet Take 75 mg by mouth daily.   Yes [provider]  metoprolol succinate (TOPROL-XL) 25 MG 24 hr tablet Take 12.5 mg by mouth every morning.   Yes [provider]  pantoprazole (PROTONIX) 40 MG tablet Take 40 mg by mouth every morning.   Yes [provider]  polycarbophil (FIBERCON) 625 MG tablet Take 625 mg by mouth 2 (two) times daily.   Yes [provider]  simvastatin (ZOCOR) 20 MG tablet Take 20 mg by mouth every morning.   Yes [provider]  topiramate (TOPAMAX) 25 MG tablet Take 25 mg by mouth daily. 25mg  at night for one week then 50 mg at night thereafter   Yes [provider]  venlafaxine XR (EFFEXOR-XR) 150 MG 24 hr capsule Take 150 mg by mouth 2 (two) times daily.   Yes [provider]         Asencion Noble, M.D. PGY1 Pager [phone] 03/06/2018 4:34 PM  Attending Note:  55 yea rold female with a history of liver cancer and cerebral aneurysm who presents to PCCM for vent management post-op.  Patient had the aneurysm coiled then developed bifrontal CVA and a groin hematoma and went to the OR for evacuation of the hematoma and repair of the vessel.   Heparin was reversed and patient was transfused 2 units pRBC. PCCM was consulted for vent and medical management.  On exam, lungs  are clear with a very large right groin hematoma with a drain in place.  I reviewed CXR myself, ETT is too low, will move out 2 cm.  Will maintain sedated and ventilated overnight.  I do not believe that extubation at this point when patient has to lay flat is safe.  Checked ABG and adjusted vent accordingly.  CXR and ABG for AM ordered.  Transfuse 1 unit PRBC and restart neo for BP support.  Will not start TF, will likely be able to extubate in AM.  PCCM will continue to follow.  The patient is critically ill with multiple organ systems failure and requires high complexity decision making for assessment and support, frequent evaluation and titration of therapies, application of advanced monitoring technologies and extensive interpretation of multiple databases.   Critical Care Time devoted to patient care services described in this note is  38  Minutes. This time reflects time of care of this signee Dr Jennet Maduro. This critical care time does not reflect procedure time, or teaching time or supervisory time of PA/NP/Med student/Med Resident etc but could involve care discussion time.  Rush Farmer, M.D. [facility] Pulmonary/Critical Care Medicine. Pager: [phone]. After hours pager: [phone].

## 2018-04-01 NOTE — Consult Note (Addendum)
NEURO HOSPITALIST  CONSULT   Requesting Physician: Dr. Estanislado Pandy    Chief Complaint: right  arm and left leg weakness s/p procedure  History obtained from:  Patient  / Chart  HPI:                                                                                                                                         Rebecca Cox is an 58 y.o. female  With PMH agoraphobia, liver cancer, CKD 3, CAD, cerebral aneurysm, sleep apnea (w/o CPAP) was in neuro IR for a [employer] aneurysm embolization, and code stroke was initiated post procedure for right arm and left leg weakness which was new.   Patient was completely normal at 0925 when anesthesia was induced. When she was awakened post procedure she had left leg weakness and right arm weakness.  IR was holding pressure on right groin site due to concern for expanding hematoma.  Patient was in IR for Cerebral arteriogram with anterior communicating artery aneurysm embolization. No prior stroke history.  Patient unable to provide reliable history.  Patient was given heparin during the procedure.  Vascular surgery was consulted stat for operative assessment of right groin hematoma.  Hospital course:  IR for Cerebral arteriogram with anterior communicating artery aneurysm embolization OR  To control bleeding from right groin. BP: 117/76 BG: 83 creatinine: 1.30  Date last known well: 04/03/2018 Time last known well: 0925 tPA Given: No: patient actively bleeding  Modified Rankin: Rankin Score=0 NIHSS:18  Past Medical History:  Diagnosis Date  . Agoraphobia   . Arthritis   . Cancer ([place])    liver  . Cerebral aneurysm   . Chronic kidney disease    stage 3  . Coronary artery disease   . Depression   . Diverticulitis   . Esophageal hernia   . Family history of adverse reaction to anesthesia    Pt suspects that " mother had cardiac arrest from anesthesia- not exactly sure"   . GERD (gastroesophageal  reflux disease)   . Headache   . Sleep apnea    does not wear CPAP  . Thyroid nodule   . Wears glasses   . Wears partial dentures     Past Surgical History:  Procedure Laterality Date  . BARIATRIC SURGERY    . CARPAL TUNNEL RELEASE    . COLONOSCOPY W/ BIOPSIES AND POLYPECTOMY    . MULTIPLE TOOTH EXTRACTIONS    . TONSILLECTOMY      History reviewed. No pertinent family history.       Social History:  reports that she has been smoking cigarettes. She has never used smokeless  tobacco. She reports previous alcohol use. She reports previous drug use.  Allergies: No Known Allergies  Medications:                                                                                                                           Scheduled: . aspirin EC  325 mg Oral 60 min Pre-Op  . clopidogrel  75 mg Oral 60 min Pre-Op  . niMODipine  0-60 mg Oral 60 min Pre-Op   Continuous: . sodium chloride    .  ceFAZolin (ANCEF) IV     PYP:PJKDTOIZ (SUBLIMAZE) injection, promethazine   ROS:                                                                                                                                        unobtainable from patient due to mental status and waxing and waning of exam.  General Examination:                                                                                                      Blood pressure 102/78, pulse 72, temperature 97.7 F (36.5 C), temperature source Oral, resp. rate 16, height 5\' 5"  (1.651 m), weight 95.3 kg, SpO2 97 %.  HEENT-  Normocephalic, no lesions, without obvious abnormality.  Normal external eye and conjunctiva. Cardiovascular- S1-S2 audible, pulses palpable throughout  Lungs-no rhonchi or wheezing noted, no excessive working breathing.  Saturations within normal limits on oxygen. Abdomen- All 4 quadrants palpated and non tender Extremities- Warm, dry and intact Musculoskeletal-no joint tenderness, deformity or swelling Skin-warm and dry,   Actively bleeding from right groin puncture. With palpable hematoma to right groin.  Neurological Examination Mental Status: Awake,Alert,.  No dysarthria noted, but patient was answering question inconsistently.   Naming, comprehension and repetition were all impaired and inconsistent Cranial Nerves: II:  Visual fields grossly normal to threat III,IV, VI: ptosis not present, extra-ocular motions intact bilaterally, pupils equal, round, reactive to  light and accommodation V,VII: smile symmetric, facial light touch sensation normal bilaterally VIII: hearing normal bilaterally IX,X: uvula rises midline XI: bilateral shoulder shrug XII: midline tongue extension Motor: Right upper extremity   1/5 (although she was able to push me down but she was unable to raise her arms up at all)  Left  Upper extremity   4/5 Right lower extremity   4/5    Left lower extremity   1/5 (increased tone, seem like she was able to recruit some of her hip flexor muscles but chest flicker, in the emergency duration difficult to elicit Hoover's)  Sensory:  light touch intact throughout, bilaterally Cerebellar: UTA Gait: deferred  Lab Results:  Basic Metabolic Panel: Recent Labs  Lab 04/05/2018 0653  NA 141  K 4.1  CL 111  CO2 21*  GLUCOSE 83  BUN 16  CREATININE 1.30*  CALCIUM 9.8   CBC: Recent Labs  Lab 03/09/2018 0653  WBC 7.3  NEUTROABS 4.9  HGB 12.8  HCT 42.9  MCV 91.7  PLT 212   Imaging: No results found.  Laurey Morale, MSN, NP-C [facility] [phone]  03/15/2018, 11:55 AM   Attending physician note to follow with Assessment and plan .  Attending addendum Patient seen and examined as an acute code stroke Agree with the history physical documented above. Inconsistent exam and possible right femoral puncture site hematoma with active expansion prevented TPA. Imaging was delayed because the patient needed to go to the OR emergently to control bleeding from the femoral  puncture site.  Assessment: 58 y.o. female   With PMH agoraphobia, liver cancer, CKD 3, CAD, cerebral aneurysm, sleep apnea (w/o cpap) was in neuro IR for a procedure and code stroke was initiated post procedure. Vascular surgery was consulted for right groin puncture site hematoma that was rapidly expanding. The decision was made to take the patient to the OR  to control bleeding prior to brain imaging-CT or MRI. Patient is not a candidate for TPA d/t active bleeding. MRI: pending   Impression Acute ischemic stroke-embolic versus watershed due to acute blood loss     Recommendations:  --MRI Brain when able --Continue antiplatelets per IR for the stent assisted coiling --Continue telemetry - evaluate for arrhythmias --Frequent neurochecks --Check A1c and lipid panel --PT OT speech therapy --CTA head and neck --I would usually allow for permissive hypertension if there is a concern for acute ischemic stroke but because of her aneurysm recently being coiled, I would leave the blood pressure parameters per endovascular. I would avoid hypotension and would want systolic blood pressures above 120.  --please page stroke NP  Or  PA  Or MD from 8am -4 pm  as this patient from this time will be  followed by the stroke.   You can look them up on [email or web address]  Password [email or web address]   -- Amie Portland, MD [facility] Pager: [phone] If 7pm to 7am, please call on call as listed on AMION.   CRITICAL CARE ATTESTATION Performed by: Amie Portland, MD Total critical care time: 40 minutes Critical care time was exclusive of separately billable procedures and treating other patients and/or supervising APPs/Residents/Students Critical care was necessary to treat or prevent imminent or life-threatening deterioration due to strokelike symptoms This patient is critically ill and at significant risk for neurological worsening and/or death and care requires constant monitoring. Critical care was time  spent personally by me on the following activities: development of treatment plan with patient and/or surrogate as well as nursing,  discussions with consultants, evaluation of patient's response to treatment, examination of patient, obtaining history from patient or surrogate, ordering and performing treatments and interventions, ordering and review of laboratory studies, ordering and review of radiographic studies, pulse oximetry, re-evaluation of patient's condition, participation in multidisciplinary rounds and medical decision making of high complexity in the care of this patient.

## 2018-04-01 NOTE — Code Documentation (Signed)
58 yo female in IR receiving routine procedure when she was noted to not move her RIGHT arm and LEFT leg upon waking from anesthesia. Groin Puncture site had not reached hemostasis and was still actively bleeding upon stroke team arrival. Initial NIHSS 18 due inability to move the RIGHT arm and LEFT leg along with weakness noted in her opposing arm and leg. Pt was not able to follow commands or answer questions appropriately. She did NOT have dysarthria, facial droop, or visual changes. Sensory was intact and patient was awake and alert. Pt is not a tPA candidate due to patient being given Heparin during procedure, and patient just had stent placed during procedure. Pt was actively bleeding with large hematoma noted to her right groin. Vascular Surgery called and assessed the patient. Stated she need emergency vascular surgery to address bleeding and current blood pressure noted to be 120s by Art Line and 90s by cuff. MRI ordered, but pt going straight to OR to stabilize bleeding. IR RN to give report to OR. Stroke Team to reassess after OR.

## 2018-04-01 NOTE — Transfer of Care (Signed)
Immediate Anesthesia Transfer of Care Note  Patient: Rebecca Cox  Procedure(s) Performed: EMBOLIZATION (N/A )  Patient Location: NICU  Anesthesia Type:General  Level of Consciousness: Patient remains intubated per anesthesia plan  Airway & Oxygen Therapy: Patient remains intubated per anesthesia plan and Patient placed on Ventilator (see vital sign flow sheet for setting)  Post-op Assessment: Report given to RN and Post -op Vital signs reviewed and stable  Post vital signs: stable  Last Vitals:  Vitals Value Taken Time  BP    Temp    Pulse 81 03/27/2018  3:58 PM  Resp 16 03/18/2018  3:58 PM  SpO2 100 % 04/03/2018  3:58 PM  Vitals shown include unvalidated device data.  Last Pain:  Vitals:   04/03/2018 0658  TempSrc:   PainSc: 0-No pain      Patients Stated Pain Goal: 7 (37/48/27 0786)  Complications: No apparent anesthesia complications

## 2018-04-01 NOTE — Progress Notes (Signed)
Patient ID: Rebecca Cox, female   DOB: Mar 26, 1960, 58 y.o.   MRN: [id number] INR. Appreciate neurology,vascular surgery and CCM  Consultations.  Patient is post repair of deep femoral artery  wth external drain to decompress the groin hematoma. Has received 3 units of blood.Awaiting repeat CBC check. Presently on propofol drip.  VS BO 120s.70s on vasopressors. RT groin bruising with externa drain. Distal pulses dopplerable.  Reviewed patients MRI scan with husband.informed him of changes consistent with watershed ischemia possibly due to microemboli.No ICH noted. ACAsand MCA s bilaterally patent. Informed him of management plans per CCM. Will D/c plavix and start on brilinta 90 mg BID starting tonight. Also change aspirin ti 81 mg per day. Repeat P2 Y12 in AM S.Tulani Kidney MD

## 2018-04-02 ENCOUNTER — Inpatient Hospital Stay (HOSPITAL_COMMUNITY): Payer: Medicare Other

## 2018-04-02 ENCOUNTER — Encounter (HOSPITAL_COMMUNITY): Payer: Self-pay | Admitting: Vascular Surgery

## 2018-04-02 ENCOUNTER — Encounter (HOSPITAL_COMMUNITY): Payer: Self-pay | Admitting: Certified Registered"

## 2018-04-02 DIAGNOSIS — I9589 Other hypotension: Secondary | ICD-10-CM

## 2018-04-02 DIAGNOSIS — S75001A Unspecified injury of femoral artery, right leg, initial encounter: Secondary | ICD-10-CM

## 2018-04-02 DIAGNOSIS — E785 Hyperlipidemia, unspecified: Secondary | ICD-10-CM

## 2018-04-02 DIAGNOSIS — I671 Cerebral aneurysm, nonruptured: Principal | ICD-10-CM

## 2018-04-02 DIAGNOSIS — I63423 Cerebral infarction due to embolism of bilateral anterior cerebral arteries: Secondary | ICD-10-CM

## 2018-04-02 DIAGNOSIS — I6340 Cerebral infarction due to embolism of unspecified cerebral artery: Secondary | ICD-10-CM

## 2018-04-02 LAB — CBC WITH DIFFERENTIAL/PLATELET
Abs Immature Granulocytes: 0.09 10*3/uL — ABNORMAL HIGH (ref 0.00–0.07)
Basophils Absolute: 0 10*3/uL (ref 0.0–0.1)
Basophils Relative: 0 %
EOS ABS: 0 10*3/uL (ref 0.0–0.5)
Eosinophils Relative: 0 %
HCT: 34.7 % — ABNORMAL LOW (ref 36.0–46.0)
Hemoglobin: 11.6 g/dL — ABNORMAL LOW (ref 12.0–15.0)
Immature Granulocytes: 1 %
Lymphocytes Relative: 9 %
Lymphs Abs: 1.4 10*3/uL (ref 0.7–4.0)
MCH: 29.1 pg (ref 26.0–34.0)
MCHC: 33.4 g/dL (ref 30.0–36.0)
MCV: 87 fL (ref 80.0–100.0)
MONOS PCT: 7 %
Monocytes Absolute: 1 10*3/uL (ref 0.1–1.0)
NEUTROS PCT: 83 %
Neutro Abs: 13.1 10*3/uL — ABNORMAL HIGH (ref 1.7–7.7)
Platelets: 152 10*3/uL (ref 150–400)
RBC: 3.99 MIL/uL (ref 3.87–5.11)
RDW: 14.9 % (ref 11.5–15.5)
WBC: 15.6 10*3/uL — ABNORMAL HIGH (ref 4.0–10.5)
nRBC: 0 % (ref 0.0–0.2)

## 2018-04-02 LAB — BLOOD GAS, ARTERIAL
Acid-base deficit: 6.1 mmol/L — ABNORMAL HIGH (ref 0.0–2.0)
Bicarbonate: 18.9 mmol/L — ABNORMAL LOW (ref 20.0–28.0)
DRAWN BY: 27407
FIO2: 40
MECHVT: 450 mL
O2 Saturation: 98.6 %
PATIENT TEMPERATURE: 98.6
PEEP: 5 cmH2O
RATE: 16 resp/min
pCO2 arterial: 37.9 mmHg (ref 32.0–48.0)
pH, Arterial: 7.318 — ABNORMAL LOW (ref 7.350–7.450)
pO2, Arterial: 166 mmHg — ABNORMAL HIGH (ref 83.0–108.0)

## 2018-04-02 LAB — POCT I-STAT 7, (LYTES, BLD GAS, ICA,H+H)
Acid-base deficit: 8 mmol/L — ABNORMAL HIGH (ref 0.0–2.0)
Bicarbonate: 18.6 mmol/L — ABNORMAL LOW (ref 20.0–28.0)
Calcium, Ion: 1.09 mmol/L — ABNORMAL LOW (ref 1.15–1.40)
HCT: 21 % — ABNORMAL LOW (ref 36.0–46.0)
Hemoglobin: 7.1 g/dL — ABNORMAL LOW (ref 12.0–15.0)
O2 SAT: 100 %
Patient temperature: 34.8
Potassium: 4.5 mmol/L (ref 3.5–5.1)
Sodium: 142 mmol/L (ref 135–145)
TCO2: 20 mmol/L — AB (ref 22–32)
pCO2 arterial: 36.3 mmHg (ref 32.0–48.0)
pH, Arterial: 7.305 — ABNORMAL LOW (ref 7.350–7.450)
pO2, Arterial: 305 mmHg — ABNORMAL HIGH (ref 83.0–108.0)

## 2018-04-02 LAB — TYPE AND SCREEN
ABO/RH(D): A POS
Antibody Screen: NEGATIVE
Unit division: 0
Unit division: 0
Unit division: 0
Unit division: 0

## 2018-04-02 LAB — BASIC METABOLIC PANEL
Anion gap: 6 (ref 5–15)
BUN: 15 mg/dL (ref 6–20)
CO2: 18 mmol/L — ABNORMAL LOW (ref 22–32)
CREATININE: 1.05 mg/dL — AB (ref 0.44–1.00)
Calcium: 8.9 mg/dL (ref 8.9–10.3)
Chloride: 115 mmol/L — ABNORMAL HIGH (ref 98–111)
GFR calc Af Amer: >60 mL/min (ref >60)
GFR calc non Af Amer: 60 mL/min — ABNORMAL LOW (ref >60)
Glucose, Bld: 127 mg/dL — ABNORMAL HIGH (ref 70–99)
Potassium: 4.6 mmol/L (ref 3.5–5.1)
Sodium: 139 mmol/L (ref 135–145)

## 2018-04-02 LAB — BPAM RBC
BLOOD PRODUCT EXPIRATION DATE: 202002132359
Blood Product Expiration Date: 202002042359
Blood Product Expiration Date: 202002042359
Blood Product Expiration Date: 202002132359
ISSUE DATE / TIME: 202001271225
ISSUE DATE / TIME: 202001271225
ISSUE DATE / TIME: 202001271225
ISSUE DATE / TIME: 202001271225
UNIT TYPE AND RH: 5100
Unit Type and Rh: 5100
Unit Type and Rh: 5100
Unit Type and Rh: 5100

## 2018-04-02 LAB — MAGNESIUM
Magnesium: 1.6 mg/dL — ABNORMAL LOW (ref 1.7–2.4)
Magnesium: 1.4 mg/dL — ABNORMAL LOW (ref 1.7–2.4)
Magnesium: 1.5 mg/dL — ABNORMAL LOW (ref 1.7–2.4)

## 2018-04-02 LAB — CK: Total CK: 103 U/L (ref 38–234)

## 2018-04-02 LAB — PHOSPHORUS
Phosphorus: 4.1 mg/dL (ref 2.5–4.6)
Phosphorus: 3.8 mg/dL (ref 2.5–4.6)
Phosphorus: 3.9 mg/dL (ref 2.5–4.6)

## 2018-04-02 LAB — PLATELET INHIBITION P2Y12: Platelet Function  P2Y12: 218 [PRU] (ref 194–418)

## 2018-04-02 LAB — GLUCOSE, CAPILLARY
GLUCOSE-CAPILLARY: 124 mg/dL — AB (ref 70–99)
Glucose-Capillary: 97 mg/dL (ref 70–99)
Glucose-Capillary: 108 mg/dL — ABNORMAL HIGH (ref 70–99)

## 2018-04-02 MED ORDER — VITAL HIGH PROTEIN PO LIQD
1000.0000 mL | ORAL | Status: DC
  Administered 2018-04-02: 1000 mL

## 2018-04-02 MED ORDER — TICAGRELOR 90 MG PO TABS: 90.0000 mg | ORAL_TABLET | Freq: Once | ORAL | Status: DC

## 2018-04-02 MED ORDER — CLONAZEPAM 0.5 MG PO TABS
0.5000 mg | ORAL_TABLET | Freq: Two times a day (BID) | ORAL | Status: DC
  Administered 2018-04-02 – 2018-04-10 (×17): 0.5 mg
  Filled 2018-04-02 (×17): qty 1

## 2018-04-02 MED ORDER — SIMVASTATIN 20 MG PO TABS
40.0000 mg | ORAL_TABLET | Freq: Every morning | ORAL | Status: DC
  Administered 2018-04-02 – 2018-04-10 (×9): 40 mg
  Filled 2018-04-02 (×9): qty 2

## 2018-04-02 MED ORDER — PRO-STAT SUGAR FREE PO LIQD
30.0000 mL | Freq: Two times a day (BID) | ORAL | Status: DC
  Administered 2018-04-02 – 2018-04-10 (×17): 30 mL
  Filled 2018-04-02 (×16): qty 30

## 2018-04-02 MED ORDER — VENLAFAXINE HCL ER 150 MG PO CP24
150.0000 mg | ORAL_CAPSULE | Freq: Two times a day (BID) | ORAL | Status: DC
  Filled 2018-04-02: qty 1

## 2018-04-02 MED ORDER — BUPROPION HCL ER (SR) 150 MG PO TB12: 150.0000 mg | ORAL_TABLET | Freq: Two times a day (BID) | ORAL | Status: DC

## 2018-04-02 MED ORDER — BUPROPION HCL 100 MG PO TABS
100.0000 mg | ORAL_TABLET | Freq: Three times a day (TID) | ORAL | Status: DC
  Administered 2018-04-02 – 2018-04-10 (×25): 100 mg
  Filled 2018-04-02 (×27): qty 1

## 2018-04-02 MED ORDER — HEPARIN SODIUM (PORCINE) 5000 UNIT/ML IJ SOLN
5000.0000 [IU] | Freq: Three times a day (TID) | INTRAMUSCULAR | Status: DC
  Administered 2018-04-02 – 2018-04-10 (×25): 5000 [IU] via SUBCUTANEOUS
  Filled 2018-04-02 (×26): qty 1

## 2018-04-02 MED ORDER — VENLAFAXINE HCL 50 MG PO TABS
100.0000 mg | ORAL_TABLET | Freq: Three times a day (TID) | ORAL | Status: DC
  Administered 2018-04-02 – 2018-04-07 (×15): 100 mg
  Filled 2018-04-02 (×17): qty 2

## 2018-04-02 MED ORDER — SIMVASTATIN 20 MG PO TABS: 20.0000 mg | ORAL_TABLET | Freq: Every morning | ORAL | Status: DC

## 2018-04-02 MED ORDER — TOPIRAMATE 25 MG PO TABS
50.0000 mg | ORAL_TABLET | Freq: Every day | ORAL | Status: DC
  Administered 2018-04-02 – 2018-04-09 (×8): 50 mg
  Filled 2018-04-02 (×8): qty 2

## 2018-04-02 MED ORDER — TOPIRAMATE 25 MG PO TABS: 25.0000 mg | ORAL_TABLET | Freq: Every day | ORAL | Status: DC

## 2018-04-02 MED ORDER — MIDAZOLAM HCL 2 MG/2ML IJ SOLN
INTRAMUSCULAR | Status: AC
  Administered 2018-04-02: 1 mg
  Filled 2018-04-02: qty 2

## 2018-04-02 NOTE — Progress Notes (Signed)
EEG completed, results pending. 

## 2018-04-02 NOTE — Progress Notes (Signed)
Went to see/evaluate patient with Dr. Estanislado Pandy in neuro ICU.  Patient laying in bed intubated and sedated. Claiborne Billings, RN reports she just gave patient Versed due to agitation.  On PE, patient more relaxed than earlier this AM (due to Versed). PERRL bilaterally. Right groin incision soft without active bleeding or hematoma.  Will order SCDs. IR to follow.  Bea Graff Bethannie Iglehart, PA-C 04/02/2018, 3:30 PM

## 2018-04-02 NOTE — Progress Notes (Signed)
Patient transported to CT. No complications. Vitals stable throughout. RT will continue to monitor.

## 2018-04-02 NOTE — Progress Notes (Addendum)
NAME:  Rebecca Cox, MRN:  [id number], DOB:  06/01/1960, LOS: 1 ADMISSION DATE:  03/07/2018, CONSULTATION DATE: 03/08/2018 REFERRING MD:  Patrecia Pour, CHIEF COMPLAINT: Acute respiratory failure  Brief History   This is a 58 year old female with history of liver cancer and cerebral aneurysm who presents to PCCM for vent management postop.  Patient have the aneurysm coiled in during the procedure developed a bifrontal CVA and a right groin hematoma.  She returned to the OR for evacuation of the hematoma and repair of the vessel.  Heparin was reversed and patient was transfused 2 units of packed red blood cells.  Past Medical History   Past Medical History:  Diagnosis Date  . Agoraphobia   . Arthritis   . Cancer ([place])    liver  . Cerebral aneurysm   . Chronic kidney disease    stage 3  . Coronary artery disease   . Depression   . Diverticulitis   . Esophageal hernia   . Family history of adverse reaction to anesthesia    Pt suspects that " mother had cardiac arrest from anesthesia- not exactly sure"   . GERD (gastroesophageal reflux disease)   . Headache   . Sleep apnea    does not wear CPAP  . Thyroid nodule   . Wears glasses   . Wears partial dentures    Significant Hospital Events   1/27 > anterior communicating aneurysm embolization, post procedure code stroke, right femoral hematoma  Consults:  PCCM Neurology  Procedures:  1/27 >  anterior communicating aneurysm embolization 1/27 > ETT 1/27 R aline >> 1/28  Significant Diagnostic Tests:  MR I 03/23/2018 >>  Bilateral watershed distribution cortical and subcortical edema and restricted diffusion, more extensive on the left than the right.  Differential diagnosis is true watershed infarction versus vaso regulatory disturbance secondary to arterial spasm, which is demonstrated in the anterior cerebral vessels by MR angiography. 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  1/28 CTH >> 1.  Expected progression of bilateral watershed territory infarcts, left greater than right. 2. No new infarct territory or hemorrhage. 3. ACA stent.  Micro Data:  1/27 MRSA PCR >>neg  Antimicrobials:   1/27 cefazolin preop  Interim history/subjective:  Remains on peripheral Neo at 110 mcg/min for SBP goal 120-140 Continues on fentanyl 200 mcg/min, attempts at decreasing sedation- patient chews on ETT RN reports new extremity stiffness and hand clenching Returned from [facility] this am- showing expected progression of bilateral watershed infarcts Hgb stable, remains afebrile Aline not working- d/c'd  Objective   Blood pressure 121/77, pulse 94, temperature 98.7 F (37.1 C), temperature source Axillary, resp. rate 16, height 5\' 5"  (1.651 m), weight 95.3 kg, SpO2 100 %.    Vent Mode: PRVC FiO2 (%):  [40 %] 40 % Set Rate:  [16 bmp] 16 bmp Vt Set:  [450 mL] 450 mL PEEP:  [5 cmH20] 5 cmH20 Plateau Pressure:  [12 cmH20-13 cmH20] 13 cmH20   Intake/Output Summary (Last 24 hours) at 04/02/2018 1021 Last data filed at 04/02/2018 6599 Gross per 24 hour  Intake 5891.96 ml  Output 2110 ml  Net 3781.96 ml   Filed Weights   03/27/2018 0621  Weight: 95.3 kg    Examination: General:  Critically ill female on MV  HEENT: MM pink/moist, ETT, OGT, pupils 3/reactive, no nystagmus, anicteric   Neuro: opens eyes and intermittently nods, increased muscle tone in neck and in all extremites, fingers are contracted, LUE is flexed, and feet turned  in  CV: RR, distant, no murmur, right groin with wound vac in place w/ extensive ecchymosis (area marked) w/JP- RLE is warm, palpable PT pulse PULM: even/non-labored, lungs bilaterally clear GI: obese, soft, non-tender, bs active, foley  Extremities: warm (feet are cool) /dry, no edema Skin: no rashes   Resolved Hospital Problem list    Assessment & Plan:  ACOM aneurysm s/p embolization w/stent complicated by intraoperative bilateral  ACA strokes, Left worse  than right, watershed ACA/ MCA infarct and right groin hematoma  - new neck and muscle tone 1/28 P:  Per Neurology  Brillinta and ASA per Dr. Estanislado Pandy  Ongoing neuro exams EEG to rule out seizure Home meds reviewed for possible withdrawal or SE- wellbutrin, effexor, klonopin, and topamax,- these were resumed by Neurology.  Neurology thinks is possibly related to bilateral ACA and MCA infarcts Check CK Ongoing SBP goal 120-140, currently on peripheral Neo at 110, if greater than 200 mcg/hr will need central access D/c aline as it is nonfunctional, use cuff pressures for now Further imaging per Neurology   Acute respiratory insufficiency post operatively  - CXR 1/28 stable lines, no acute process P:  Continue full MV support, PRVC 8 cc/kg Does not meet SBT criteria at this time, reassess in am   VAP measures  PPI  PAD protocol w/ fentanyl  Intermittent CXR  Right groin hematoma s/p repair  P:  Per Vascular surgery  Mini wound vac in place w/ JP Monitor site, ecchymosis, and JP drainage, Hgb levels (stable)  Anemia secondary to right groin hematoma  - s/p 2 units PRBC 1/27 P:  Hgb stable Trend CBC Transfuse for hgb <7  Hypomagnesia  P:  Mag 2 gm now   DM P:  Start CBG q 4, add SSI if > 180  Best practice:  Diet: start TF  Pain/Anxiety/Delirium protocol (if indicated): Fentanyl VAP protocol (if indicated): Yes DVT prophylaxis:  SCDs GI prophylaxis: None Glucose control: Monitor Mobility: Bedrest Code Status: Full  Family Communication: Husband updated at bedside 1/28 Disposition:  ICU  Labs   CBC:  Recent Labs  Lab 03/28/2018 0653  03/23/2018 1214 03/06/2018 1320 03/24/2018 1608 03/08/2018 2022 04/02/18 0449  WBC 7.3  --   --   --  8.3 16.1* 15.6*  NEUTROABS 4.9  --   --   --   --   --  13.1*  HGB 12.8   < > 6.1* 7.1* 11.2* 12.5 11.6*  HCT 42.9   < > 18.0* 21.0* 33.6* 38.4 34.7*  MCV 91.7  --   --   --  88.0 86.7 87.0  PLT 212  --   --   --  126* 159 152   <  > = values in this interval not displayed.    Basic Metabolic Panel: Recent Labs  Lab 03/23/2018 0653 03/20/2018 1141 03/16/2018 1214 03/12/2018 1320 03/17/2018 1608 04/02/18 0449  NA 141 143 144 142 136 139  K 4.1 4.2 3.8 4.5 4.4 4.6  CL 111  --   --   --  111 115*  CO2 21*  --   --   --  17* 18*  GLUCOSE 83 125*  --   --  165* 127*  BUN 16  --   --   --  13 15  CREATININE 1.30*  --   --   --  1.04* 1.05*  CALCIUM 9.8  --   --   --  9.3 8.9  MG  --   --   --   --   --  1.6*  PHOS  --   --   --   --   --  4.1   GFR: Estimated Creatinine Clearance: 69.1 mL/min (A) (by C-G formula based on SCr of 1.05 mg/dL (H)). Recent Labs  Lab 03/27/2018 0653 03/15/2018 1608 03/24/2018 2022 04/02/18 0449  WBC 7.3 8.3 16.1* 15.6*    Liver Function Tests: No results for input(s): AST, ALT, ALKPHOS, BILITOT, PROT, ALBUMIN in the last 168 hours. No results for input(s): LIPASE, AMYLASE in the last 168 hours. No results for input(s): AMMONIA in the last 168 hours.  ABG    Component Value Date/Time   PHART 7.318 (L) 04/02/2018 0413   PCO2ART 37.9 04/02/2018 0413   PO2ART 166 (H) 04/02/2018 0413   HCO3 18.9 (L) 04/02/2018 0413   TCO2 20 (L) 04/05/2018 1320   ACIDBASEDEF 6.1 (H) 04/02/2018 0413   O2SAT 98.6 04/02/2018 0413     Coagulation Profile: Recent Labs  Lab 03/11/2018 0653 03/30/2018 1608  INR 1.03 1.24    Cardiac Enzymes: No results for input(s): CKTOTAL, CKMB, CKMBINDEX, TROPONINI in the last 168 hours.  HbA1C: Hgb A1c MFr Bld  Date/Time Value Ref Range Status  03/11/2018 06:22 AM 4.7 (L) 4.8 - 5.6 % Final    Comment:    (NOTE) Pre diabetes:          5.7%-6.4% Diabetes:              >6.4% Glycemic control for   <7.0% adults with diabetes     CBG: Recent Labs  Lab 03/30/2018 1938 04/03/2018 2307  GLUCAP 175* 144*    CCT 35 mins  Kennieth Rad, MSN, AGACNP-BC [facility] Pgr: [phone] or if no answer [phone] 04/02/2018, 11:22 AM

## 2018-04-02 NOTE — Progress Notes (Signed)
Referring Physician(s): Merlene Laughter, [place]  Supervising Physician: Luanne Bras  Patient Status:  [facility] - In-pt  Chief Complaint: None  Subjective:  ACOM aneurysm s/p embolization using an ATLAS neuroform stent 03/22/2018 by Dr. Estanislado Pandy. Patient laying in bed intubated without sedation. She demonstrates postural contraction in all 4 extremities- no spontaneous movements of RUE but she can slightly wiggle bilateral toes and left fingers She opens eyes to voice but only able to follow some commands (can slightly wiggle bilateral toes and left fingers). Right groin incision c/d/i.  RN reports new onset of postural contraction this AM and CT head was ordered. CT head 04/02/2018: 1. Expected progression of bilateral watershed territory infarcts, left greater than right. 2. No new infarct territory or hemorrhage. 3. ACA stent.   Allergies: Patient has no known allergies.  Medications: Prior to Admission medications   Medication Sig Start Date End Date Taking? Authorizing Provider  aspirin 325 MG EC tablet Take 325 mg by mouth daily. " And as needed if headache continues"   Yes [provider]  B-COMPLEX-C PO Take 1 tablet by mouth daily.   Yes [provider]  buPROPion (WELLBUTRIN SR) 150 MG 12 hr tablet Take 150 mg by mouth 2 (two) times daily.   Yes [provider]  celecoxib (CELEBREX) 200 MG capsule Take 200 mg by mouth daily. 5pm   Yes [provider]  clonazePAM (KLONOPIN) 0.5 MG tablet Take 0.5 mg by mouth 2 (two) times daily. 1 tablet in the morning and take 1 tablet at 5p. May take additional half-whole tablet throughout the day as needed for anxiety   Yes [provider]  clopidogrel (PLAVIX) 75 MG tablet Take 75 mg by mouth daily.   Yes [provider]  metoprolol succinate (TOPROL-XL) 25 MG 24 hr tablet Take 12.5 mg by mouth every morning.   Yes [provider]  pantoprazole (PROTONIX) 40 MG tablet Take 40  mg by mouth every morning.   Yes [provider]  polycarbophil (FIBERCON) 625 MG tablet Take 625 mg by mouth 2 (two) times daily.   Yes [provider]  simvastatin (ZOCOR) 20 MG tablet Take 20 mg by mouth every morning.   Yes [provider]  topiramate (TOPAMAX) 25 MG tablet Take 25 mg by mouth daily. 25mg  at night for one week then 50 mg at night thereafter   Yes [provider]  venlafaxine XR (EFFEXOR-XR) 150 MG 24 hr capsule Take 150 mg by mouth 2 (two) times daily.   Yes [provider]     Vital Signs: BP 121/77   Pulse 94   Temp 98.2 F (36.8 C) (Axillary)   Resp 16   Ht 5\' 5"  (1.651 m)   Wt 210 lb (95.3 kg)   SpO2 100%   BMI 34.95 kg/m   Physical Exam Vitals signs and nursing note reviewed.  Constitutional:      General: She is not in acute distress.    Appearance: Normal appearance.     Comments: Intubated.  Pulmonary:     Effort: Pulmonary effort is normal. No respiratory distress.     Comments: Intubated. Skin:    Comments: Right groin incision soft without active bleeding or hematoma.  Neurological:     Mental Status: She is alert.     Comments: Intubated without sedation, she opens eyes to voice but only able to follow some commands (can slightly wiggle bilateral toes and left fingers). Speech not assessed due to ET  tube. PERRL bilaterally. EOMs not assessed. Visual fields not assessed. Unable to assess facial asymmetry due to ET tube. Unable to assess tongue protrusion due to ET tube. She demonstrates postural contraction in all 4 extremities- no spontaneous movements of RUE but she can slightly wiggle bilateral toes and left fingers. Pronator drift not assessed. Fine motor and coordination not assessed. Gait not assessed. Romberg not assessed. Heel to toe not assessed. Distal pulses palpable bilaterally with Doppler.  Psychiatric:     Comments: Intubated.     Imaging: Mr Virgel Paling NI Contrast  Result  Date: 03/06/2018 CLINICAL DATA:  Follow-up intervention for anterior communicating artery aneurysm with stent. Acute onset of right arm and leg weakness. EXAM: MRI HEAD WITHOUT CONTRAST MRA HEAD WITHOUT CONTRAST TECHNIQUE: Multiplanar, multiecho pulse sequences of the brain and surrounding structures were obtained without intravenous contrast. Angiographic images of the head were obtained using MRA technique without contrast. COMPARISON:  Angiographic images same day.  MRI 03/12/2018. FINDINGS: MRI HEAD FINDINGS Brain: The brain shows bilateral watershed distribution cortical and subcortical edema and restricted diffusion, affecting both hemispheres but more extensive on the left than the right. The question is if this represents true bilateral watershed infarction or if it represents a vaso regulatory disturbance related to spasm. No specific vascular territory infarction is seen. No sign of hemorrhage. Chronic small-vessel ischemic changes affect the brainstem and the cerebral hemispheric white matter as seen previously. No hydrocephalus. No extra-axial collection. Vascular: Major vessels at the base of the brain show flow. 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 There does appear to be a spasm appearance of the A1 vessel on the left and both anterior cerebral arteries, more extensively the right than the left. 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 No middle cerebral artery abnormality is seen. Posterior circulation vessels continued to show a normal appearance. IMPRESSION: Bilateral watershed distribution cortical and subcortical edema and restricted diffusion, more extensive on the left than the right. Differential diagnosis is true watershed infarction versus vaso regulatory disturbance secondary to arterial spasm, which is  demonstrated in the anterior cerebral vessels by MR angiography. 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 Electronically Signed   By: Nelson Chimes M.D.   On: 03/18/2018 16:02   Mr Brain Wo Contrast  Result Date: 03/31/2018 CLINICAL DATA:  Follow-up intervention for anterior communicating artery aneurysm with stent. Acute onset of right arm and leg weakness. EXAM: MRI HEAD WITHOUT CONTRAST MRA HEAD WITHOUT CONTRAST TECHNIQUE: Multiplanar, multiecho pulse sequences of the brain and surrounding structures were obtained without intravenous contrast. Angiographic images of the head were obtained using MRA technique without contrast. COMPARISON:  Angiographic images same day.  MRI 03/12/2018. FINDINGS: MRI HEAD FINDINGS Brain: The brain shows bilateral watershed distribution cortical and subcortical edema and restricted diffusion, affecting both hemispheres but more extensive on the left than the right. The question is if this represents true bilateral watershed infarction or if it represents a vaso regulatory disturbance related to spasm. No specific vascular territory infarction is seen. No sign of hemorrhage. Chronic small-vessel ischemic changes affect the brainstem and the cerebral hemispheric white matter as seen previously. No hydrocephalus. No extra-axial collection. Vascular: Major vessels at the base of the brain show flow. Skull and upper cervical spine: Negative Sinuses/Orbits: Clear/normal Other: None MRA HEAD FINDINGS MR angiography read demonstrates the 5  mm anterior communicating artery aneurysm. There does appear to be a spasm appearance of the A1 vessel on the left and both anterior cerebral arteries, more extensively the right than the left. 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 No middle cerebral artery abnormality is seen. Posterior circulation vessels continued to show a normal appearance. IMPRESSION: Bilateral watershed  distribution cortical and subcortical edema and restricted diffusion, more extensive on the left than the right. Differential diagnosis is true watershed infarction versus vaso regulatory disturbance secondary to arterial spasm, which is demonstrated in the anterior cerebral vessels by MR angiography. 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 Electronically Signed   By: Nelson Chimes M.D.   On: 03/20/2018 16:02   Dg Chest Port 1 View  Result Date: 04/02/2018 CLINICAL DATA:  Endotracheal tube. Dizziness. Weakness. Liver cancer. EXAM: PORTABLE CHEST 1 VIEW COMPARISON:  03/15/2018 FINDINGS: Stable endotracheal tube. NG tube placed. Tip is beyond the gastroesophageal junction. Normal heart size. Clear lungs. No pneumothorax. IMPRESSION: NG tube placed beyond the GE junction. Lungs remain clear. Electronically Signed   By: Marybelle Killings M.D.   On: 04/02/2018 07:58   Dg Chest Port 1 View  Result Date: 03/13/2018 CLINICAL DATA:  Status post intubation. EXAM: PORTABLE CHEST 1 VIEW COMPARISON:  None. FINDINGS: ET tube is in place with the tip approximately 1.7 cm above the carina. Lungs are clear. No pneumothorax or pleural effusion. Heart size is normal. No acute or focal bony abnormality. IMPRESSION: ETT tip is 1.7 cm above the carina. Lungs are clear. Electronically Signed   By: Inge Rise M.D.   On: 04/03/2018 16:08   Dg Abd Portable 1v  Result Date: 04/03/2018 CLINICAL DATA:  Feeding tube placement. EXAM: PORTABLE ABDOMEN - 1 VIEW COMPARISON:  None. FINDINGS: Enteric tube tip in the gastric antrum, likely near the pylorus. Nonobstructive bowel gas pattern. Visualized lungs are clear. IMPRESSION: Enteric tube tip in the distal stomach, likely near the pylorus. Electronically Signed   By: Titus Dubin M.D.   On: 03/12/2018 22:25    Labs:  CBC: Recent Labs    03/19/2018 0653  03/19/2018 1320 03/26/2018 1608 03/21/2018 2022 04/02/18 0449  WBC 7.3  --   --  8.3 16.1*  15.6*  HGB 12.8   < > 7.1* 11.2* 12.5 11.6*  HCT 42.9   < > 21.0* 33.6* 38.4 34.7*  PLT 212  --   --  126* 159 152   < > = values in this interval not displayed.    COAGS: Recent Labs    03/10/18 1058 03/24/2018 0653 04/02/2018 1608  INR 0.97 1.03 1.24  APTT 25  --  26    BMP: Recent Labs    03/11/18 2024 03/17/2018 0653 03/20/2018 1141 03/13/2018 1214 03/07/2018 1320 03/28/2018 1608 04/02/18 0449  NA 139 141 143 144 142 136 139  K 4.1 4.1 4.2 3.8 4.5 4.4 4.6  CL 113* 111  --   --   --  111 115*  CO2 23 21*  --   --   --  17* 18*  GLUCOSE 77 83 125*  --   --  165* 127*  BUN 22* 16  --   --   --  13 15  CALCIUM 8.9 9.8  --   --   --  9.3 8.9  CREATININE 1.03* 1.30*  --   --   --  1.04* 1.05*  GFRNONAA >60 46*  --   --   --  >  60 60*  GFRAA >60 53*  --   --   --  >60 >60    LIVER FUNCTION TESTS: Recent Labs    03/10/18 1058 03/11/18 2024  BILITOT 0.5 0.3  AST 18 16  ALT 17 14  ALKPHOS 101 80  PROT 6.9 5.7*  ALBUMIN 3.8 3.2*    Assessment and Plan:  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 Estanislado Pandy. Patient's condition stable- remains intubated without sedation, opens eyes to voice and can follow some simple commands (can slightly wiggle bilateral toes and left fingers but no spontaneous movements of RUE). Right groin incision stable. Continue taking Brilinta 90 mg twice daily and Aspirin 81 mg once daily. Appreciate and agree with neurology and CCM management. IR to follow.   Electronically Signed: Earley Abide, PA-C 04/02/2018, 9:50 AM   I spent a total of 25 Minutes at the the patient's bedside AND on the patient's hospital floor or unit, greater than 50% of which was counseling/coordinating care for [facility] aneurysm s/p embolization.

## 2018-04-02 NOTE — Progress Notes (Signed)
   VASCULAR SURGERY ASSESSMENT & PLAN:   1 Day Post-Op s/p: Exploration of right groin for bleeding and repair of right deep femoral artery  The Praveena dressing will stay on for 7 days.  The JP drain 260 cc for the last 2 shifts, only 60 cc in the last shift.  I think this should stay in another day.   SUBJECTIVE:   She is sedated on the vent.  PHYSICAL EXAM:   Vitals:   04/02/18 0758 04/02/18 0800 04/02/18 1127 04/02/18 1200  BP:   135/67   Pulse: 94  99   Resp: 16  17   Temp:  98.7 F (37.1 C)  99.1 F (37.3 C)  TempSrc:  Axillary  Axillary  SpO2:   100%   Weight:      Height:       Her Praveena dressing has a good seal. Good Doppler signals right foot.  LABS:   Lab Results  Component Value Date   WBC 15.6 (H) 04/02/2018   HGB 11.6 (L) 04/02/2018   HCT 34.7 (L) 04/02/2018   MCV 87.0 04/02/2018   PLT 152 04/02/2018   Lab Results  Component Value Date   CREATININE 1.05 (H) 04/02/2018   Lab Results  Component Value Date   INR 1.24 03/16/2018   CBG (last 3)  Recent Labs    04/02/18 0321 04/02/18 0741 04/02/18 1120  GLUCAP 124* 97 108*    PROBLEM LIST:    Active Problems:   Brain aneurysm   Cerebral embolism with cerebral infarction   CURRENT MEDS:   . aspirin  81 mg Oral Daily  . chlorhexidine gluconate (MEDLINE KIT)  15 mL Mouth Rinse BID  . fentaNYL (SUBLIMAZE) injection  50 mcg Intravenous Once  . mouth rinse  15 mL Mouth Rinse 10 times per day  . pantoprazole (PROTONIX) IV  40 mg Intravenous Q24H  . ticagrelor  90 mg Per Tube BID  . ticagrelor  90 mg Per Tube Once    [name] Beeper: [phone] Office: [phone] 04/02/2018

## 2018-04-02 NOTE — Progress Notes (Signed)
Pt becoming significantly more agitated, biting at ETT tube and asynchrnous with the vent. NP Kennieth Rad requested to come to bedside. 1mg  versed was ordered verbally at the bedside by NP Kennieth Rad. See orders in [facility]

## 2018-04-02 NOTE — Progress Notes (Signed)
Paged MD Xu with stroke at [place] to inform of patient's significant increase in contracture of bilateral upper extremities and bilateral feet. Due to concern in status change, a stat head CT was ordered.

## 2018-04-02 NOTE — Procedures (Signed)
ELECTROENCEPHALOGRAM REPORT   Patient: Rebecca Cox       Room #: 4N16C EEG No. ID: [id number] Age: 58 y.o.        Sex: female Referring Physician: Estanislado Pandy Report Date:  04/02/2018        Interpreting Physician: Alexis Goodell  History: Shandy Checo is an 58 y.o. female with neurological change after aneurysm coiling  Medications:  ASA, Wellbutrin, Clonazepam, Fentanyl, Protonix, Zocor, Brilinta, Topamax, Effexor, Cleviprex, Neosynephrine   Conditions of Recording:  This is a 21 channel routine scalp EEG performed with bipolar and monopolar montages arranged in accordance to the international 10/20 system of electrode placement. One channel was dedicated to EKG recording.  The patient is in the intubated and sedated state.  Description:  The waking background activity is slow and poorly organized.  It consists of a low voltage, polymorphic delta activity that is continuous and diffusely distributed.  The patient is stimulated during the recording.  Muscle artifact is noted with stimulation but no activation of the background rhythm is noted.  No epileptiform activity is noted.   Hyperventilation and intermittent photic stimulation were not performed.   IMPRESSION: This is an abnormal EEG secondary to general background slowing.  This finding may be seen with a diffuse disturbance that is etiologically nonspecific, but may include a metabolic encephalopathy, among other possibilities.  No epileptiform activity was noted.     Alexis Goodell, MD Neurology [phone] 04/02/2018, 3:36 PM

## 2018-04-02 NOTE — Progress Notes (Signed)
STROKE TEAM PROGRESS NOTE   SUBJECTIVE (INTERVAL HISTORY) Her husband is at the bedside.  Overnight, patient developed increased muscle tone in all extremities, left UE more than right UE, equal bilateral lower thromboses.  Also increased muscle tone in the neck.  Slight decorticate posturing on stimulation.  Repeat CT, no hemorrhage, stable lateral ACA and MCA/ACA watershed infarcts.  Still intubated, BP low Neo-Synephrine.   OBJECTIVE Temp:  [97.3 F (36.3 C)-99.1 F (37.3 C)] 99.1 F (37.3 C) (01/28 1200) Pulse Rate:  [70-99] 99 (01/28 1127) Cardiac Rhythm: Normal sinus rhythm (01/27 2000) Resp:  [14-22] 17 (01/28 1127) BP: (83-135)/(58-109) 135/67 (01/28 1127) SpO2:  [100 %] 100 % (01/28 1127) Arterial Line BP: (85-137)/(71-90) 118/73 (01/28 0315) FiO2 (%):  [40 %] 40 % (01/28 1128)  Recent Labs  Lab 03/26/2018 1938 03/06/2018 2307 04/02/18 0321 04/02/18 0741 04/02/18 1120  GLUCAP 175* 144* 124* 97 108*   Recent Labs  Lab 03/16/2018 0653 03/19/2018 1141 03/16/2018 1214 03/30/2018 1320 03/18/2018 1608 04/02/18 0449  NA 141 143 144 142 136 139  K 4.1 4.2 3.8 4.5 4.4 4.6  CL 111  --   --   --  111 115*  CO2 21*  --   --   --  17* 18*  GLUCOSE 83 125*  --   --  165* 127*  BUN 16  --   --   --  13 15  CREATININE 1.30*  --   --   --  1.04* 1.05*  CALCIUM 9.8  --   --   --  9.3 8.9  MG  --   --   --   --   --  1.6*  PHOS  --   --   --   --   --  4.1   No results for input(s): AST, ALT, ALKPHOS, BILITOT, PROT, ALBUMIN in the last 168 hours. Recent Labs  Lab 03/10/2018 0653  03/22/2018 1214 03/12/2018 1320 04/02/2018 1608 03/09/2018 2022 04/02/18 0449  WBC 7.3  --   --   --  8.3 16.1* 15.6*  NEUTROABS 4.9  --   --   --   --   --  13.1*  HGB 12.8   < > 6.1* 7.1* 11.2* 12.5 11.6*  HCT 42.9   < > 18.0* 21.0* 33.6* 38.4 34.7*  MCV 91.7  --   --   --  88.0 86.7 87.0  PLT 212  --   --   --  126* 159 152   < > = values in this interval not displayed.   No results for input(s): CKTOTAL,  CKMB, CKMBINDEX, TROPONINI in the last 168 hours. Recent Labs    03/12/2018 0653 03/26/2018 1608  LABPROT 13.4 15.5*  INR 1.03 1.24   No results for input(s): COLORURINE, LABSPEC, PHURINE, GLUCOSEU, HGBUR, BILIRUBINUR, KETONESUR, PROTEINUR, UROBILINOGEN, NITRITE, LEUKOCYTESUR in the last 72 hours.  Invalid input(s): APPERANCEUR     Component Value Date/Time   CHOL 175 03/11/2018 0624   TRIG 155 (H) 03/11/2018 0624   HDL 48 03/11/2018 0624   CHOLHDL 3.6 03/11/2018 0624   VLDL 31 03/11/2018 0624   LDLCALC 96 03/11/2018 0624   Lab Results  Component Value Date   HGBA1C 4.7 (L) 03/11/2018      Component Value Date/Time   LABOPIA NONE DETECTED 03/10/2018 1210   COCAINSCRNUR NONE DETECTED 03/10/2018 1210   LABBENZ POSITIVE (A) 03/10/2018 1210   AMPHETMU NONE DETECTED 03/10/2018 1210   THCU NONE DETECTED 03/10/2018  [place] 03/10/2018 1210    No results for input(s): ETH in the last 168 hours.  I have personally reviewed the radiological images below and agree with the radiology interpretations.  Ct Head Wo Contrast  Result Date: 04/02/2018 CLINICAL DATA:  Stroke, follow-up. EXAM: CT HEAD WITHOUT CONTRAST TECHNIQUE: Contiguous axial images were obtained from the base of the skull through the vertex without intravenous contrast. COMPARISON:  MRI brain 03/20/2018. FINDINGS: Brain: Bilateral watershed territory infarcts are confirmed as hypoattenuation involving the cortex, left greater than right. There is no associated hemorrhage. Left ACA stent is stable in position. There is no hemorrhage or new infarct. Ventricles are of normal size. Insert pass fluid Vascular: ACA stent is in place. No significant atherosclerotic calcifications are present. There is no hyperdense vessel. Skull: Calvarium is intact. No focal lytic or blastic lesions are present. Sinuses/Orbits: The paranasal sinuses and mastoid air cells are clear. The globes and orbits are within normal limits.  IMPRESSION: 1. Expected progression of bilateral watershed territory infarcts, left greater than right. 2. No new infarct territory or hemorrhage. 3. ACA stent. Electronically Signed   By: San Morelle M.D.   On: 04/02/2018 09:51   Ct Head Wo Contrast  Result Date: 03/10/2018 CLINICAL DATA:  Acute onset dizziness and difficulty walking today. EXAM: CT HEAD WITHOUT CONTRAST TECHNIQUE: Contiguous axial images were obtained from the base of the skull through the vertex without intravenous contrast. COMPARISON:  None. FINDINGS: Brain: No evidence of acute infarction, hemorrhage, hydrocephalus, extra-axial collection or mass lesion/mass effect. Vascular: No hyperdense vessel or unexpected calcification. Skull: Normal. Negative for fracture or focal lesion. Sinuses/Orbits: Negative. Other: None. IMPRESSION: Negative head CT. Electronically Signed   By: Inge Rise M.D.   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 Acute onset of right arm and leg weakness. EXAM: MRI HEAD WITHOUT CONTRAST MRA HEAD WITHOUT CONTRAST TECHNIQUE: Multiplanar, multiecho pulse sequences of the brain and surrounding structures were obtained without intravenous contrast. Angiographic images of the head were obtained using MRA technique without contrast. COMPARISON:  Angiographic images same day.  MRI 03/12/2018. FINDINGS: MRI HEAD FINDINGS Brain: The brain shows bilateral watershed distribution cortical and subcortical edema and restricted diffusion, affecting both hemispheres but more extensive on the left than the right. The question is if this represents true bilateral watershed infarction or if it represents a vaso regulatory disturbance related to spasm. No specific vascular territory infarction is seen. No sign of hemorrhage. Chronic small-vessel ischemic changes affect the brainstem and the cerebral  hemispheric white matter as seen previously. No hydrocephalus. No extra-axial collection. Vascular: Major vessels at the base of the brain show flow. 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 There does appear to be a spasm appearance of the A1 vessel on the left and both anterior cerebral arteries, more extensively the right than the left. 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 No middle cerebral artery abnormality is seen. Posterior circulation vessels continued to show a normal appearance. IMPRESSION: Bilateral watershed distribution cortical and subcortical edema and restricted diffusion, more extensive on the left than the right. Differential diagnosis is true watershed infarction versus vaso regulatory disturbance secondary to arterial spasm, which is demonstrated in the anterior cerebral vessels by  MR angiography. 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 Electronically Signed   By: Nelson Chimes M.D.   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 Acute onset of right arm and leg weakness. EXAM: MRI HEAD WITHOUT CONTRAST MRA HEAD WITHOUT CONTRAST TECHNIQUE: Multiplanar, multiecho pulse sequences of the brain and surrounding structures were obtained without intravenous contrast. Angiographic images of the head were obtained using MRA technique without contrast. COMPARISON:  Angiographic images same day.  MRI 03/12/2018. FINDINGS: MRI HEAD FINDINGS Brain: The brain shows bilateral watershed distribution cortical and subcortical edema and restricted diffusion, affecting both hemispheres but more extensive on the left than the right. The question is if this represents true bilateral  watershed infarction or if it represents a vaso regulatory disturbance related to spasm. No specific vascular territory infarction is seen. No sign of hemorrhage. Chronic small-vessel ischemic changes affect the brainstem and the cerebral hemispheric white matter as seen previously. No hydrocephalus. No extra-axial collection. Vascular: Major vessels at the base of the brain show flow. 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 There does appear to be a spasm appearance of the A1 vessel on the left and both anterior cerebral arteries, more extensively the right than the left. 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 No middle cerebral artery abnormality is seen. Posterior circulation vessels continued to show a normal appearance. IMPRESSION: Bilateral watershed distribution cortical and subcortical edema and restricted diffusion, more extensive on the left than the right. Differential diagnosis is true watershed infarction versus vaso regulatory disturbance secondary to arterial spasm, which is demonstrated in the anterior cerebral vessels by MR angiography. 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 Electronically Signed   By: Nelson Chimes M.D.   On: 03/30/2018 16:02   Mr Brain Wo Contrast  Result Date: 03/11/2018 CLINICAL DATA:  Headache, new, malignancy suspected. Dizziness and weakness for 5 days EXAM: MRI HEAD WITHOUT CONTRAST MRA HEAD WITHOUT CONTRAST TECHNIQUE: Multiplanar, multiecho pulse sequences of the brain and surrounding structures were obtained without intravenous contrast. Angiographic images of the head were obtained using MRA technique without contrast. COMPARISON:  None. FINDINGS: MRI HEAD FINDINGS Brain: No acute infarction, hemorrhage, hydrocephalus, extra-axial collection or mass lesion.  Mild patchy FLAIR hyperintensity in the pons. Even milder periventricular FLAIR hyperintensity. Normal brain volume. Partially empty sella considered incidental in isolation. Vascular: Arterial findings below. Normal dural venous sinus flow voids. 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 No major branch occlusion or flow limiting stenosis. Negative for vessel beading. IMPRESSION: Brain MRI: 1. No acute finding or specific explanation for headache. 2. Mild signal abnormality in the pons and periventricular white matter that is usually from chronic small vessel ischemia. Intracranial MRA: 1. 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 2. No emergent finding. Electronically Signed   By: Monte Fantasia M.D.   On: 03/11/2018 09:08   Mr Brain W Contrast  Result Date: 03/12/2018 CLINICAL DATA:  Liver cancer. Headache. Rule out metastatic disease. EXAM: MRI HEAD WITH  CONTRAST TECHNIQUE: Multiplanar, multiecho pulse sequences of the brain and surrounding structures were obtained with intravenous contrast. CONTRAST:  10 mL Gadovist IV COMPARISON:  Unenhanced MRI head 03/11/2018 FINDINGS: Normal enhancement. No enhancing mass lesion. Leptomeningeal enhancement normal. No acute abnormality. See recent unenhanced MRI report. IMPRESSION: No acute abnormality and negative for intracranial metastatic disease. Electronically Signed   By: Franchot Gallo M.D.   On: 03/12/2018 11:16   US Carotid Bilateral (at [facility] And [facility] Only)  Result Date: 03/11/2018 CLINICAL DATA:  58 year old female with history of vertigo EXAM: BILATERAL CAROTID DUPLEX ULTRASOUND TECHNIQUE: Pearline Cables scale imaging, color Doppler and duplex ultrasound were performed of bilateral carotid and vertebral arteries in  the neck. COMPARISON:  None. FINDINGS: Criteria: Quantification of carotid stenosis is based on velocity parameters that correlate the residual internal carotid diameter with NASCET-based stenosis levels, using the diameter of the distal internal carotid lumen as the denominator for stenosis measurement. The following velocity measurements were obtained: RIGHT ICA:  Systolic 91 cm/sec, Diastolic 34 cm/sec CCA:  353 cm/sec SYSTOLIC ICA/CCA RATIO:  0.9 ECA:  96 cm/sec LEFT ICA:  Systolic 78 cm/sec, Diastolic 38 cm/sec CCA:  93 cm/sec SYSTOLIC ICA/CCA RATIO:  0.8 ECA:  77 cm/sec Right Brachial SBP: Not acquired Left Brachial SBP: Not acquired RIGHT CAROTID ARTERY: No significant calcified disease of the right common carotid artery. Intermediate waveform maintained. Heterogeneous plaque without significant calcifications at the right carotid bifurcation. Low resistance waveform of the right ICA. No significant tortuosity. RIGHT VERTEBRAL ARTERY: Antegrade flow with low resistance waveform. LEFT CAROTID ARTERY: No significant calcified disease of the left common carotid artery. Intermediate waveform maintained. Heterogeneous plaque at the left carotid bifurcation without significant calcifications. Low resistance waveform of the left ICA. LEFT VERTEBRAL ARTERY:  Antegrade flow with low resistance waveform. Additional: Right thyroid nodule measures 1.8 cm. IMPRESSION: Color duplex indicates minimal heterogeneous plaque, with no hemodynamically significant stenosis by duplex criteria in the extracranial cerebrovascular circulation. Incidental finding of right thyroid nodule measuring 1.8 cm. Dedicated thyroid ultrasound recommended if further characterisation warranted. Signed, Dulcy Fanny. Dellia Nims, RPVI [facility] Electronically Signed   By: Corrie Mckusick D.O.   On: 03/11/2018 09:41   Mr Jodene Nam Head/brain GD Cm  Result Date: 03/11/2018 CLINICAL DATA:  Headache,  new, malignancy suspected. Dizziness and weakness for 5 days EXAM: MRI HEAD WITHOUT CONTRAST MRA HEAD WITHOUT CONTRAST TECHNIQUE: Multiplanar, multiecho pulse sequences of the brain and surrounding structures were obtained without intravenous contrast. Angiographic images of the head were obtained using MRA technique without contrast. COMPARISON:  None. FINDINGS: MRI HEAD FINDINGS Brain: No acute infarction, hemorrhage, hydrocephalus, extra-axial collection or mass lesion. Mild patchy FLAIR hyperintensity in the pons. Even milder periventricular FLAIR hyperintensity. Normal brain volume. Partially empty sella considered incidental in isolation. Vascular: Arterial findings below. Normal dural venous sinus flow voids. 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 No major branch occlusion or flow limiting stenosis. Negative for vessel beading. IMPRESSION: Brain MRI: 1. No acute finding or specific explanation for headache. 2. Mild signal abnormality in the pons and periventricular white matter that is usually from chronic small vessel ischemia. Intracranial MRA: 1. 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 2. No emergent finding. Electronically Signed  By: Monte Fantasia M.D.   On: 03/11/2018 09:08    PHYSICAL EXAM  Temp:  [97.3 F (36.3 C)-99.1 F (37.3 C)] 99.1 F (37.3 C) (01/28 1200) Pulse Rate:  [70-99] 99 (01/28 1127) Resp:  [14-22] 17 (01/28 1127) BP: (83-135)/(58-109) 135/67 (01/28 1127) SpO2:  [100 %] 100 % (01/28 1127) Arterial Line BP: (85-137)/(71-90) 118/73 (01/28 0315) FiO2 (%):  [40 %] 40 % (01/28 1128)  General - Well nourished, well developed, intubated on sedation.  Ophthalmologic - fundi not visualized due to  noncooperation.  Cardiovascular - Regular rate and rhythm.  Neuro - intubated on sedation, eyes spontaneously open, but not following commands. Eeyes in slight left gaze position, inconsistently blinking to visual threat bilaterally, doll's eyes present, not tracking, PERRL. Corneal reflex present bilaterally, gag and cough present. Breathing over the vent.  Facial symmetry not able to test due to ET tube.  Tongue midline in mouth. On pain stimulation, mild decorticate posturing.  Increased muscle tone on all extremities, left upper extremity flexion more than right upper extremity.  Increased muscle tone in the neck, difficulty with neck flexion.  DTR 1+ and positive bilateral babinski. Sensation, coordination and gait not tested.   ASSESSMENT/PLAN Rebecca Cox is a 58 y.o. female with history of CKD, CAD, OSA, liver tumor, cerebral aneurysm admitted for arm leg weakness found after aneurysm coiling. No tPA given due to right groin severe bleeding.    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  Ongoing aggressive stroke risk factor management  Therapy recommendations: Pending  Disposition: Pending  Increased muscle tone  Patient developed diffuse increased muscle tone in all extremities and the neck  Likely due to bilateral ACA and MCA/ACA infarcts  EEG pending to rule out seizure  Resume home Wellbutrin and Effexor as well as Topamax  Resume home Klonopin 0.5 mg twice  daily  Supportive care with PT/OT  ACOM aneurysm  Status post ACOM stent placement  On aspirin and Brilinta  Heparin IV discontinued  Dr.Deveshwar following  Right groin hematoma with severe anemia due to blood loss  Status post cerebral angiogram  Hemoglobin down from 12.8->6.1, received 4 units PRBC  hemoglobin 11.6 in a.m.  VVS on board  Status post right deep femoral artery repair  Close vascular check  Heparin IV discontinued  Continue aspirin and Brilinta  Hypotension . Stable on Neo-Synephrine . Hypotension during procedure, as low as 80s . CCM on board . Continue Neo-Synephrine  Long term BP goal normotensive  Hyperlipidemia  Home meds:  Zocor 20   LDL 96, goal < 70  Now on Zocor 40  Continue statin at discharge  Other Stroke Risk Factors  Obesity, Body mass index is 34.95 kg/m.   Obstructive sleep apnea, on CPAP at home  Other Active Problems  Creatinine 1.04-1.05  Leukocytosis 16.1-15.6  Hospital day # 1  This patient is critically ill due to bilateral ACA, MCA/ACA territory infarcts, severe anemia requiring blood transfusion, groin hematoma with femoral artery  repair, hypotension, leukocytosis and at significant risk of neurological worsening, death form recurrent stroke, hemorrhagic conversion, seizure, hypovolemic shock, sepsis. This patient's care requires constant monitoring of vital signs, hemodynamics, respiratory and cardiac monitoring, review of multiple databases, neurological assessment, discussion with family, other specialists and medical decision making of high complexity. I spent 40 minutes of neurocritical care time in the care of this patient. I had long discussion with husband at bedside, updated pt current condition, treatment plan and potential prognosis. He expressed understanding and appreciation. I also discussed with Dr. Lake Bells.   Rebecca Hawking, Rebecca Cox Stroke Neurology 04/02/2018 12:37 PM    To contact Stroke  Continuity provider, please refer to [email or web address] After hours, contact General Neurology

## 2018-04-03 ENCOUNTER — Inpatient Hospital Stay: Payer: Self-pay

## 2018-04-03 ENCOUNTER — Inpatient Hospital Stay (HOSPITAL_COMMUNITY): Payer: Medicare Other

## 2018-04-03 ENCOUNTER — Encounter (HOSPITAL_COMMUNITY): Payer: Self-pay | Admitting: Interventional Radiology

## 2018-04-03 DIAGNOSIS — J9600 Acute respiratory failure, unspecified whether with hypoxia or hypercapnia: Secondary | ICD-10-CM

## 2018-04-03 DIAGNOSIS — N179 Acute kidney failure, unspecified: Secondary | ICD-10-CM

## 2018-04-03 LAB — CORTISOL: Cortisol, Plasma: 19.3 ug/dL

## 2018-04-03 LAB — BASIC METABOLIC PANEL
Anion gap: 4 — ABNORMAL LOW (ref 5–15)
BUN: 32 mg/dL — ABNORMAL HIGH (ref 6–20)
CALCIUM: 8.5 mg/dL — AB (ref 8.9–10.3)
CO2: 19 mmol/L — ABNORMAL LOW (ref 22–32)
CREATININE: 1.78 mg/dL — AB (ref 0.44–1.00)
Chloride: 118 mmol/L — ABNORMAL HIGH (ref 98–111)
GFR calc Af Amer: 37 mL/min — ABNORMAL LOW (ref >60)
GFR, EST NON AFRICAN AMERICAN: 32 mL/min — AB (ref >60)
Glucose, Bld: 138 mg/dL — ABNORMAL HIGH (ref 70–99)
Potassium: 4.2 mmol/L (ref 3.5–5.1)
Sodium: 141 mmol/L (ref 135–145)

## 2018-04-03 LAB — CBC
HCT: 32.2 % — ABNORMAL LOW (ref 36.0–46.0)
Hemoglobin: 9.9 g/dL — ABNORMAL LOW (ref 12.0–15.0)
MCH: 28.7 pg (ref 26.0–34.0)
MCHC: 30.7 g/dL (ref 30.0–36.0)
MCV: 93.3 fL (ref 80.0–100.0)
Platelets: 115 10*3/uL — ABNORMAL LOW (ref 150–400)
RBC: 3.45 MIL/uL — ABNORMAL LOW (ref 3.87–5.11)
RDW: 15.8 % — ABNORMAL HIGH (ref 11.5–15.5)
WBC: 19 10*3/uL — AB (ref 4.0–10.5)
nRBC: 0.2 % (ref 0.0–0.2)

## 2018-04-03 LAB — GLUCOSE, CAPILLARY
GLUCOSE-CAPILLARY: 108 mg/dL — AB (ref 70–99)
GLUCOSE-CAPILLARY: 117 mg/dL — AB (ref 70–99)
Glucose-Capillary: 126 mg/dL — ABNORMAL HIGH (ref 70–99)
Glucose-Capillary: 128 mg/dL — ABNORMAL HIGH (ref 70–99)
Glucose-Capillary: 90 mg/dL (ref 70–99)
Glucose-Capillary: 118 mg/dL — ABNORMAL HIGH (ref 70–99)
Glucose-Capillary: 127 mg/dL — ABNORMAL HIGH (ref 70–99)
Glucose-Capillary: 113 mg/dL — ABNORMAL HIGH (ref 70–99)
Glucose-Capillary: 143 mg/dL — ABNORMAL HIGH (ref 70–99)

## 2018-04-03 LAB — PHOSPHORUS
Phosphorus: 3.2 mg/dL (ref 2.5–4.6)
Phosphorus: 1.9 mg/dL — ABNORMAL LOW (ref 2.5–4.6)

## 2018-04-03 LAB — MAGNESIUM
Magnesium: 1.6 mg/dL — ABNORMAL LOW (ref 1.7–2.4)
Magnesium: 1.5 mg/dL — ABNORMAL LOW (ref 1.7–2.4)

## 2018-04-03 LAB — PLATELET INHIBITION P2Y12: Platelet Function  P2Y12: 104 [PRU] — ABNORMAL LOW (ref 194–418)

## 2018-04-03 LAB — BLOOD PRODUCT ORDER (VERBAL) VERIFICATION

## 2018-04-03 MED ORDER — LACTATED RINGERS IV SOLN
INTRAVENOUS | Status: DC
  Administered 2018-04-03 – 2018-04-05 (×3): via INTRAVENOUS

## 2018-04-03 MED ORDER — MAGNESIUM SULFATE 2 GM/50ML IV SOLN
2.0000 g | Freq: Once | INTRAVENOUS | Status: AC
  Administered 2018-04-03: 2 g via INTRAVENOUS
  Filled 2018-04-03: qty 50

## 2018-04-03 MED ORDER — FENTANYL CITRATE (PF) 100 MCG/2ML IJ SOLN
100.0000 ug | INTRAMUSCULAR | Status: AC | PRN
  Administered 2018-04-05 (×3): 100 ug via INTRAVENOUS
  Filled 2018-04-03 (×3): qty 2

## 2018-04-03 MED ORDER — DEXMEDETOMIDINE HCL IN NACL 200 MCG/50ML IV SOLN: 0.4000 ug/kg/h | INTRAVENOUS | Status: DC

## 2018-04-03 MED ORDER — VITAL HIGH PROTEIN PO LIQD
1000.0000 mL | ORAL | Status: DC
  Administered 2018-04-03 – 2018-04-08 (×6): 1000 mL
  Filled 2018-04-03 (×2): qty 1000

## 2018-04-03 MED ORDER — ADULT MULTIVITAMIN W/MINERALS CH
1.0000 | ORAL_TABLET | Freq: Two times a day (BID) | ORAL | Status: DC
  Administered 2018-04-03 – 2018-04-10 (×14): 1
  Filled 2018-04-03 (×14): qty 1

## 2018-04-03 MED ORDER — SODIUM PHOSPHATES 45 MMOLE/15ML IV SOLN
10.0000 mmol | Freq: Once | INTRAVENOUS | Status: DC
  Filled 2018-04-03: qty 3.33

## 2018-04-03 MED ORDER — SODIUM PHOSPHATES 45 MMOLE/15ML IV SOLN
20.0000 mmol | Freq: Once | INTRAVENOUS | Status: AC
  Administered 2018-04-03: 20 mmol via INTRAVENOUS
  Filled 2018-04-03: qty 6.67

## 2018-04-03 MED ORDER — DEXMEDETOMIDINE HCL IN NACL 200 MCG/50ML IV SOLN
0.0000 ug/kg/h | INTRAVENOUS | Status: DC
  Administered 2018-04-03: 0.4 ug/kg/h via INTRAVENOUS
  Administered 2018-04-03: 0.8 ug/kg/h via INTRAVENOUS
  Administered 2018-04-03: 0.4 ug/kg/h via INTRAVENOUS
  Administered 2018-04-03: 0.6 ug/kg/h via INTRAVENOUS
  Administered 2018-04-04 (×3): 1 ug/kg/h via INTRAVENOUS
  Administered 2018-04-04 (×2): 1.2 ug/kg/h via INTRAVENOUS
  Administered 2018-04-04: 1 ug/kg/h via INTRAVENOUS
  Filled 2018-04-03 (×11): qty 50

## 2018-04-03 MED ORDER — FENTANYL CITRATE (PF) 100 MCG/2ML IJ SOLN
100.0000 ug | INTRAMUSCULAR | Status: DC | PRN
  Administered 2018-04-04 – 2018-04-05 (×13): 100 ug via INTRAVENOUS
  Filled 2018-04-03 (×14): qty 2

## 2018-04-03 MED ORDER — ADULT MULTIVITAMIN W/MINERALS CH
1.0000 | ORAL_TABLET | Freq: Every day | ORAL | Status: DC
  Administered 2018-04-03: 1
  Filled 2018-04-03: qty 1

## 2018-04-03 NOTE — Progress Notes (Signed)
RT NOTE: RT transported patient on ventilator from room 4N16 to MRI and back to room 4N16 with no apparent complications. Vitals are stable. RT will continue to monitor.

## 2018-04-03 NOTE — Progress Notes (Signed)
NAME:  Rebecca Cox, MRN:  [id number], DOB:  1960/09/03, LOS: 2 ADMISSION DATE:  03/13/2018, CONSULTATION DATE: 03/14/2018 REFERRING MD:  Patrecia Pour, CHIEF COMPLAINT: Acute respiratory failure  Brief History   This is a 58 year old female with history of liver cancer and cerebral aneurysm who presents to PCCM for vent management postop.  Patient have the aneurysm coiled in during the procedure developed a bifrontal CVA and a right groin hematoma.  She returned to the OR for evacuation of the hematoma and repair of the vessel.  Heparin was reversed and patient was transfused 2 units of packed red blood cells.  Past Medical History   Past Medical History:  Diagnosis Date  . Agoraphobia   . Arthritis   . Cancer ([place])    liver  . Cerebral aneurysm   . Chronic kidney disease    stage 3  . Coronary artery disease   . Depression   . Diverticulitis   . Esophageal hernia   . Family history of adverse reaction to anesthesia    Pt suspects that " mother had cardiac arrest from anesthesia- not exactly sure"   . GERD (gastroesophageal reflux disease)   . Headache   . Sleep apnea    does not wear CPAP  . Thyroid nodule   . Wears glasses   . Wears partial dentures    Significant Hospital Events   1/27 > anterior communicating aneurysm embolization, post procedure code stroke, right femoral hematoma  Consults:  PCCM Neurology Vascular Surgery  Procedures:  1/27 >  anterior communicating aneurysm embolization 1/27 > ETT 1/27 R aline >> 1/28  Significant Diagnostic Tests:  MR I 03/17/2018 >>  Bilateral watershed distribution cortical and subcortical edema and restricted diffusion, more extensive on the left than the right.  Differential diagnosis is true watershed infarction versus vaso regulatory disturbance secondary to arterial spasm, which is demonstrated in the anterior cerebral vessels by MR angiography. Previously seen 5 mm anterior communicating region aneurysm remains  visible.  1/28 CTH >> 1. Expected progression of bilateral watershed territory infarcts, left greater than right. 2. No new infarct territory or hemorrhage. 3. ACA stent.  1/28 EEG> abnormal EEG, generalized background slowing. No epileptiform activity.   1/29 CT Head>  Stable watershed distribution bilaterally, new acute intracranial abnormality   Micro Data:  1/27 MRSA PCR >>neg  Antimicrobials:   1/27 cefazolin preop  Interim history/subjective:  Received IVF bolus overnight for oliguria Remains intubated, sedated, and requiring neo gtt for blood pressure goal   Objective   Blood pressure 91/61, pulse (!) 105, temperature 99.9 F (37.7 C), temperature source Axillary, resp. rate 14, height 5\' 5"  (1.651 m), weight 95.3 kg, SpO2 100 %.    Vent Mode: PRVC FiO2 (%):  [40 %] 40 % Set Rate:  [16 bmp] 16 bmp Vt Set:  [450 mL] 450 mL PEEP:  [5 cmH20] 5 cmH20 Plateau Pressure:  [12 cmH20-17 cmH20] 17 cmH20   Intake/Output Summary (Last 24 hours) at 04/03/2018 0847 Last data filed at 04/03/2018 0700 Gross per 24 hour  Intake 4812.75 ml  Output 385 ml  Net 4427.75 ml   Filed Weights   03/27/2018 0621  Weight: 95.3 kg    Examination: General:  WDWN adult female, sedated, intubated, no signs of acute distress HEENT: ETT and OGT secure. MMM, pink. Anicteric sclera.     Neuro: Sedated. Opens eyes to stimulation and command. PERRL 17mm. Contracted hands bilaterally. CV: RRR, no r/g/m. Radial pulses 1+ bilaterally. R groin wound  vac sealed appropriately. JP drain serosang ouput. Ecchymosis around insertion site, no active bleeding around insertion site. Capillary refill < 3 sec BUE BLE PULM: scant secretions. No adventitious lung sounds. Symmetrical chest wall expansion. Synchronous with vent  GI: obese, soft, round. Normoactive bowel sounds x4 Extremities: No obvious joint deformity. Scattered ecchymosis BLE. Skin: pale, clean, dry, intact, cool BLE to touch.   Resolved Hospital  Problem list   Acute blood loss anemia Assessment & Plan:     ACOM aneurysm s/p embolization -Intraoperative bilateral ACA CVA, L> R -ACA and MCA/MCA watershed infarcts - CT head 1/29 no progression of watershed injury -EEG 1/28 no epileptiform activity, generalized background slowing P:  Neurology following, appreciate recs Home BZD and AEDs restarted per neurology  Brilinta and ASA per Dr. Estanislado Pandy Serial neuro exams SBP goals 120-140, continue to titrate neo for goal, CVC not needed at this time. Given decreased UOP, planning to start mIVF. If hypotensive, consider IVF bolus vs increasing pressors Wean sedation as able  Acute respiratory insufficiency requiring mechanical ventilation -post operative insufficiency, contributing factors include CVA, sedation - CXR 1/28 stable lines, no acute process P:  Continue PRVC Titrate PEEP/FiO2 for goal SpO2>94% Current settings PRVC Rate 16 PEEP 5 FiO2 40% Does not meet criteria for SBT, mental status.  VAP prevention Pulmonary hygiene Continue PPI PAD- fentanyl- weaning as tolerated  CXR tomorrow  Right groin hematoma, s/p repair  -associated acute blood loss anemia, s/p [facility] 1/27 P:  Vascular surgery following, managing.  Monitor wound vac site for appropriate seal Monitor site for increased ecchymosis, increased JP output, frank bleeding  Continue to trend H/H, CBC pending 1/29  Oliguria -s/p 52ml NS overnight P Start mIVF LR 50/hr Strict I/O  Electrolyte abnormality -Hypomagnesemia   P:  -BMP pending 1/29 qAM BMP  Diabetes Mellitus  P:  Continue q4 CBG No SSI at this time, start SSI if > 180   Best practice:  Diet: Enteral Nutrition Pain/Anxiety/Delirium protocol (if indicated): Fentanyl VAP protocol (if indicated): Yes DVT prophylaxis:  SCD GI prophylaxis: protonix Glucose control: q4CBG Mobility: Bedrest Code Status: Full  Family Communication: No family at bedside Disposition:  Continue ICU level  of care   Labs   CBC:  Recent Labs  Lab 04/03/2018 0653  03/10/2018 1214 03/22/2018 1320 03/11/2018 1608 03/24/2018 2022 04/02/18 0449  WBC 7.3  --   --   --  8.3 16.1* 15.6*  NEUTROABS 4.9  --   --   --   --   --  13.1*  HGB 12.8   < > 6.1* 7.1* 11.2* 12.5 11.6*  HCT 42.9   < > 18.0* 21.0* 33.6* 38.4 34.7*  MCV 91.7  --   --   --  88.0 86.7 87.0  PLT 212  --   --   --  126* 159 152   < > = values in this interval not displayed.    Basic Metabolic Panel: Recent Labs  Lab 03/31/2018 0653 03/14/2018 1141 03/07/2018 1214 03/18/2018 1320 03/11/2018 1608 04/02/18 0449 04/02/18 1236 04/02/18 1648  NA 141 143 144 142 136 139  --   --   K 4.1 4.2 3.8 4.5 4.4 4.6  --   --   CL 111  --   --   --  111 115*  --   --   CO2 21*  --   --   --  17* 18*  --   --   GLUCOSE 83 125*  --   --  165* 127*  --   --   BUN 16  --   --   --  13 15  --   --   CREATININE 1.30*  --   --   --  1.04* 1.05*  --   --   CALCIUM 9.8  --   --   --  9.3 8.9  --   --   MG  --   --   --   --   --  1.6* 1.4* 1.5*  PHOS  --   --   --   --   --  4.1 3.8 3.9   GFR: Estimated Creatinine Clearance: 69.1 mL/min (A) (by C-G formula based on SCr of 1.05 mg/dL (H)). Recent Labs  Lab 03/31/2018 0653 03/20/2018 1608 03/14/2018 2022 04/02/18 0449  WBC 7.3 8.3 16.1* 15.6*    Liver Function Tests: No results for input(s): AST, ALT, ALKPHOS, BILITOT, PROT, ALBUMIN in the last 168 hours. No results for input(s): LIPASE, AMYLASE in the last 168 hours. No results for input(s): AMMONIA in the last 168 hours.  ABG    Component Value Date/Time   PHART 7.318 (L) 04/02/2018 0413   PCO2ART 37.9 04/02/2018 0413   PO2ART 166 (H) 04/02/2018 0413   HCO3 18.9 (L) 04/02/2018 0413   TCO2 20 (L) 03/13/2018 1320   ACIDBASEDEF 6.1 (H) 04/02/2018 0413   O2SAT 98.6 04/02/2018 0413     Coagulation Profile: Recent Labs  Lab 03/25/2018 0653 04/05/2018 1608  INR 1.03 1.24    Cardiac Enzymes: Recent Labs  Lab 04/02/18 1236  CKTOTAL 103     HbA1C: Hgb A1c MFr Bld  Date/Time Value Ref Range Status  03/11/2018 06:22 AM 4.7 (L) 4.8 - 5.6 % Final    Comment:    (NOTE) Pre diabetes:          5.7%-6.4% Diabetes:              >6.4% Glycemic control for   <7.0% adults with diabetes     CBG: Recent Labs  Lab 04/02/2018 1938 03/14/2018 2307 04/02/18 0321 04/02/18 0741 04/02/18 1120  GLUCAP 175* 144* 124* 97 108*   Critical Care Time: 40 minutes    Eliseo Gum MSN, AGACNP-BC [place] 04/03/2018, 8:47 AM

## 2018-04-03 NOTE — Plan of Care (Signed)
Pt currently on tube feeds.  Once extubated patient will need a swallow evaluation to determine diet order. Will continue to monitor. Hiram Gash RN

## 2018-04-03 NOTE — Progress Notes (Signed)
Patient transported to CT and back to 2J19 with no complications. Vitals stable.

## 2018-04-03 NOTE — Progress Notes (Signed)
STROKE TEAM PROGRESS NOTE   SUBJECTIVE (INTERVAL HISTORY) Her husband is at the bedside. No significant neuro changes over night. Still increase tone at neck, LUE and BLEs. Eyes open but not responding. Still intubated, BP low still on Neo-Synephrine. oligouria with elevated Cre, also on RL @ 50. Off sedation now.    OBJECTIVE Temp:  [98.6 F (37 C)-99.9 F (37.7 C)] 99.9 F (37.7 C) (01/29 0800) Pulse Rate:  [86-120] 101 (01/29 1000) Cardiac Rhythm: Normal sinus rhythm;Sinus tachycardia (01/29 0800) Resp:  [13-25] 15 (01/29 1000) BP: (90-152)/(48-112) 98/67 (01/29 1000) SpO2:  [93 %-100 %] 100 % (01/29 1000) FiO2 (%):  [40 %] 40 % (01/29 0719)  Recent Labs  Lab 04/02/18 1518 04/02/18 1936 04/02/18 2307 04/03/18 0351 04/03/18 0725  GLUCAP 126* 128* 108* 90 118*   Recent Labs  Lab 03/10/2018 0653 03/15/2018 1141 03/20/2018 1214 03/12/2018 1320 03/25/2018 1608 04/02/18 0449 04/02/18 1236 04/02/18 1648 04/03/18 0724  NA 141 143 144 142 136 139  --   --  141  K 4.1 4.2 3.8 4.5 4.4 4.6  --   --  4.2  CL 111  --   --   --  111 115*  --   --  118*  CO2 21*  --   --   --  17* 18*  --   --  19*  GLUCOSE 83 125*  --   --  165* 127*  --   --  138*  BUN 16  --   --   --  13 15  --   --  32*  CREATININE 1.30*  --   --   --  1.04* 1.05*  --   --  1.78*  CALCIUM 9.8  --   --   --  9.3 8.9  --   --  8.5*  MG  --   --   --   --   --  1.6* 1.4* 1.5* 1.6*  PHOS  --   --   --   --   --  4.1 3.8 3.9 3.2   No results for input(s): AST, ALT, ALKPHOS, BILITOT, PROT, ALBUMIN in the last 168 hours. Recent Labs  Lab 03/23/2018 0653  04/02/2018 1214 03/30/2018 1320 03/11/2018 1608 03/31/2018 2022 04/02/18 0449  WBC 7.3  --   --   --  8.3 16.1* 15.6*  NEUTROABS 4.9  --   --   --   --   --  13.1*  HGB 12.8   < > 6.1* 7.1* 11.2* 12.5 11.6*  HCT 42.9   < > 18.0* 21.0* 33.6* 38.4 34.7*  MCV 91.7  --   --   --  88.0 86.7 87.0  PLT 212  --   --   --  126* 159 152   < > = values in this interval not  displayed.   Recent Labs  Lab 04/02/18 1236  CKTOTAL 103   Recent Labs    03/29/2018 0653 04/05/2018 1608  LABPROT 13.4 15.5*  INR 1.03 1.24   No results for input(s): COLORURINE, LABSPEC, PHURINE, GLUCOSEU, HGBUR, BILIRUBINUR, KETONESUR, PROTEINUR, UROBILINOGEN, NITRITE, LEUKOCYTESUR in the last 72 hours.  Invalid input(s): APPERANCEUR     Component Value Date/Time   CHOL 175 03/11/2018 0624   TRIG 155 (H) 03/11/2018 0624   HDL 48 03/11/2018 0624   CHOLHDL 3.6 03/11/2018 0624   VLDL 31 03/11/2018 0624   LDLCALC 96 03/11/2018 0624   Lab Results  Component Value Date  HGBA1C 4.7 (L) 03/11/2018      Component Value Date/Time   LABOPIA NONE DETECTED 03/10/2018 1210   COCAINSCRNUR NONE DETECTED 03/10/2018 1210   LABBENZ POSITIVE (A) 03/10/2018 1210   AMPHETMU NONE DETECTED 03/10/2018 1210   THCU NONE DETECTED 03/10/2018 1210   LABBARB NONE DETECTED 03/10/2018 1210    No results for input(s): ETH in the last 168 hours.  I have personally reviewed the radiological images below and agree with the radiology interpretations.  Ct Head Wo Contrast  Result Date: 04/02/2018 CLINICAL DATA:  Stroke, follow-up. EXAM: CT HEAD WITHOUT CONTRAST TECHNIQUE: Contiguous axial images were obtained from the base of the skull through the vertex without intravenous contrast. COMPARISON:  MRI brain 03/10/2018. FINDINGS: Brain: Bilateral watershed territory infarcts are confirmed as hypoattenuation involving the cortex, left greater than right. There is no associated hemorrhage. Left ACA stent is stable in position. There is no hemorrhage or new infarct. Ventricles are of normal size. Insert pass fluid Vascular: ACA stent is in place. No significant atherosclerotic calcifications are present. There is no hyperdense vessel. Skull: Calvarium is intact. No focal lytic or blastic lesions are present. Sinuses/Orbits: The paranasal sinuses and mastoid air cells are clear. The globes and orbits are within  normal limits. IMPRESSION: 1. Expected progression of bilateral watershed territory infarcts, left greater than right. 2. No new infarct territory or hemorrhage. 3. ACA stent. Electronically Signed   By: San Morelle M.D.   On: 04/02/2018 09:51   Ct Head Wo Contrast  Result Date: 03/10/2018 CLINICAL DATA:  Acute onset dizziness and difficulty walking today. EXAM: CT HEAD WITHOUT CONTRAST TECHNIQUE: Contiguous axial images were obtained from the base of the skull through the vertex without intravenous contrast. COMPARISON:  None. FINDINGS: Brain: No evidence of acute infarction, hemorrhage, hydrocephalus, extra-axial collection or mass lesion/mass effect. Vascular: No hyperdense vessel or unexpected calcification. Skull: Normal. Negative for fracture or focal lesion. Sinuses/Orbits: Negative. Other: None. IMPRESSION: Negative head CT. Electronically Signed   By: Inge Rise M.D.   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 Acute onset of right arm and leg weakness. EXAM: MRI HEAD WITHOUT CONTRAST MRA HEAD WITHOUT CONTRAST TECHNIQUE: Multiplanar, multiecho pulse sequences of the brain and surrounding structures were obtained without intravenous contrast. Angiographic images of the head were obtained using MRA technique without contrast. COMPARISON:  Angiographic images same day.  MRI 03/12/2018. FINDINGS: MRI HEAD FINDINGS Brain: The brain shows bilateral watershed distribution cortical and subcortical edema and restricted diffusion, affecting both hemispheres but more extensive on the left than the right. The question is if this represents true bilateral watershed infarction or if it represents a vaso regulatory disturbance related to spasm. No specific vascular territory infarction is seen. No sign of hemorrhage. Chronic small-vessel ischemic changes affect the brainstem and  the cerebral hemispheric white matter as seen previously. No hydrocephalus. No extra-axial collection. Vascular: Major vessels at the base of the brain show flow. 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 There does appear to be a spasm appearance of the A1 vessel on the left and both anterior cerebral arteries, more extensively the right than the left. 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 No middle cerebral artery abnormality is seen. Posterior circulation vessels continued to  show a normal appearance. IMPRESSION: Bilateral watershed distribution cortical and subcortical edema and restricted diffusion, more extensive on the left than the right. Differential diagnosis is true watershed infarction versus vaso regulatory disturbance secondary to arterial spasm, which is demonstrated in the anterior cerebral vessels by MR angiography. 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 Electronically Signed   By: Nelson Chimes M.D.   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 Acute onset of right arm and leg weakness. EXAM: MRI HEAD WITHOUT CONTRAST MRA HEAD WITHOUT CONTRAST TECHNIQUE: Multiplanar, multiecho pulse sequences of the brain and surrounding structures were obtained without intravenous contrast. Angiographic images of the head were obtained using MRA technique without contrast. COMPARISON:  Angiographic images same day.  MRI 03/12/2018. FINDINGS: MRI HEAD FINDINGS Brain: The brain shows bilateral watershed distribution cortical and subcortical edema and restricted diffusion, affecting both hemispheres but more extensive on the left than the right. The question is if this represents true  bilateral watershed infarction or if it represents a vaso regulatory disturbance related to spasm. No specific vascular territory infarction is seen. No sign of hemorrhage. Chronic small-vessel ischemic changes affect the brainstem and the cerebral hemispheric white matter as seen previously. No hydrocephalus. No extra-axial collection. Vascular: Major vessels at the base of the brain show flow. 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 There does appear to be a spasm appearance of the A1 vessel on the left and both anterior cerebral arteries, more extensively the right than the left. 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 No middle cerebral artery abnormality is seen. Posterior circulation vessels continued to show a normal appearance. IMPRESSION: Bilateral watershed distribution cortical and subcortical edema and restricted diffusion, more extensive on the left than the right. Differential diagnosis is true watershed infarction versus vaso regulatory disturbance secondary to arterial spasm, which is demonstrated in the anterior cerebral vessels by MR angiography. 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 Electronically Signed   By: Nelson Chimes M.D.   On: 03/23/2018 16:02   Mr Brain Wo Contrast  Result Date: 03/11/2018 CLINICAL DATA:  Headache, new, malignancy suspected. Dizziness and weakness for 5 days EXAM: MRI HEAD WITHOUT CONTRAST MRA HEAD WITHOUT CONTRAST TECHNIQUE: Multiplanar, multiecho pulse sequences of the brain and surrounding structures were obtained without intravenous contrast. Angiographic images of the head were obtained using MRA technique without contrast. COMPARISON:  None. FINDINGS: MRI HEAD FINDINGS Brain: No acute infarction, hemorrhage, hydrocephalus, extra-axial collection or  mass lesion. Mild patchy FLAIR hyperintensity in the pons. Even milder periventricular FLAIR hyperintensity. Normal brain volume. Partially empty sella considered incidental in isolation. Vascular: Arterial findings below. Normal dural venous sinus flow voids. 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 No major branch occlusion or flow limiting stenosis. Negative for vessel beading. IMPRESSION: Brain MRI: 1. No acute finding or specific explanation for headache. 2. Mild signal abnormality in the pons and periventricular white matter that is usually from chronic small vessel ischemia. Intracranial MRA: 1. 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 2. No emergent finding. Electronically Signed   By: Monte Fantasia M.D.   On: 03/11/2018 09:08   Mr Brain W Contrast  Result Date: 03/12/2018 CLINICAL DATA:  Liver cancer. Headache. Rule out metastatic disease. EXAM: MRI HEAD WITH CONTRAST TECHNIQUE: Multiplanar, multiecho pulse sequences of the brain and surrounding structures were obtained with intravenous contrast. CONTRAST:  10 mL Gadovist IV COMPARISON:  Unenhanced MRI head 03/11/2018 FINDINGS: Normal enhancement. No enhancing mass lesion. Leptomeningeal enhancement normal. No acute abnormality. See recent unenhanced MRI report. IMPRESSION: No acute abnormality and negative for intracranial metastatic disease. Electronically Signed   By: Franchot Gallo M.D.   On: 03/12/2018 11:16   US Carotid Bilateral (at [facility] And [facility] Only)  Result Date: 03/11/2018 CLINICAL DATA:  58 year old female with history of vertigo EXAM: BILATERAL CAROTID DUPLEX ULTRASOUND TECHNIQUE: Pearline Cables scale imaging, color Doppler and duplex ultrasound were performed of bilateral carotid and  vertebral arteries in the neck. COMPARISON:  None. FINDINGS: Criteria: Quantification of carotid stenosis is based on velocity parameters that correlate the residual internal carotid diameter with NASCET-based stenosis levels, using the diameter of the distal internal carotid lumen as the denominator for stenosis measurement. The following velocity measurements were obtained: RIGHT ICA:  Systolic 91 cm/sec, Diastolic 34 cm/sec CCA:  485 cm/sec SYSTOLIC ICA/CCA RATIO:  0.9 ECA:  96 cm/sec LEFT ICA:  Systolic 78 cm/sec, Diastolic 38 cm/sec CCA:  93 cm/sec SYSTOLIC ICA/CCA RATIO:  0.8 ECA:  77 cm/sec Right Brachial SBP: Not acquired Left Brachial SBP: Not acquired RIGHT CAROTID ARTERY: No significant calcified disease of the right common carotid artery. Intermediate waveform maintained. Heterogeneous plaque without significant calcifications at the right carotid bifurcation. Low resistance waveform of the right ICA. No significant tortuosity. RIGHT VERTEBRAL ARTERY: Antegrade flow with low resistance waveform. LEFT CAROTID ARTERY: No significant calcified disease of the left common carotid artery. Intermediate waveform maintained. Heterogeneous plaque at the left carotid bifurcation without significant calcifications. Low resistance waveform of the left ICA. LEFT VERTEBRAL ARTERY:  Antegrade flow with low resistance waveform. Additional: Right thyroid nodule measures 1.8 cm. IMPRESSION: Color duplex indicates minimal heterogeneous plaque, with no hemodynamically significant stenosis by duplex criteria in the extracranial cerebrovascular circulation. Incidental finding of right thyroid nodule measuring 1.8 cm. Dedicated thyroid ultrasound recommended if further characterisation warranted. Signed, Dulcy Fanny. Dellia Nims, RPVI [facility] Radiology Electronically Signed   By: Corrie Mckusick D.O.   On: 03/11/2018 09:41   Mr Jodene Nam Head/brain IO Cm  Result Date:  03/11/2018 CLINICAL DATA:  Headache, new, malignancy suspected. Dizziness and weakness for 5 days EXAM: MRI HEAD WITHOUT CONTRAST MRA HEAD WITHOUT CONTRAST TECHNIQUE: Multiplanar, multiecho pulse sequences of the brain and surrounding structures were obtained without intravenous contrast. Angiographic images of the head were obtained using MRA technique without contrast. COMPARISON:  None. FINDINGS: MRI HEAD FINDINGS Brain: No acute infarction, hemorrhage, hydrocephalus, extra-axial collection or mass lesion. Mild patchy FLAIR hyperintensity in the pons. Even milder periventricular FLAIR hyperintensity. Normal brain volume. Partially empty sella considered incidental in isolation. Vascular: Arterial findings below. Normal dural venous sinus flow voids. 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 No major branch occlusion or flow limiting stenosis. Negative for vessel beading. IMPRESSION: Brain MRI: 1.  No acute finding or specific explanation for headache. 2. Mild signal abnormality in the pons and periventricular white matter that is usually from chronic small vessel ischemia. Intracranial MRA: 1. 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 2. No emergent finding. Electronically Signed   By: Monte Fantasia M.D.   On: 03/11/2018 09:08   MRI repeat pending   PHYSICAL EXAM  Temp:  [98.6 F (37 C)-99.9 F (37.7 C)] 99.9 F (37.7 C) (01/29 0800) Pulse Rate:  [86-120] 101 (01/29 1000) Resp:  [13-25] 15 (01/29 1000) BP: (90-152)/(48-112) 98/67 (01/29 1000) SpO2:  [93 %-100 %] 100 % (01/29 1000) FiO2 (%):  [40 %] 40 % (01/29 0719)  General - Well nourished, well developed, intubated off sedation.  Ophthalmologic - fundi not visualized due to  noncooperation.  Cardiovascular - Regular rate and rhythm, tachycardia.  Neuro - intubated off sedation, eyes open, but not following commands. Eeyes mid position, inconsistently blinking to visual threat bilaterally, but blinking spontaneously, doll's eyes present, not tracking, PERRL. Corneal reflex present bilaterally, gag and cough present. Breathing over the vent.  Facial symmetry not able to test due to ET tube.  Tongue midline in mouth. On pain stimulation, mild decorticate posturing.  Increased muscle tone on left upper extremity, BLEs and in the neck, difficulty with neck flexion. Significant cog-wheeling on the LUE. DTR 1+ and positive bilateral babinski. Sensation, coordination and gait not tested.   ASSESSMENT/PLAN Rebecca Cox is a 58 y.o. female with history of CKD, CAD, OSA, liver tumor, cerebral aneurysm admitted for arm leg weakness found after aneurysm coiling. No tPA given due to right groin severe bleeding.    Stroke:  bilateral ACA and MCA/ACA watershed infarcts, embolic secondary to procedure as well as hypotension  Resultant decorticate posturing, bilateral arm and leg weakness  MRI bilateral ACA and MCA/ACA watershed scattered infarcts  MRA right ACA stenting  CT 04/02/2018 no bleeding, involving lateral ACA and MCA/ACA infarcts  MRI repeat pending  Carotid Doppler unremarkable  2D Echo EF 60 to 65%  LDL 96  HgbA1c 4.5  Heparin subcu for VTE prophylaxis  aspirin 325 mg daily and clopidogrel 75 mg daily prior to admission, now on aspirin 81 mg daily and Brilinta.  Ongoing aggressive stroke risk factor management  Therapy recommendations: Pending  Disposition: Pending  Increased muscle tone with cogwheeling - concerning for extrapyramidal involvement  Patient developed diffuse increased muscle tone in all extremities and the neck  MRI showed bilateral ACA and MCA/ACA infarcts  EEG no seizure  Resume home Wellbutrin and Effexor as well as  Topamax  Resume home Klonopin 0.5 mg twice daily  Supportive care   MRI repeat pending - concerning for extrapyramidal involvement of caudate, BG or thalamus  Hypotension   BP 80-90s if not on pressor  Now on neo with BP at 110-120  BP goal > 100  Taper off neo as able  AKI   Creatinine 1.04-1.05-1.78  On high dose IVF with neo and RL  oligouria   CCM on board  Close monitoring  ACOM aneurysm  Status post ACOM stent placement  On aspirin and Brilinta  Heparin IV discontinued  Dr.Deveshwar following  Right groin hematoma with severe anemia due to blood loss  Status post cerebral angiogram  Hemoglobin down from 12.8->6.1, received 4 units PRBC  hemoglobin 11.6  VVS on board  Status post right deep femoral artery repair - drainage patent   Close vascular check  Heparin IV discontinued  Continue aspirin  and Brilinta  Hyperlipidemia  Home meds:  Zocor 20   LDL 96, goal < 70  Now on Zocor 40  Continue statin at discharge  Other Stroke Risk Factors  Obesity, Body mass index is 34.95 kg/m.   Obstructive sleep apnea, on CPAP at home  Other Active Problems  Leukocytosis 16.1-15.6  Hospital day # 2  This patient is critically ill due to bilateral ACA, MCA/ACA territory infarcts, severe anemia requiring blood transfusion, groin hematoma with femoral artery repair, hypotension, leukocytosis and at significant risk of neurological worsening, death form recurrent stroke, hemorrhagic conversion, seizure, hypovolemic shock, sepsis. This patient's care requires constant monitoring of vital signs, hemodynamics, respiratory and cardiac monitoring, review of multiple databases, neurological assessment, discussion with family, other specialists and medical decision making of high complexity. I spent 45 minutes of neurocritical care time in the care of this patient. I had long discussion with husband at bedside, updated pt current condition, treatment plan  and potential prognosis. He expressed understanding and appreciation. I also discussed with Dr. Randal Buba, MD PhD Stroke Neurology 04/03/2018 11:05 AM    To contact Stroke Continuity provider, please refer to [email or web address] After hours, contact General Neurology

## 2018-04-03 NOTE — Progress Notes (Signed)
VASCULAR SURGERY:  Her Arletha Pili has a good seal in the right groin. JP had 40 cc of drainage last shift.  Ideally I would like to drainage less than 30 cc.  This drain can likely come out tomorrow.  [name], MD, [place] [phone] Office: [phone]

## 2018-04-03 NOTE — Progress Notes (Signed)
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 Estanislado Pandy. Patient laying in bed intubated without sedation. She demonstrates increased muscle tone of left extremities and RLE but is able to wiggle bilateral toes and left fingers. She opens eyes to voice and does not follow commands. Right groin incision c/d/i with wound vac intact.  CT head 04/03/2018: 1. 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 Mild interval increase in edema and local mass effect. No herniation. 2. No new acute intracranial abnormality identified.   Allergies: Patient has no known allergies.  Medications: Prior to Admission medications   Medication Sig Start Date End Date Taking? Authorizing Provider  aspirin 325 MG EC tablet Take 325 mg by mouth daily. " And as needed if headache continues"   Yes [provider]  B-COMPLEX-C PO Take 1 tablet by mouth daily.   Yes [provider]  buPROPion (WELLBUTRIN SR) 150 MG 12 hr tablet Take 150 mg by mouth 2 (two) times daily.   Yes [provider]  celecoxib (CELEBREX) 200 MG capsule Take 200 mg by mouth daily. 5pm   Yes [provider]  clonazePAM (KLONOPIN) 0.5 MG tablet Take 0.5 mg by mouth 2 (two) times daily. 1 tablet in the morning and take 1 tablet at 5p. May take additional half-whole tablet throughout the day as needed for anxiety   Yes [provider]  clopidogrel (PLAVIX) 75 MG tablet Take 75 mg by mouth daily.   Yes [provider]  metoprolol succinate (TOPROL-XL) 25 MG 24 hr tablet Take 12.5 mg by mouth every morning.   Yes [provider]  pantoprazole (PROTONIX) 40 MG tablet Take 40 mg by mouth every morning.   Yes [provider]  polycarbophil (FIBERCON) 625 MG tablet Take 625  mg by mouth 2 (two) times daily.   Yes [provider]  simvastatin (ZOCOR) 20 MG tablet Take 20 mg by mouth every morning.   Yes [provider]  topiramate (TOPAMAX) 25 MG tablet Take 25 mg by mouth daily. 25mg  at night for one week then 50 mg at night thereafter   Yes [provider]  venlafaxine XR (EFFEXOR-XR) 150 MG 24 hr capsule Take 150 mg by mouth 2 (two) times daily.   Yes [provider]     Vital Signs: BP (!) 99/59   Pulse (!) 114   Temp 99.9 F (37.7 C) (Axillary)   Resp 15   Ht 5\' 5"  (1.651 m)   Wt 210 lb (95.3 kg)   SpO2 100%   BMI 34.95 kg/m   Physical Exam Constitutional:      General: She is not in acute distress.    Appearance: Normal appearance.     Comments: Intubated.  Pulmonary:     Effort: Pulmonary effort is normal. No respiratory distress.     Comments: Intubated. Skin:    General: Skin is warm and dry.  Neurological:     Mental Status: She is alert.     Comments: Intubated without sedation. She opens eyes to voice and does not follow commands. PERRL bilaterally. EOMs not assessed. Visual fields not assessed. Unable to assess facial asymmetry due to ET tube. Unable to assess tongue protrusion due to ET tube. She demonstrates increased muscle tone of left extremities and RLE but is able to wiggle bilateral toes  and left fingers. Pronator drift not assessed. Fine motor and coordination not assessed. Gait not assessed. Romberg not assessed. Heel to toe not assessed. Distal pulses palpable bilaterally with Doppler.  Psychiatric:     Comments: Intubated.     Imaging: Ct Head Wo Contrast  Result Date: 04/03/2018 CLINICAL DATA:  58 y/o  F; follow-up of stroke. EXAM: CT HEAD WITHOUT CONTRAST TECHNIQUE: Contiguous axial images were obtained from the base of the skull through the vertex without intravenous contrast. COMPARISON:  04/02/2018 CT head. FINDINGS: Brain: Stable distribution of bilateral watershed  distribution infarcts. Mild interval increase in edema and local mass effect. No herniation. No new stroke, hemorrhage, extra-axial collection, or hydrocephalus. Empty sella turcica. Vascular: ACA stent noted. Calcific atherosclerosis of carotid siphons. No new hyperdense vessel. Skull: Normal. Negative for fracture or focal lesion. Sinuses/Orbits: No acute finding. Other: None. IMPRESSION: 1. 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 Mild interval increase in edema and local mass effect. No herniation. 2. No new acute intracranial abnormality identified. Electronically Signed   By: Kristine Garbe M.D.   On: 04/03/2018 03:22   Ct Head Wo Contrast  Result Date: 04/02/2018 CLINICAL DATA:  Stroke, follow-up. EXAM: CT HEAD WITHOUT CONTRAST TECHNIQUE: Contiguous axial images were obtained from the base of the skull through the vertex without intravenous contrast. COMPARISON:  MRI brain 03/13/2018. FINDINGS: Brain: Bilateral watershed territory infarcts are confirmed as hypoattenuation involving the cortex, left greater than right. There is no associated hemorrhage. Left ACA stent is stable in position. There is no hemorrhage or new infarct. Ventricles are of normal size. Insert pass fluid Vascular: ACA stent is in place. No significant atherosclerotic calcifications are present. There is no hyperdense vessel. Skull: Calvarium is intact. No focal lytic or blastic lesions are present. Sinuses/Orbits: The paranasal sinuses and mastoid air cells are clear. The globes and orbits are within normal limits. IMPRESSION: 1. Expected progression of bilateral watershed territory infarcts, left greater than right. 2. No new infarct territory or hemorrhage. 3. ACA stent. Electronically Signed   By: San Morelle M.D.   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  Acute onset of right arm and leg weakness. EXAM: MRI HEAD WITHOUT CONTRAST MRA HEAD WITHOUT CONTRAST TECHNIQUE: Multiplanar, multiecho pulse sequences of the brain and surrounding structures were obtained without intravenous contrast. Angiographic images of the head were obtained using MRA technique without contrast. COMPARISON:  Angiographic images same day.  MRI 03/12/2018. FINDINGS: MRI HEAD FINDINGS Brain: The brain shows bilateral watershed distribution cortical and subcortical edema and restricted diffusion, affecting both hemispheres but more extensive on the left than the right. The question is if this represents true bilateral watershed infarction or if it represents a vaso regulatory disturbance related to spasm. No specific vascular territory infarction is seen. No sign of hemorrhage. Chronic small-vessel ischemic changes affect the brainstem and the cerebral hemispheric white matter as seen previously. No hydrocephalus. No extra-axial collection. Vascular: Major vessels at the base of the brain show flow. 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 There does appear to be a spasm appearance of the A1 vessel on the left and both anterior cerebral arteries, more extensively the right than the left. 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 No middle cerebral  artery abnormality is seen. Posterior circulation vessels continued to show a normal appearance. IMPRESSION: Bilateral watershed distribution cortical and subcortical edema and restricted diffusion, more extensive on the left than the right. Differential diagnosis is true watershed infarction versus vaso regulatory disturbance secondary to arterial spasm, which is demonstrated in the anterior cerebral vessels by MR angiography. Previously seen 5 mm anterior communicating region  aneurysm remains visible. Electronically Signed   By: Nelson Chimes M.D.   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 Acute onset of right arm and leg weakness. EXAM: MRI HEAD WITHOUT CONTRAST MRA HEAD WITHOUT CONTRAST TECHNIQUE: Multiplanar, multiecho pulse sequences of the brain and surrounding structures were obtained without intravenous contrast. Angiographic images of the head were obtained using MRA technique without contrast. COMPARISON:  Angiographic images same day.  MRI 03/12/2018. FINDINGS: MRI HEAD FINDINGS Brain: The brain shows bilateral watershed distribution cortical and subcortical edema and restricted diffusion, affecting both hemispheres but more extensive on the left than the right. The question is if this represents true bilateral watershed infarction or if it represents a vaso regulatory disturbance related to spasm. No specific vascular territory infarction is seen. No sign of hemorrhage. Chronic small-vessel ischemic changes affect the brainstem and the cerebral hemispheric white matter as seen previously. No hydrocephalus. No extra-axial collection. Vascular: Major vessels at the base of the brain show flow. 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 There does appear to be a spasm appearance of the A1 vessel on the left and both anterior cerebral arteries, more extensively the right than the left. 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 No middle cerebral artery abnormality is seen. Posterior circulation vessels continued to show a normal appearance. IMPRESSION: Bilateral watershed distribution cortical and subcortical edema and restricted diffusion, more extensive on the left than the right.  Differential diagnosis is true watershed infarction versus vaso regulatory disturbance secondary to arterial spasm, which is demonstrated in the anterior cerebral vessels by MR angiography. 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 Electronically Signed   By: Nelson Chimes M.D.   On: 04/05/2018 16:02   Dg Chest Port 1 View  Result Date: 04/02/2018 CLINICAL DATA:  Endotracheal tube. Dizziness. Weakness. Liver cancer. EXAM: PORTABLE CHEST 1 VIEW COMPARISON:  03/10/2018 FINDINGS: Stable endotracheal tube. NG tube placed. Tip is beyond the gastroesophageal junction. Normal heart size. Clear lungs. No pneumothorax. IMPRESSION: NG tube placed beyond the GE junction. Lungs remain clear. Electronically Signed   By: Marybelle Killings M.D.   On: 04/02/2018 07:58   Dg Chest Port 1 View  Result Date: 04/03/2018 CLINICAL DATA:  Status post intubation. EXAM: PORTABLE CHEST 1 VIEW COMPARISON:  None. FINDINGS: ET tube is in place with the tip approximately 1.7 cm above the carina. Lungs are clear. No pneumothorax or pleural effusion. Heart size is normal. No acute or focal bony abnormality. IMPRESSION: ETT tip is 1.7 cm above the carina. Lungs are clear. Electronically Signed   By: Inge Rise M.D.   On: 03/06/2018 16:08   Dg Abd Portable 1v  Result Date: 03/17/2018 CLINICAL DATA:  Feeding tube placement. EXAM: PORTABLE ABDOMEN - 1 VIEW COMPARISON:  None. FINDINGS: Enteric tube tip in the gastric antrum, likely near the pylorus. Nonobstructive bowel gas pattern.  Visualized lungs are clear. IMPRESSION: Enteric tube tip in the distal stomach, likely near the pylorus. Electronically Signed   By: Titus Dubin M.D.   On: 03/22/2018 22:25    Labs:  CBC: Recent Labs    03/06/2018 0653  03/29/2018 1320 04/04/2018 1608 03/27/2018 2022 04/02/18 0449  WBC 7.3  --   --  8.3 16.1* 15.6*  HGB 12.8   < > 7.1* 11.2* 12.5 11.6*  HCT 42.9   < > 21.0* 33.6* 38.4 34.7*  PLT 212  --   --  126*  159 152   < > = values in this interval not displayed.    COAGS: Recent Labs    03/10/18 1058 03/21/2018 0653 03/29/2018 1608  INR 0.97 1.03 1.24  APTT 25  --  26    BMP: Recent Labs    03/29/2018 0653 03/31/2018 1141  03/22/2018 1320 03/19/2018 1608 04/02/18 0449 04/03/18 0724  NA 141 143   < > 142 136 139 141  K 4.1 4.2   < > 4.5 4.4 4.6 4.2  CL 111  --   --   --  111 115* 118*  CO2 21*  --   --   --  17* 18* 19*  GLUCOSE 83 125*  --   --  165* 127* 138*  BUN 16  --   --   --  13 15 32*  CALCIUM 9.8  --   --   --  9.3 8.9 8.5*  CREATININE 1.30*  --   --   --  1.04* 1.05* 1.78*  GFRNONAA 46*  --   --   --  >60 60* 32*  GFRAA 53*  --   --   --  >60 >60 37*   < > = values in this interval not displayed.    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 Estanislado Pandy. Patient's condition stable- remains intubated without sedation, opens eyes to voice but does not follow commands, demonstrates increased muscle tone of left extremities and RLE but is able to wiggle bilateral toes and left fingers. Right groin incision stable- plans per vascular surgery. Continue taking Brilinta 90 mg twice daily and Aspirin 81 mg once daily. Appreciate and agree with neurology, CCM, and vascular surgery management. IR to follow.   Electronically Signed: Earley Abide, PA-C 04/03/2018, 9:26 AM   I spent a total of 25 Minutes at the the patient's bedside AND on the patient's hospital floor or unit, greater than 50% of which was counseling/coordinating care for [facility] aneurysm s/p embolization.

## 2018-04-03 NOTE — Progress Notes (Addendum)
Initial Nutrition Assessment  DOCUMENTATION CODES:   Obesity unspecified  INTERVENTION:   Increase Vital High Protein to 45 ml/hr via OG tube Continue 30 ml Prostat BID Add MVI BID for hx of gastric sleeve  Provides: 1280 kcal, 124 grams protein, and 902 ml free water.    NUTRITION DIAGNOSIS:   Inadequate oral intake related to inability to eat as evidenced by NPO status.  GOAL:   Provide needs based on ASPEN/SCCM guidelines  MONITOR:   TF tolerance, I & O's  REASON FOR ASSESSMENT:   Consult, Ventilator Enteral/tube feeding initiation and management  ASSESSMENT:   Pt with PMH of agoraphobia, liver cancer followed by [facility], CKD stage 3, GERD, gastric sleeve surgery who was admitted with anterior communicating artery aneurysm s/p coiling, during procedure pt developed bifrontal CVA and R groin hematoma with return to OR for evacuation of hematoma and repair of vessel.    Pt discussed during ICU rounds and with RN.  Requiring high rates of neo.  No family present at this time. Repeat MR pending. Per IR MD hx pt with hx of gastric sleeve.   Patient is currently intubated on ventilator support MV: 8.6 L/min Temp (24hrs), Avg:99.3 F (37.4 C), Min:98.6 F (37 C), Max:99.9 F (37.7 C)  Medications reviewed and include:  Neosynephrine @ 100 mcg Labs reviewed: magnesium: 1.6 (L)   BP: 106/66 MAP: 77   I/O: +10,466 ml since admit UOP: 315 ml x 24 hrs Drains: 70 ml  TF: Vital High Protein @ 40 ml/hr with 30 ml Prostat BID Provides: 1160 kcal and 114 grams protein  NUTRITION - FOCUSED PHYSICAL EXAM:    Most Recent Value  Orbital Region  No depletion  Upper Arm Region  No depletion  Thoracic and Lumbar Region  No depletion  Buccal Region  Unable to assess  Temple Region  No depletion  Clavicle Bone Region  No depletion  Clavicle and Acromion Bone Region  No depletion  Scapular Bone Region  Unable to assess  Dorsal Hand  No depletion  Patellar Region  No  depletion  Anterior Thigh Region  No depletion  Posterior Calf Region  No depletion  Edema (RD Assessment)  Mild  Hair  Reviewed  Eyes  Unable to assess  Mouth  Unable to assess  Skin  -- [bruising from neuro assessements ]  Nails  Reviewed       Diet Order:   Diet Order            Diet NPO time specified  Diet effective now              EDUCATION NEEDS:   No education needs have been identified at this time  Skin:  Skin Assessment: Reviewed RN Assessment  Last BM:  unknown  Height:   Ht Readings from Last 1 Encounters:  03/31/2018 5\' 5"  (1.651 m)    Weight:   Wt Readings from Last 1 Encounters:  03/23/2018 95.3 kg    Ideal Body Weight:  56.8 kg  BMI:  Body mass index is 34.95 kg/m.  Estimated Nutritional Needs:   Kcal:  1100-1350  Protein:  >113 grams  Fluid:  > 1.5 L/day  Maylon Peppers RD, LDN, CNSC [phone] Pager [phone] After Hours Pager

## 2018-04-03 NOTE — Progress Notes (Signed)
[facility] Progress Note Patient Name: Rebecca Cox DOB: 1960/04/29 MRN: [id number]   Date of Service  04/03/2018  HPI/Events of Note  Oliguria  eICU Interventions  500 ml iv Saline bolus        [place]oron[place]wo U Ogan 04/03/2018, 1:51 AM

## 2018-04-04 ENCOUNTER — Inpatient Hospital Stay (HOSPITAL_COMMUNITY): Payer: Medicare Other

## 2018-04-04 DIAGNOSIS — I639 Cerebral infarction, unspecified: Secondary | ICD-10-CM

## 2018-04-04 DIAGNOSIS — R403 Persistent vegetative state: Secondary | ICD-10-CM

## 2018-04-04 LAB — PHOSPHORUS
Phosphorus: 1.8 mg/dL — ABNORMAL LOW (ref 2.5–4.6)
Phosphorus: 1.6 mg/dL — ABNORMAL LOW (ref 2.5–4.6)

## 2018-04-04 LAB — MAGNESIUM
Magnesium: 1.9 mg/dL (ref 1.7–2.4)
Magnesium: 1.7 mg/dL (ref 1.7–2.4)

## 2018-04-04 LAB — GLUCOSE, CAPILLARY
Glucose-Capillary: 130 mg/dL — ABNORMAL HIGH (ref 70–99)
Glucose-Capillary: 146 mg/dL — ABNORMAL HIGH (ref 70–99)
Glucose-Capillary: 131 mg/dL — ABNORMAL HIGH (ref 70–99)
Glucose-Capillary: 143 mg/dL — ABNORMAL HIGH (ref 70–99)
Glucose-Capillary: 164 mg/dL — ABNORMAL HIGH (ref 70–99)
Glucose-Capillary: 141 mg/dL — ABNORMAL HIGH (ref 70–99)

## 2018-04-04 LAB — CBC
HCT: 26.5 % — ABNORMAL LOW (ref 36.0–46.0)
HEMOGLOBIN: 8.4 g/dL — AB (ref 12.0–15.0)
MCH: 28.4 pg (ref 26.0–34.0)
MCHC: 31.7 g/dL (ref 30.0–36.0)
MCV: 89.5 fL (ref 80.0–100.0)
Platelets: 80 10*3/uL — ABNORMAL LOW (ref 150–400)
RBC: 2.96 MIL/uL — ABNORMAL LOW (ref 3.87–5.11)
RDW: 14.6 % (ref 11.5–15.5)
WBC: 7.5 10*3/uL (ref 4.0–10.5)
nRBC: 0 % (ref 0.0–0.2)

## 2018-04-04 LAB — BASIC METABOLIC PANEL
ANION GAP: 5 (ref 5–15)
BUN: 28 mg/dL — ABNORMAL HIGH (ref 6–20)
CO2: 17 mmol/L — ABNORMAL LOW (ref 22–32)
Calcium: 8.6 mg/dL — ABNORMAL LOW (ref 8.9–10.3)
Chloride: 117 mmol/L — ABNORMAL HIGH (ref 98–111)
Creatinine, Ser: 0.97 mg/dL (ref 0.44–1.00)
GFR calc Af Amer: >60 mL/min (ref >60)
GFR calc non Af Amer: >60 mL/min (ref >60)
Glucose, Bld: 156 mg/dL — ABNORMAL HIGH (ref 70–99)
Potassium: 3.3 mmol/L — ABNORMAL LOW (ref 3.5–5.1)
SODIUM: 139 mmol/L (ref 135–145)

## 2018-04-04 LAB — ECHOCARDIOGRAM COMPLETE
Height: 65 in
Weight: 3964.75 oz

## 2018-04-04 MED ORDER — SODIUM CHLORIDE 0.9% FLUSH: 10.0000 mL | INTRAVENOUS | Status: DC | PRN

## 2018-04-04 MED ORDER — DEXMEDETOMIDINE HCL IN NACL 400 MCG/100ML IV SOLN
0.4000 ug/kg/h | INTRAVENOUS | Status: DC
  Administered 2018-04-04 – 2018-04-05 (×7): 1.2 ug/kg/h via INTRAVENOUS
  Administered 2018-04-05: 1.1 ug/kg/h via INTRAVENOUS
  Administered 2018-04-05 (×4): 1.2 ug/kg/h via INTRAVENOUS
  Administered 2018-04-06 (×2): 1 ug/kg/h via INTRAVENOUS
  Administered 2018-04-06: 1.2 ug/kg/h via INTRAVENOUS
  Administered 2018-04-06: 1.1 ug/kg/h via INTRAVENOUS
  Administered 2018-04-06 – 2018-04-07 (×10): 1.2 ug/kg/h via INTRAVENOUS
  Administered 2018-04-08 (×2): 0.4 ug/kg/h via INTRAVENOUS
  Administered 2018-04-08: 1.2 ug/kg/h via INTRAVENOUS
  Administered 2018-04-08 – 2018-04-09 (×2): 0.6 ug/kg/h via INTRAVENOUS
  Administered 2018-04-09 (×2): 0.4 ug/kg/h via INTRAVENOUS
  Administered 2018-04-10 (×2): 0.8 ug/kg/h via INTRAVENOUS
  Administered 2018-04-10: 0.6 ug/kg/h via INTRAVENOUS
  Filled 2018-04-04 (×3): qty 100
  Filled 2018-04-04: qty 200
  Filled 2018-04-04 (×8): qty 100
  Filled 2018-04-04: qty 200
  Filled 2018-04-04 (×17): qty 100
  Filled 2018-04-04 (×2): qty 200
  Filled 2018-04-04 (×2): qty 100

## 2018-04-04 MED ORDER — POTASSIUM PHOSPHATE MONOBASIC 500 MG PO TABS
500.0000 mg | ORAL_TABLET | Freq: Once | ORAL | Status: DC
  Filled 2018-04-04: qty 1

## 2018-04-04 MED ORDER — SODIUM CHLORIDE 0.9% FLUSH
10.0000 mL | Freq: Two times a day (BID) | INTRAVENOUS | Status: DC
  Administered 2018-04-04 – 2018-04-06 (×5): 10 mL
  Administered 2018-04-06: 20 mL
  Administered 2018-04-07 – 2018-04-10 (×6): 10 mL

## 2018-04-04 MED ORDER — CHLORHEXIDINE GLUCONATE CLOTH 2 % EX PADS
6.0000 | MEDICATED_PAD | Freq: Every day | CUTANEOUS | Status: DC
  Administered 2018-04-05 – 2018-04-09 (×6): 6 via TOPICAL

## 2018-04-04 MED ORDER — MIDAZOLAM HCL 2 MG/2ML IJ SOLN
1.0000 mg | INTRAMUSCULAR | Status: AC | PRN
  Administered 2018-04-04 (×3): 1 mg via INTRAVENOUS
  Filled 2018-04-04 (×3): qty 2

## 2018-04-04 MED ORDER — K PHOS MONO-SOD PHOS DI & MONO 155-852-130 MG PO TABS
500.0000 mg | ORAL_TABLET | Freq: Once | ORAL | Status: AC
  Administered 2018-04-04: 500 mg
  Filled 2018-04-04 (×2): qty 2

## 2018-04-04 MED ORDER — K PHOS MONO-SOD PHOS DI & MONO 155-852-130 MG PO TABS
500.0000 mg | ORAL_TABLET | Freq: Once | ORAL | Status: DC
  Filled 2018-04-04: qty 2

## 2018-04-04 MED ORDER — POTASSIUM PHOSPHATE MONOBASIC 500 MG PO TABS: 500.0000 mg | ORAL_TABLET | Freq: Once | ORAL | Status: DC

## 2018-04-04 MED ORDER — SODIUM PHOSPHATES 45 MMOLE/15ML IV SOLN
20.0000 mmol | Freq: Once | INTRAVENOUS | Status: AC
  Administered 2018-04-04: 20 mmol via INTRAVENOUS
  Filled 2018-04-04: qty 6.67

## 2018-04-04 NOTE — Progress Notes (Signed)
Patient remains intubated and sedated.  JP drain output has slowed over the past 48 hours.  JP drain was removed today.  Continue prevena incisional wound vac R groin.  She is perfusing her R foot well with a palpable DP pulse.  Dagoberto Ligas, PA-C [facility] [phone] 04/04/2018  12:10 PM

## 2018-04-04 NOTE — Progress Notes (Signed)
NAME:  Rebecca Cox, MRN:  [id number], DOB:  [birth year], LOS: 3 ADMISSION DATE:  03/22/2018, CONSULTATION DATE: 03/23/2018 REFERRING MD:  Patrecia Pour, CHIEF COMPLAINT: Acute respiratory failure  Brief History   This is a 58 year old female with history of liver cancer and cerebral aneurysm who presents to PCCM for vent management postop.  Patient have the aneurysm coiled in during the procedure developed a bifrontal CVA and a right groin hematoma.  She returned to the OR for evacuation of the hematoma and repair of the vessel.  Heparin was reversed and patient was transfused 2 units of packed red blood cells.  Past Medical History   Past Medical History:  Diagnosis Date  . Agoraphobia   . Arthritis   . Cancer ([place])    liver  . Cerebral aneurysm   . Chronic kidney disease    stage 3  . Coronary artery disease   . Depression   . Diverticulitis   . Esophageal hernia   . Family history of adverse reaction to anesthesia    Pt suspects that " mother had cardiac arrest from anesthesia- not exactly sure"   . GERD (gastroesophageal reflux disease)   . Headache   . Sleep apnea    does not wear CPAP  . Thyroid nodule   . Wears glasses   . Wears partial dentures    Significant Hospital Events   1/27 > anterior communicating aneurysm embolization, post procedure code stroke, right femoral hematoma  Consults:  PCCM Neurology Vascular Surgery  Procedures:  1/27 >  anterior communicating aneurysm embolization 1/27 > ETT 1/27 R aline >> 1/28  Significant Diagnostic Tests:  MR I 03/18/2018 >>  Bilateral watershed distribution cortical and subcortical edema and restricted diffusion, more extensive on the left than the right.  Differential diagnosis is true watershed infarction versus vaso regulatory disturbance secondary to arterial spasm, which is demonstrated in the anterior cerebral vessels by MR angiography. Previously seen 5 mm anterior communicating region aneurysm remains  visible.  1/28 CTH >> 1. Expected progression of bilateral watershed territory infarcts, left greater than right. 2. No new infarct territory or hemorrhage. 3. ACA stent.  1/28 EEG> abnormal EEG, generalized background slowing. No epileptiform activity.   1/29 CT Head>  Stable watershed distribution bilaterally, new acute intracranial abnormality   1/29 MRI brain> worsening watershed infarcts compared to 1/27 MRI. No mass effect, no hemorrhage.   Micro Data:  1/27 MRSA PCR >>neg  Antimicrobials:   1/27 cefazolin preop  Interim history/subjective:  Increased UOP with addition of IVF yesterday. Neo weaning. Husband is thinking about possible trach for patient.   Objective   Blood pressure 98/65, pulse 88, temperature 98.9 F (37.2 C), temperature source Axillary, resp. rate 15, height 5\' 5"  (1.651 m), weight 112.4 kg, SpO2 99 %.    Vent Mode: PSV;CPAP FiO2 (%):  [40 %] 40 % Set Rate:  [16 bmp] 16 bmp Vt Set:  [450 mL] 450 mL PEEP:  [5 cmH20] 5 cmH20 Pressure Support:  [5 cmH20] 5 cmH20 Plateau Pressure:  [10 cmH20-15 cmH20] 10 cmH20   Intake/Output Summary (Last 24 hours) at 04/04/2018 1024 Last data filed at 04/04/2018 0900 Gross per 24 hour  Intake 4717.71 ml  Output 1370 ml  Net 3347.71 ml   Filed Weights   03/22/2018 0621 04/04/18 0200  Weight: 95.3 kg 112.4 kg    Examination: General:  Obese adult female, intubated sedated, NAD  HEENT: NCAT. ETT OGT secure. Pink, moist mucus membranes. Anicteric sclera. Neuro: Sedated,  opens eyes to noxious stimuli. Does not follow commands. PERRL 43mm  CV: RRR no r/g/m. 2+ radial pulses, 1+ pedal pulses PULM: Mechanically ventilated. Scant thick tan secretions. Scattered rhonchi, improved some with suction  GI: obese, soft, round. Hypoactive x4 Extremities: Scattered ecchymosis BLE. No appreciable edema BLE.  Skin: pale, clean, dry, intact.   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 Estanislado Pandy Continue serial neuro exam SBP goal 120-140, titrate neo for goal   Acute respiratory insufficiency requiring mechanical ventilation -post operative insufficiency, contributing factors include CVA, sedation P:  Continue PRVC Titrate PEEP/FiO2 for goal SpO2>94% Fentanyl gtt changed to Precedex, with PRN fentanyl and PRN  versed  Deep suction/ pulm hygiene  Trach brought up by Dr. Erlinda Hong; husband still considering  Adding CPT q4  Right groin hematoma, s/p repair  -associated acute blood loss anemia, s/p [facility] 1/27 P:  Vascular surgery following, managing.  JP drain removed 1/30 Monitor site for bleeding, hematoma Trend H/H   Acute Kidney Injury  -Cr to 1.78 with oliguria 1/29 P Follow up BMP 1/30 (pending) UOP has increased to approx 65 ml/hr after Continue strict I/O, on mIVF 50/hr  Maintain SBP goal 120-140  Electrolyte abnormality -Hypomagnesemia -Hypophosphatemia    P:  -AM BMP pending, Mg/Phos resulted -Replaced Phos, follow up PM mag phos and replace PRN   Diabetes Mellitus  P:  Continue q4 CBG No SSI at this time, start SSI if > 180  Best practice:  Diet: Enteral nutrition Pain/Anxiety/Delirium protocol (if indicated): Precedex gtt, PRN fentanyl, PRN versed  VAP protocol (if indicated): Yes DVT prophylaxis:  SCD GI prophylaxis: protonix Glucose control: q4CBG Mobility: Bedrest Code Status: Full  Family Communication: Husband at bedside, updated on respiratory status, electrolyte abnormalities, UOP. He states he still thinking about if pt would want trach.  Disposition:  Continue ICU level of care   Labs   CBC:   Recent Labs  Lab 03/28/2018 0653  04/02/2018 1608 03/21/2018 2022 04/02/18 0449 04/03/18 0730 04/04/18 0432  WBC 7.3  --  8.3 16.1* 15.6* 19.0* 7.5  NEUTROABS 4.9  --   --   --  13.1*  --   --   HGB 12.8   < > 11.2* 12.5 11.6* 9.9* 8.4*  HCT 42.9   < > 33.6* 38.4 34.7* 32.2* 26.5*  MCV 91.7  --  88.0 86.7 87.0 93.3 89.5  PLT 212  --  126* 159 152 115* 80*   < > = values in this interval not displayed.    Basic Metabolic Panel: Recent Labs  Lab 03/21/2018 0653 03/20/2018 1141 03/25/2018 1214 03/16/2018 1320 03/13/2018 1608  04/02/18 0449 04/02/18 1236 04/02/18 1648 04/03/18 0724 04/03/18 1637 04/04/18 0432  NA 141 143 144 142 136  --  139  --   --  141  --   --   K 4.1 4.2 3.8 4.5 4.4  --  4.6  --   --  4.2  --   --   CL 111  --   --   --  111  --  115*  --   --  118*  --   --  CO2 21*  --   --   --  17*  --  18*  --   --  19*  --   --   GLUCOSE 83 125*  --   --  165*  --  127*  --   --  138*  --   --   BUN 16  --   --   --  13  --  15  --   --  32*  --   --   CREATININE 1.30*  --   --   --  1.04*  --  1.05*  --   --  1.78*  --   --   CALCIUM 9.8  --   --   --  9.3  --  8.9  --   --  8.5*  --   --   MG  --   --   --   --   --    < > 1.6* 1.4* 1.5* 1.6* 1.5* 1.9  PHOS  --   --   --   --   --    < > 4.1 3.8 3.9 3.2 1.9* 1.8*   < > = values in this interval not displayed.   GFR: Estimated Creatinine Clearance: 44.6 mL/min (A) (by C-G formula based on SCr of 1.78 mg/dL (H)). Recent Labs  Lab 03/20/2018 2022 04/02/18 0449 04/03/18 0730 04/04/18 0432  WBC 16.1* 15.6* 19.0* 7.5    Liver Function Tests: No results for input(s): AST, ALT, ALKPHOS, BILITOT, PROT, ALBUMIN in the last 168 hours. No results for input(s): LIPASE, AMYLASE in the last 168 hours. No results for input(s): AMMONIA in the last 168 hours.  ABG    Component Value Date/Time   PHART 7.318 (L) 04/02/2018 0413   PCO2ART 37.9 04/02/2018 0413   PO2ART 166 (H) 04/02/2018 0413   HCO3 18.9 (L) 04/02/2018 0413    TCO2 20 (L) 04/05/2018 1320   ACIDBASEDEF 6.1 (H) 04/02/2018 0413   O2SAT 98.6 04/02/2018 0413     Coagulation Profile: Recent Labs  Lab 03/30/2018 0653 03/16/2018 1608  INR 1.03 1.24    Cardiac Enzymes: Recent Labs  Lab 04/02/18 1236  CKTOTAL 103    HbA1C: Hgb A1c MFr Bld  Date/Time Value Ref Range Status  03/11/2018 06:22 AM 4.7 (L) 4.8 - 5.6 % Final    Comment:    (NOTE) Pre diabetes:          5.7%-6.4% Diabetes:              >6.4% Glycemic control for   <7.0% adults with diabetes     CBG: Recent Labs  Lab 04/03/18 1536 04/03/18 1941 04/03/18 2329 04/04/18 0310 04/04/18 0731  GLUCAP 117* 113* 143* 130* 146*   Critical Care Time: 40 minutes    Eliseo Gum MSN, AGACNP-BC [place] 04/04/2018, 10:24 AM

## 2018-04-04 NOTE — Progress Notes (Signed)
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 Estanislado Pandy.  Subjective:  Intubated, ventilated, sedated. No family at bedside this AM. Per RN moving right toe spontaneously, bites tube and seems to respond to voice but not specifically to her name or command. Attempted wean this morning however no respiratory effort was noted and patient placed back on ventilator.  Allergies: Patient has no known allergies.  Medications: Prior to Admission medications   Medication Sig Start Date End Date Taking? Authorizing Provider  aspirin 325 MG EC tablet Take 325 mg by mouth daily. " And as needed if headache continues"   Yes [provider]  B-COMPLEX-C PO Take 1 tablet by mouth daily.   Yes [provider]  buPROPion (WELLBUTRIN SR) 150 MG 12 hr tablet Take 150 mg by mouth 2 (two) times daily.   Yes [provider]  celecoxib (CELEBREX) 200 MG capsule Take 200 mg by mouth daily. 5pm   Yes [provider]  clonazePAM (KLONOPIN) 0.5 MG tablet Take 0.5 mg by mouth 2 (two) times daily. 1 tablet in the morning and take 1 tablet at 5p. May take additional half-whole tablet throughout the day as needed for anxiety   Yes [provider]  clopidogrel (PLAVIX) 75 MG tablet Take 75 mg by mouth daily.   Yes [provider]  metoprolol succinate (TOPROL-XL) 25 MG 24 hr tablet Take 12.5 mg by mouth every morning.   Yes [provider]  pantoprazole (PROTONIX) 40 MG tablet Take 40 mg by mouth every morning.   Yes [provider]  polycarbophil (FIBERCON) 625 MG tablet Take 625 mg by mouth 2 (two) times daily.   Yes [provider]  simvastatin (ZOCOR) 20 MG tablet Take 20 mg by mouth every morning.   Yes [provider]  topiramate (TOPAMAX) 25 MG tablet  Take 25 mg by mouth daily. 14m at night for one week then 50 mg at night thereafter   Yes [provider]  venlafaxine XR (EFFEXOR-XR) 150 MG 24 hr capsule Take 150 mg by mouth 2 (two) times daily.   Yes [provider]     Vital Signs: BP 115/72   Pulse 84   Temp 99 F (37.2 C) (Axillary)   Resp (!) 25   Ht _0  (1.651 m)   Wt 247 lb 12.8 oz (112.4 kg)   SpO2 100%   BMI 41.24 kg/m   Physical Exam Constitutional:      Comments: Sedated, intubated, ventilated. No family at bedside.  HENT:     Head: Normocephalic.  Cardiovascular:     Rate and Rhythm: Normal rate.  Pulmonary:     Comments: Ventilated Musculoskeletal:        General: Swelling present.  Skin:    General: Skin is warm and dry.     Findings: Bruising (Bilateral lower extremities) present.   Opens eyes to voice, does not follow commands, does not track examiner. Speech and comprehension - unable to assess PERRL bilaterally. Does not blink to interrogation on left, (+) blink to right interrogation EOMs - unable to assess Visual fields - unable to assess No obvious facial asymmetry - ETT present obscuring complete exam. Tongue midline - unable to assess; ETT present. Motor power - unable to move bilateral upper or lower extremities on command. Increased muscle tone bilateral upper and  lower extremities. Pronator drift - not assessed. Fine motor and coordination - unable to assess Gait - unable to assessed. Romberg - not assessed Heel to toe - not assesed Distal pulses - dopplerable bilaterally.    Imaging: Ct Head Wo Contrast  Result Date: 04/03/2018 CLINICAL DATA:  58 y/o  F; follow-up of stroke. EXAM: CT HEAD WITHOUT CONTRAST TECHNIQUE: Contiguous axial images were obtained from the base of the skull through the vertex without intravenous contrast. COMPARISON:  04/02/2018 CT head. 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 Mild interval increase  in edema and local mass effect. No herniation. No new stroke, hemorrhage, extra-axial collection, or hydrocephalus. Empty sella turcica. Vascular: ACA stent noted. Calcific atherosclerosis of carotid siphons. No new hyperdense vessel. Skull: Normal. Negative for fracture or focal lesion. Sinuses/Orbits: No acute finding. Other: None. IMPRESSION: 1. 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 Mild interval increase in edema and local mass effect. No herniation. 2. No new acute intracranial abnormality identified. Electronically Signed   By: Kristine Garbe M.D.   On: 04/03/2018 03:22   Ct Head Wo Contrast  Result Date: 04/02/2018 CLINICAL DATA:  Stroke, follow-up. EXAM: CT HEAD WITHOUT CONTRAST TECHNIQUE: Contiguous axial images were obtained from the base of the skull through the vertex without intravenous contrast. COMPARISON:  MRI brain 03/12/2018. FINDINGS: Brain: Bilateral watershed territory infarcts are confirmed as hypoattenuation involving the cortex, left greater than right. There is no associated hemorrhage. Left ACA stent is stable in position. There is no hemorrhage or new infarct. Ventricles are of normal size. Insert pass fluid Vascular: ACA stent is in place. No significant atherosclerotic calcifications are present. There is no hyperdense vessel. Skull: Calvarium is intact. No focal lytic or blastic lesions are present. Sinuses/Orbits: The paranasal sinuses and mastoid air cells are clear. The globes and orbits are within normal limits. IMPRESSION: 1. Expected progression of bilateral watershed territory infarcts, left greater than right. 2. No new infarct territory or hemorrhage. 3. ACA stent. Electronically Signed   By: San Morelle M.D.   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 Acute onset of right arm and leg weakness. EXAM:  MRI HEAD WITHOUT CONTRAST MRA HEAD WITHOUT CONTRAST TECHNIQUE: Multiplanar, multiecho pulse sequences of the brain and surrounding structures were obtained without intravenous contrast. Angiographic images of the head were obtained using MRA technique without contrast. COMPARISON:  Angiographic images same day.  MRI 03/12/2018. FINDINGS: MRI HEAD FINDINGS Brain: The brain shows bilateral watershed distribution cortical and subcortical edema and restricted diffusion, affecting both hemispheres but more extensive on the left than the right. The question is if this represents true bilateral watershed infarction or if it represents a vaso regulatory disturbance related to spasm. No specific vascular territory infarction is seen. No sign of hemorrhage. Chronic small-vessel ischemic changes affect the brainstem and the cerebral hemispheric white matter as seen previously. No hydrocephalus. No extra-axial collection. Vascular: Major vessels at the base of the brain show flow. 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 There does appear to be a spasm appearance of the A1 vessel on the left and both anterior cerebral arteries, more extensively the right than the left. 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 No middle cerebral artery abnormality is seen.  Posterior circulation vessels continued to show a normal appearance. IMPRESSION: Bilateral watershed distribution cortical and subcortical edema and restricted diffusion, more extensive on the left than the right. Differential diagnosis is true watershed infarction versus vaso regulatory disturbance secondary to arterial spasm, which is demonstrated in the anterior cerebral vessels by MR angiography. 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 Electronically Signed   By:  Nelson Chimes M.D.   On: 03/18/2018 16:02   Mr Brain Wo Contrast  Result Date: 04/03/2018 CLINICAL DATA:  Stroke follow-up EXAM: MRI HEAD WITHOUT CONTRAST TECHNIQUE: Multiplanar, multiecho pulse sequences of the brain and surrounding structures were obtained without intravenous contrast. COMPARISON:  Brain MRI 03/17/2018 and head CT 04/03/2018 FINDINGS: BRAIN: Compared to the prior MRI of 04/03/2018 there has been progression of bilateral watershed distribution infarcts, left-greater-than-right. The amount of ischemia is worst in the left frontal lobe. There is no midline shift or other mass effect. Partially empty sella. There is cytotoxic edema greatest in the superior anterior frontal lobes. Multifocal white matter hyperintensity, most commonly due to chronic ischemic microangiopathy. The cerebral and cerebellar volume are age-appropriate. Susceptibility-sensitive sequences show no chronic microhemorrhage or superficial siderosis. VASCULAR: Major intracranial arterial and venous sinus flow voids are normal. SKULL AND UPPER CERVICAL SPINE: Calvarial bone marrow signal is normal. There is no skull base mass. Visualized upper cervical spine and soft tissues are normal. SINUSES/ORBITS: No fluid levels or advanced mucosal thickening. No mastoid or middle ear effusion. The orbits are normal. IMPRESSION: 1. Worsening bilateral watershed distribution infarcts compared to MRI of 03/14/2018. Compared to the CT from earlier today, the distribution appears unchanged, allowing for differences in modality. 2. No hemorrhage or mass effect. Electronically Signed   By: Ulyses Jarred M.D.   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 Acute onset of right arm and leg weakness. EXAM: MRI HEAD WITHOUT CONTRAST MRA HEAD WITHOUT CONTRAST TECHNIQUE: Multiplanar, multiecho pulse sequences of the brain and  surrounding structures were obtained without intravenous contrast. Angiographic images of the head were obtained using MRA technique without contrast. COMPARISON:  Angiographic images same day.  MRI 03/12/2018. FINDINGS: MRI HEAD FINDINGS Brain: The brain shows bilateral watershed distribution cortical and subcortical edema and restricted diffusion, affecting both hemispheres but more extensive on the left than the right. The question is if this represents true bilateral watershed infarction or if it represents a vaso regulatory disturbance related to spasm. No specific vascular territory infarction is seen. No sign of hemorrhage. Chronic small-vessel ischemic changes affect the brainstem and the cerebral hemispheric white matter as seen previously. No hydrocephalus. No extra-axial collection. Vascular: Major vessels at the base of the brain show flow. 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 There does appear to be a spasm appearance of the A1 vessel on the left and both anterior cerebral arteries, more extensively the right than the left. 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 No middle cerebral artery abnormality is seen. Posterior circulation vessels continued to show a normal appearance. IMPRESSION: Bilateral watershed distribution cortical and subcortical edema and restricted diffusion, more extensive on the left than the right. Differential diagnosis is true watershed infarction versus vaso regulatory disturbance secondary to arterial spasm, which is demonstrated in the anterior cerebral vessels by  MR angiography. 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 Electronically Signed   By: Nelson Chimes M.D.   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 EXAM: TRANSCATHETER THERAPY EMBOLIZATION; IR ANGIO INTRA EXTRACRAN SEL INTERNAL CAROTID UNI LEFT MOD SED; RADIOLOGY EXAMINATION; IR ANGIO INTRA EXTRACRAN SEL COM CAROTID INNOMINATE UNI RIGHT MOD SED; IR ANGIO VERTEBRAL SEL VERTEBRAL BILAT MOD SED COMPARISON:  MRI MRA of the brain of 03/12/2018. MEDICATIONS: Heparin 4,500 units IV; Ancef 2 g IV. The antibiotic was administered within 1 hour of the procedure. ANESTHESIA/SEDATION: Mac anesthesia for the diagnostic portion, and general anesthesia for the treatment portion of the procedure provided by the Department of Anesthesiology at [place]:  Isovue 300 approximately 140 mL. FLUOROSCOPY TIME:  Fluoroscopy Time: 42 minutes 12 seconds (2076 mGy). COMPLICATIONS: None immediate. TECHNIQUE: Informed written consent was obtained from the patient after a thorough discussion of the procedural risks, benefits and alternatives. All questions were addressed. Maximal Sterile Barrier Technique was utilized including caps, mask, sterile gowns, sterile gloves, sterile drape, hand hygiene and skin antiseptic. A timeout was performed prior to the initiation of the procedure. The right groin was prepped and draped in the usual sterile fashion. Thereafter using modified Seldinger technique, transfemoral access into the right common femoral artery was obtained without difficulty. Over a 0.035 inch guidewire, a 5 French Pinnacle sheath was inserted. Through this, and also over 0.035 inch guidewire, a 5 [place] JB 1 catheter was advanced to the aortic arch region and selectively positioned in the right common carotid artery, the right vertebral artery, the left common carotid artery and the left vertebral artery. FINDINGS: The right common carotid arteriogram demonstrates the right external carotid artery and its major branches to be  widely patent. The right internal carotid artery at the bulb to the cranial skull base also demonstrates wide patency. However, there are focal segmental area of small outpouchings without intraluminal narrowing in the distal 1/3 of the right internal carotid artery most compatible with mild fibromuscular dysplasia. Distal to this the cervical petrous junction is widely patent. The petrous, cavernous and supraclinoid segments demonstrate wide patency. The right middle cerebral artery is noted to opacify into the capillary and venous phases. 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 The origin the right vertebral artery is widely patent. The vessel is seen to opacify to the cranial skull base. At the level of the cranial skull base there is a focal area of mild stenosis. Distal to this the vessel assumes normal caliber. The opacified portions of the basilar artery, the superior cerebellar arteries and the anterior-inferior cerebellar arteries is normal into the capillary and venous phases. There is flash filling of the posterior cerebral arteries with unopacified blood in the distal half of the basilar artery from the contralateral vertebral artery. The left vertebral artery origin is widely patent. The vessel opacifies normally to the cranial skull base. There is mild tortuosity in its proximal half. The left vertebrobasilar junction and the left posterior-inferior cerebellar artery demonstrate normal opacification. The basilar artery, the posterior cerebral arteries, the superior cerebellar arteries and the anterior-inferior cerebellar arteries opacify into the capillary and venous phases. A left common carotid arteriogram demonstrates the left external carotid artery and its major branches to be widely patent. The left  internal carotid artery at the bulb to the cranial skull base demonstrates wide patency. The petrous, cavernous and supraclinoid segments are widely  patent. The left middle cerebral artery and the left anterior cerebral artery opacify into the capillary and venous phases. There is cross-filling via the anterior communicating artery of the right anterior cerebral artery. 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 Measurements were also performed of the left anterior cerebral artery A1 segment distally, and the right anterior cerebral artery A2 segment proximally. The angiographic findings were reviewed with the patient and the patient's husband. Again briefly reviewed were options of no treatment and conservative management versus endovascular treatment versus consideration for surgical clipping. The husband and the patient consented to endovascular treatment. They were informed that this may entail staged embolization given the wide neck of the aneurysm. The patient was then put under general anesthesia by the Department of Anesthesiology at [place] 1 catheter in the left common carotid artery was then exchanged over a 0.035 inch 300 cm Constance Holster exchange guidewire for a 6 French 80 cm Cook shuttle sheath using biplane roadmap technique constant fluoroscopic guidance. Good aspiration obtained from the hub of the [facility] shuttle sheath. A gentle control arteriogram performed through the [facility] shuttle sheath demonstrated no evidence of spasms, dissections or of intraluminal filling defects. Over a 0.035 inch Roadrunner guidewire, using biplane roadmap technique and constant fluoroscopic guidance, a Sofia 5 French 115 cm intermediary catheter was then advanced to the distal cervical segment of the left internal carotid artery followed by the [place] 5 [place] guide catheter. The guidewire was removed. Good aspiration was obtained from the hub of the [place] guide catheter. A control arteriogram performed through this  demonstrated no change in the intracranial circulation. At this time, the [facility] shuttle sheath was advanced cranially to the mid cervical left ICA. Using biplane roadmap technique and constant fluoroscopic guidance, in a coaxial manner and with constant heparinized saline infusion, a Headway 17 2 tip microcatheter was advanced over a 0.014 inch Softip Synchro micro guidewire to the distal end of the [place] guide catheter. With the micro guidewire leading with a J-tip configuration, the combination was navigated to the supraclinoid left ICA. Using a torque device, the micro guidewire was then advanced into the left anterior cerebral A1 segment followed by the microcatheter. The micro guidewire was then advanced without difficulty across the anterior communicating artery into the right anterior cerebral A2 segment distally followed by the microcatheter. The guidewire was removed. Good aspiration obtained from the hub of the microcatheter. A gentle control arteriogram performed through the microcatheter demonstrated safe position of the tip of the microcatheter. This was then connected to continuous heparinized saline infusion. It was decided to place 4 mm x 24 mm Atlas Neurofrom stent across wide neck of the aneurysm as stage I of the embolization treatment. This was then advanced in a coaxial manner and with constant heparinized saline infusion to the distal end of the microcatheter. The O ring on the delivery microcatheter was then loosened. The tip of the Sofia catheter was now in the supraclinoid left ICA. Combination of the stent within the microcatheter was then slightly retrieved ensuring that there would be adequate coverage of the neck of the aneurysm. Once there, with slight forward gentle traction with the right hand on the delivery micro guidewire, with the left hand the delivery  microcatheter was retrieved unsheathing the distal, and then the proximal portion of the stent. This was deployed without any  difficulty. The delivery micro guidewire was then removed. Control arteriogram performed through the [facility] guide catheter in the supraclinoid left ICA demonstrated excellent apposition of the proximal and the distal portion of the stent with wide patency. There was already stasis noted within the dome of the aneurysm. Control arteriograms were then performed through the [facility] guide catheter in the left internal carotid artery at 10, 20 and 40 minutes post deployment of the stent. These continued to demonstrate excellent apposition without evidence of intraluminal filling defects within the stented segment and also distally. Patency of the anterior cerebral arteries bilaterally remained patent. No abnormal dissections or of intraluminal filling defects were seen. The 5 [place] intermediary catheter and the [facility] shuttle sheath were then retrieved and removed into the abdominal aorta and exchanged over a J-tip guidewire for a 6 [place] Pinnacle sheath. Throughout the procedure, the patient's blood pressure and neurological status remained stable. The patient's ACT was maintained in the region of approximately 200 seconds. No evidence of extravasation or dissections or spasm was seen. The 6 French Pinnacle sheath was then removed with manual pressure being held. At the time of removal of the sheath there already was a small hematoma just below the puncture site. This, however, continued to worsen during the process of manual compression. During this time the patient had also been extubated and was maintaining adequate blood gases. The patient was also responsive appropriately and moving her right leg and left arm. She continued to have difficulty with weakness of the left leg and right arm. Increased tone was noted in the left leg. The patient was, otherwise, appropriately responsive in terms of speech without any facial asymmetry. Her tongue was in the midline. She did not exhibit any sensory changes. However, because of  the new neurological changes, a code stroke was called. The neurology service evaluated the patient and it was decided to proceed with further workup with an MRI scan in order to ascertain the reason for the new neurological changes. However, because of the groin hematoma in the right groin with the patient's blood pressure falling associated with tachycardia, resuscitation was performed with IV fluids albumin and vasopressors. Instead vascular surgery consult was also called. The patient was evaluated by vascular surgery and it was decided that because of the low blood pressure despite vasopressor support and the expanding hematoma in the right groin, surgical aspiration was priority prior to consideration of further workup with an MRI of the brain. After patient had been resuscitated to blood pressure of 110-120 with vasopressors IV fluids and albumin, the patient was taken to the OR for right groin surgical exploration. 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 PLAN: Follow-up of in the clinic approximately 2-4 weeks post discharge. Electronically Signed   By: Luanne Bras M.D.   On: 04/02/2018 16:34   Dg Chest Port 1 View  Result Date: 04/04/2018 CLINICAL DATA:  Respiratory failure EXAM: PORTABLE CHEST 1 VIEW COMPARISON:  Two days ago FINDINGS: Endotracheal tube tip at the clavicular heads. The orogastric tube reaches the stomach. Mild atelectatic type opacity. There is no edema, consolidation, effusion, or pneumothorax. Normal heart size. IMPRESSION: Stable hardware positioning and atelectasis. Electronically Signed   By: Monte Fantasia M.D.   On: 04/04/2018 08:13  Dg Chest Port 1 View  Result Date: 04/02/2018 CLINICAL DATA:  Endotracheal tube. Dizziness. Weakness. Liver cancer. EXAM: PORTABLE CHEST 1 VIEW COMPARISON:  03/17/2018 FINDINGS: Stable  endotracheal tube. NG tube placed. Tip is beyond the gastroesophageal junction. Normal heart size. Clear lungs. No pneumothorax. IMPRESSION: NG tube placed beyond the GE junction. Lungs remain clear. Electronically Signed   By: Marybelle Killings M.D.   On: 04/02/2018 07:58   Dg Chest Port 1 View  Result Date: 03/10/2018 CLINICAL DATA:  Status post intubation. EXAM: PORTABLE CHEST 1 VIEW COMPARISON:  None. FINDINGS: ET tube is in place with the tip approximately 1.7 cm above the carina. Lungs are clear. No pneumothorax or pleural effusion. Heart size is normal. No acute or focal bony abnormality. IMPRESSION: ETT tip is 1.7 cm above the carina. Lungs are clear. Electronically Signed   By: Inge Rise M.D.   On: 03/19/2018 16:08   Dg Abd Portable 1v  Result Date: 04/02/2018 CLINICAL DATA:  Feeding tube placement. EXAM: PORTABLE ABDOMEN - 1 VIEW COMPARISON:  None. FINDINGS: Enteric tube tip in the gastric antrum, likely near the pylorus. Nonobstructive bowel gas pattern. Visualized lungs are clear. IMPRESSION: Enteric tube tip in the distal stomach, likely near the pylorus. Electronically Signed   By: Titus Dubin M.D.   On: 03/06/2018 22:25   [place] Ekg Site Rite  Result Date: 04/03/2018 If Site Rite image not attached, placement could not be confirmed due to current cardiac rhythm.  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 EXAM: TRANSCATHETER THERAPY EMBOLIZATION; IR ANGIO INTRA EXTRACRAN SEL INTERNAL CAROTID UNI LEFT MOD SED; RADIOLOGY EXAMINATION; IR ANGIO INTRA EXTRACRAN SEL COM CAROTID INNOMINATE UNI RIGHT MOD SED; IR ANGIO VERTEBRAL SEL VERTEBRAL BILAT MOD SED COMPARISON:  MRI MRA of the brain of 03/12/2018. MEDICATIONS: Heparin 4,500 units IV; Ancef 2 g IV. The antibiotic was administered  within 1 hour of the procedure. ANESTHESIA/SEDATION: Mac anesthesia for the diagnostic portion, and general anesthesia for the treatment portion of the procedure provided by the Department of Anesthesiology at [place]:  Isovue 300 approximately 140 mL. FLUOROSCOPY TIME:  Fluoroscopy Time: 42 minutes 12 seconds (2076 mGy). COMPLICATIONS: None immediate. TECHNIQUE: Informed written consent was obtained from the patient after a thorough discussion of the procedural risks, benefits and alternatives. All questions were addressed. Maximal Sterile Barrier Technique was utilized including caps, mask, sterile gowns, sterile gloves, sterile drape, hand hygiene and skin antiseptic. A timeout was performed prior to the initiation of the procedure. The right groin was prepped and draped in the usual sterile fashion. Thereafter using modified Seldinger technique, transfemoral access into the right common femoral artery was obtained without difficulty. Over a 0.035 inch guidewire, a 5 French Pinnacle sheath was inserted. Through this, and also over 0.035 inch guidewire, a 5 [place] JB 1 catheter was advanced to the aortic arch region and selectively positioned in the right common carotid artery, the right vertebral artery, the left common carotid artery and the left vertebral artery. FINDINGS: The right common carotid arteriogram demonstrates the right external carotid artery and its major branches to be widely patent. The right internal carotid artery at the bulb to the cranial skull base also demonstrates wide patency. However, there are focal segmental area of small outpouchings without intraluminal narrowing in the distal 1/3  of the right internal carotid artery most compatible with mild fibromuscular dysplasia. Distal to this the cervical petrous junction is widely patent. The petrous, cavernous and supraclinoid segments demonstrate wide patency. The right middle cerebral artery is noted to opacify into  the capillary and venous phases. 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 The origin the right vertebral artery is widely patent. The vessel is seen to opacify to the cranial skull base. At the level of the cranial skull base there is a focal area of mild stenosis. Distal to this the vessel assumes normal caliber. The opacified portions of the basilar artery, the superior cerebellar arteries and the anterior-inferior cerebellar arteries is normal into the capillary and venous phases. There is flash filling of the posterior cerebral arteries with unopacified blood in the distal half of the basilar artery from the contralateral vertebral artery. The left vertebral artery origin is widely patent. The vessel opacifies normally to the cranial skull base. There is mild tortuosity in its proximal half. The left vertebrobasilar junction and the left posterior-inferior cerebellar artery demonstrate normal opacification. The basilar artery, the posterior cerebral arteries, the superior cerebellar arteries and the anterior-inferior cerebellar arteries opacify into the capillary and venous phases. A left common carotid arteriogram demonstrates the left external carotid artery and its major branches to be widely patent. The left internal carotid artery at the bulb to the cranial skull base demonstrates wide patency. The petrous, cavernous and supraclinoid segments are widely patent. The left middle cerebral artery and the left anterior cerebral artery opacify into the capillary and venous phases. There is cross-filling via the anterior communicating artery of the right anterior cerebral artery. 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 Measurements were also performed of the left anterior cerebral artery A1 segment distally, and the right anterior cerebral  artery A2 segment proximally. The angiographic findings were reviewed with the patient and the patient's husband. Again briefly reviewed were options of no treatment and conservative management versus endovascular treatment versus consideration for surgical clipping. The husband and the patient consented to endovascular treatment. They were informed that this may entail staged embolization given the wide neck of the aneurysm. The patient was then put under general anesthesia by the Department of Anesthesiology at [place] 1 catheter in the left common carotid artery was then exchanged over a 0.035 inch 300 cm Constance Holster exchange guidewire for a 6 French 80 cm Cook shuttle sheath using biplane roadmap technique constant fluoroscopic guidance. Good aspiration obtained from the hub of the [facility] shuttle sheath. A gentle control arteriogram performed through the [facility] shuttle sheath demonstrated no evidence of spasms, dissections or of intraluminal filling defects. Over a 0.035 inch Roadrunner guidewire, using biplane roadmap technique and constant fluoroscopic guidance, a Sofia 5 French 115 cm intermediary catheter was then advanced to the distal cervical segment of the left internal carotid artery followed by the [place] 5 [place] guide catheter. The guidewire was removed. Good aspiration was obtained from the hub of the [place] guide catheter. A control arteriogram performed through this demonstrated no change in the intracranial circulation. At this time, the [facility] shuttle sheath was advanced cranially to the mid cervical left ICA. Using biplane roadmap technique and constant fluoroscopic guidance, in a coaxial manner and with constant heparinized saline infusion, a Headway 17 2 tip microcatheter was advanced over  a 0.014 inch Softip Synchro micro guidewire to the distal end of the [place] guide catheter. With the micro guidewire leading with a J-tip configuration, the combination was navigated to the  supraclinoid left ICA. Using a torque device, the micro guidewire was then advanced into the left anterior cerebral A1 segment followed by the microcatheter. The micro guidewire was then advanced without difficulty across the anterior communicating artery into the right anterior cerebral A2 segment distally followed by the microcatheter. The guidewire was removed. Good aspiration obtained from the hub of the microcatheter. A gentle control arteriogram performed through the microcatheter demonstrated safe position of the tip of the microcatheter. This was then connected to continuous heparinized saline infusion. It was decided to place 4 mm x 24 mm Atlas Neurofrom stent across wide neck of the aneurysm as stage I of the embolization treatment. This was then advanced in a coaxial manner and with constant heparinized saline infusion to the distal end of the microcatheter. The O ring on the delivery microcatheter was then loosened. The tip of the Sofia catheter was now in the supraclinoid left ICA. Combination of the stent within the microcatheter was then slightly retrieved ensuring that there would be adequate coverage of the neck of the aneurysm. Once there, with slight forward gentle traction with the right hand on the delivery micro guidewire, with the left hand the delivery microcatheter was retrieved unsheathing the distal, and then the proximal portion of the stent. This was deployed without any difficulty. The delivery micro guidewire was then removed. Control arteriogram performed through the [facility] guide catheter in the supraclinoid left ICA demonstrated excellent apposition of the proximal and the distal portion of the stent with wide patency. There was already stasis noted within the dome of the aneurysm. Control arteriograms were then performed through the [facility] guide catheter in the left internal carotid artery at 10, 20 and 40 minutes post deployment of the stent. These continued to demonstrate excellent  apposition without evidence of intraluminal filling defects within the stented segment and also distally. Patency of the anterior cerebral arteries bilaterally remained patent. No abnormal dissections or of intraluminal filling defects were seen. The 5 [place] intermediary catheter and the [facility] shuttle sheath were then retrieved and removed into the abdominal aorta and exchanged over a J-tip guidewire for a 6 [place] Pinnacle sheath. Throughout the procedure, the patient's blood pressure and neurological status remained stable. The patient's ACT was maintained in the region of approximately 200 seconds. No evidence of extravasation or dissections or spasm was seen. The 6 French Pinnacle sheath was then removed with manual pressure being held. At the time of removal of the sheath there already was a small hematoma just below the puncture site. This, however, continued to worsen during the process of manual compression. During this time the patient had also been extubated and was maintaining adequate blood gases. The patient was also responsive appropriately and moving her right leg and left arm. She continued to have difficulty with weakness of the left leg and right arm. Increased tone was noted in the left leg. The patient was, otherwise, appropriately responsive in terms of speech without any facial asymmetry. Her tongue was in the midline. She did not exhibit any sensory changes. However, because of the new neurological changes, a code stroke was called. The neurology service evaluated the patient and it was decided to proceed with further workup with an MRI scan in order to ascertain the reason for the new neurological changes. However, because of  the groin hematoma in the right groin with the patient's blood pressure falling associated with tachycardia, resuscitation was performed with IV fluids albumin and vasopressors. Instead vascular surgery consult was also called. The patient was evaluated by vascular  surgery and it was decided that because of the low blood pressure despite vasopressor support and the expanding hematoma in the right groin, surgical aspiration was priority prior to consideration of further workup with an MRI of the brain. After patient had been resuscitated to blood pressure of 110-120 with vasopressors IV fluids and albumin, the patient was taken to the OR for right groin surgical exploration. 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 PLAN: Follow-up of in the clinic approximately 2-4 weeks post discharge. Electronically Signed   By: Luanne Bras M.D.   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 EXAM: TRANSCATHETER THERAPY EMBOLIZATION; IR ANGIO INTRA EXTRACRAN SEL INTERNAL CAROTID UNI LEFT MOD SED; RADIOLOGY EXAMINATION; IR ANGIO INTRA EXTRACRAN SEL COM CAROTID INNOMINATE UNI RIGHT MOD SED; IR ANGIO VERTEBRAL SEL VERTEBRAL BILAT MOD SED COMPARISON:  MRI MRA of the brain of 03/12/2018. MEDICATIONS: Heparin 4,500 units IV; Ancef 2 g IV. The antibiotic was administered within 1 hour of the procedure. ANESTHESIA/SEDATION: Mac anesthesia for the diagnostic portion, and general anesthesia for the treatment portion of the procedure provided by the Department of Anesthesiology at [place]:  Isovue 300 approximately 140 mL. FLUOROSCOPY TIME:  Fluoroscopy Time: 42 minutes 12 seconds (2076 mGy). COMPLICATIONS: None immediate. TECHNIQUE: Informed written consent was obtained from the patient after a thorough discussion of the procedural risks, benefits and alternatives. All questions were addressed. Maximal  Sterile Barrier Technique was utilized including caps, mask, sterile gowns, sterile gloves, sterile drape, hand hygiene and skin antiseptic. A timeout was performed prior to the initiation of the procedure. The right groin was prepped and draped in the usual sterile fashion. Thereafter using modified Seldinger technique, transfemoral access into the right common femoral artery was obtained without difficulty. Over a 0.035 inch guidewire, a 5 French Pinnacle sheath was inserted. Through this, and also over 0.035 inch guidewire, a 5 [place] JB 1 catheter was advanced to the aortic arch region and selectively positioned in the right common carotid artery, the right vertebral artery, the left common carotid artery and the left vertebral artery. FINDINGS: The right common carotid arteriogram demonstrates the right external carotid artery and its major branches to be widely patent. The right internal carotid artery at the bulb to the cranial skull base also demonstrates wide patency. However, there are focal segmental area of small outpouchings without intraluminal narrowing in the distal 1/3 of the right internal carotid artery most compatible with mild fibromuscular dysplasia. Distal to this the cervical petrous junction is widely patent. The petrous, cavernous and supraclinoid segments demonstrate wide patency. The right middle cerebral artery is noted to opacify into the capillary and venous phases. 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 The origin the right vertebral artery is widely patent. The vessel is seen to opacify to the cranial skull base. At the level of the cranial skull base there is a focal  area of mild stenosis. Distal to this the vessel assumes normal caliber. The opacified portions of the basilar artery, the superior cerebellar arteries and the anterior-inferior cerebellar arteries is normal into the capillary and venous phases. There is flash filling of  the posterior cerebral arteries with unopacified blood in the distal half of the basilar artery from the contralateral vertebral artery. The left vertebral artery origin is widely patent. The vessel opacifies normally to the cranial skull base. There is mild tortuosity in its proximal half. The left vertebrobasilar junction and the left posterior-inferior cerebellar artery demonstrate normal opacification. The basilar artery, the posterior cerebral arteries, the superior cerebellar arteries and the anterior-inferior cerebellar arteries opacify into the capillary and venous phases. A left common carotid arteriogram demonstrates the left external carotid artery and its major branches to be widely patent. The left internal carotid artery at the bulb to the cranial skull base demonstrates wide patency. The petrous, cavernous and supraclinoid segments are widely patent. The left middle cerebral artery and the left anterior cerebral artery opacify into the capillary and venous phases. There is cross-filling via the anterior communicating artery of the right anterior cerebral artery. 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 Measurements were also performed of the left anterior cerebral artery A1 segment distally, and the right anterior cerebral artery A2 segment proximally. The angiographic findings were reviewed with the patient and the patient's husband. Again briefly reviewed were options of no treatment and conservative management versus endovascular treatment versus consideration for surgical clipping. The husband and the patient consented to endovascular treatment. They were informed that this may entail staged embolization given the wide neck of the aneurysm. The patient was then put under general anesthesia by the Department of Anesthesiology at [place] 1  catheter in the left common carotid artery was then exchanged over a 0.035 inch 300 cm Constance Holster exchange guidewire for a 6 French 80 cm Cook shuttle sheath using biplane roadmap technique constant fluoroscopic guidance. Good aspiration obtained from the hub of the [facility] shuttle sheath. A gentle control arteriogram performed through the [facility] shuttle sheath demonstrated no evidence of spasms, dissections or of intraluminal filling defects. Over a 0.035 inch Roadrunner guidewire, using biplane roadmap technique and constant fluoroscopic guidance, a Sofia 5 French 115 cm intermediary catheter was then advanced to the distal cervical segment of the left internal carotid artery followed by the [place] 5 [place] guide catheter. The guidewire was removed. Good aspiration was obtained from the hub of the [place] guide catheter. A control arteriogram performed through this demonstrated no change in the intracranial circulation. At this time, the [facility] shuttle sheath was advanced cranially to the mid cervical left ICA. Using biplane roadmap technique and constant fluoroscopic guidance, in a coaxial manner and with constant heparinized saline infusion, a Headway 17 2 tip microcatheter was advanced over a 0.014 inch Softip Synchro micro guidewire to the distal end of the [place] guide catheter. With the micro guidewire leading with a J-tip configuration, the combination was navigated to the supraclinoid left ICA. Using a torque device, the micro guidewire was then advanced into the left anterior cerebral A1 segment followed by the microcatheter. The micro guidewire was then advanced without difficulty across the anterior communicating artery into the right anterior cerebral A2 segment distally followed by the microcatheter. The guidewire was removed. Good aspiration obtained from the hub of the  microcatheter. A gentle control arteriogram performed through the microcatheter demonstrated safe position of the tip of the microcatheter. This was  then connected to continuous heparinized saline infusion. It was decided to place 4 mm x 24 mm Atlas Neurofrom stent across wide neck of the aneurysm as stage I of the embolization treatment. This was then advanced in a coaxial manner and with constant heparinized saline infusion to the distal end of the microcatheter. The O ring on the delivery microcatheter was then loosened. The tip of the Sofia catheter was now in the supraclinoid left ICA. Combination of the stent within the microcatheter was then slightly retrieved ensuring that there would be adequate coverage of the neck of the aneurysm. Once there, with slight forward gentle traction with the right hand on the delivery micro guidewire, with the left hand the delivery microcatheter was retrieved unsheathing the distal, and then the proximal portion of the stent. This was deployed without any difficulty. The delivery micro guidewire was then removed. Control arteriogram performed through the [facility] guide catheter in the supraclinoid left ICA demonstrated excellent apposition of the proximal and the distal portion of the stent with wide patency. There was already stasis noted within the dome of the aneurysm. Control arteriograms were then performed through the [facility] guide catheter in the left internal carotid artery at 10, 20 and 40 minutes post deployment of the stent. These continued to demonstrate excellent apposition without evidence of intraluminal filling defects within the stented segment and also distally. Patency of the anterior cerebral arteries bilaterally remained patent. No abnormal dissections or of intraluminal filling defects were seen. The 5 [place] intermediary catheter and the [facility] shuttle sheath were then retrieved and removed into the abdominal aorta and exchanged over a J-tip guidewire for a 6 [place] Pinnacle sheath. Throughout the procedure, the patient's blood pressure and neurological status remained stable. The patient's ACT was  maintained in the region of approximately 200 seconds. No evidence of extravasation or dissections or spasm was seen. The 6 French Pinnacle sheath was then removed with manual pressure being held. At the time of removal of the sheath there already was a small hematoma just below the puncture site. This, however, continued to worsen during the process of manual compression. During this time the patient had also been extubated and was maintaining adequate blood gases. The patient was also responsive appropriately and moving her right leg and left arm. She continued to have difficulty with weakness of the left leg and right arm. Increased tone was noted in the left leg. The patient was, otherwise, appropriately responsive in terms of speech without any facial asymmetry. Her tongue was in the midline. She did not exhibit any sensory changes. However, because of the new neurological changes, a code stroke was called. The neurology service evaluated the patient and it was decided to proceed with further workup with an MRI scan in order to ascertain the reason for the new neurological changes. However, because of the groin hematoma in the right groin with the patient's blood pressure falling associated with tachycardia, resuscitation was performed with IV fluids albumin and vasopressors. Instead vascular surgery consult was also called. The patient was evaluated by vascular surgery and it was decided that because of the low blood pressure despite vasopressor support and the expanding hematoma in the right groin, surgical aspiration was priority prior to consideration of further workup with an MRI of the brain. After patient had been resuscitated to blood pressure of 110-120 with vasopressors IV fluids and  albumin, the patient was taken to the OR for right groin surgical exploration. 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 PLAN: Follow-up of in the clinic approximately 2-4 weeks post discharge. Electronically Signed   By: Luanne Bras M.D.   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 EXAM: TRANSCATHETER THERAPY EMBOLIZATION; IR ANGIO INTRA EXTRACRAN SEL INTERNAL CAROTID UNI LEFT MOD SED; RADIOLOGY EXAMINATION; IR ANGIO INTRA EXTRACRAN SEL COM CAROTID INNOMINATE UNI RIGHT MOD SED; IR ANGIO VERTEBRAL SEL VERTEBRAL BILAT MOD SED COMPARISON:  MRI MRA of the brain of 03/12/2018. MEDICATIONS: Heparin 4,500 units IV; Ancef 2 g IV. The antibiotic was administered within 1 hour of the procedure. ANESTHESIA/SEDATION: Mac anesthesia for the diagnostic portion, and general anesthesia for the treatment portion of the procedure provided by the Department of Anesthesiology at [place]:  Isovue 300 approximately 140 mL. FLUOROSCOPY TIME:  Fluoroscopy Time: 42 minutes 12 seconds (2076 mGy). COMPLICATIONS: None immediate. TECHNIQUE: Informed written consent was obtained from the patient after a thorough discussion of the procedural risks, benefits and alternatives. All questions were addressed. Maximal Sterile Barrier Technique was utilized including caps, mask, sterile gowns, sterile gloves, sterile drape, hand hygiene and skin antiseptic. A timeout was performed prior to the initiation of the procedure. The right groin was prepped and draped in the usual sterile fashion. Thereafter using modified Seldinger technique, transfemoral access into the right common femoral artery was obtained without difficulty. Over a 0.035 inch guidewire, a 5 French Pinnacle sheath was inserted. Through this, and also over 0.035 inch guidewire, a 5 [place] JB 1 catheter was advanced  to the aortic arch region and selectively positioned in the right common carotid artery, the right vertebral artery, the left common carotid artery and the left vertebral artery. FINDINGS: The right common carotid arteriogram demonstrates the right external carotid artery and its major branches to be widely patent. The right internal carotid artery at the bulb to the cranial skull base also demonstrates wide patency. However, there are focal segmental area of small outpouchings without intraluminal narrowing in the distal 1/3 of the right internal carotid artery most compatible with mild fibromuscular dysplasia. Distal to this the cervical petrous junction is widely patent. The petrous, cavernous and supraclinoid segments demonstrate wide patency. The right middle cerebral artery is noted to opacify into the capillary and venous phases. 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 The origin the right vertebral artery is widely patent. The vessel is seen to opacify to the cranial skull base. At the level of the cranial skull base there is a focal area of mild stenosis. Distal to this the vessel assumes normal caliber. The opacified portions of the basilar artery, the superior cerebellar arteries and the anterior-inferior cerebellar arteries is normal into the capillary and venous phases. There is flash filling of the posterior cerebral arteries with unopacified blood in the distal half of the basilar artery from the contralateral vertebral artery. The left vertebral artery origin is widely patent. The vessel opacifies normally to the cranial skull base. There is mild tortuosity in its proximal half. The left vertebrobasilar junction and the left posterior-inferior cerebellar  artery demonstrate normal opacification. The basilar artery, the posterior cerebral arteries, the superior cerebellar arteries and the anterior-inferior cerebellar arteries opacify into the capillary and  venous phases. A left common carotid arteriogram demonstrates the left external carotid artery and its major branches to be widely patent. The left internal carotid artery at the bulb to the cranial skull base demonstrates wide patency. The petrous, cavernous and supraclinoid segments are widely patent. The left middle cerebral artery and the left anterior cerebral artery opacify into the capillary and venous phases. There is cross-filling via the anterior communicating artery of the right anterior cerebral artery. 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 Measurements were also performed of the left anterior cerebral artery A1 segment distally, and the right anterior cerebral artery A2 segment proximally. The angiographic findings were reviewed with the patient and the patient's husband. Again briefly reviewed were options of no treatment and conservative management versus endovascular treatment versus consideration for surgical clipping. The husband and the patient consented to endovascular treatment. They were informed that this may entail staged embolization given the wide neck of the aneurysm. The patient was then put under general anesthesia by the Department of Anesthesiology at [place] 1 catheter in the left common carotid artery was then exchanged over a 0.035 inch 300 cm Constance Holster exchange guidewire for a 6 French 80 cm Cook shuttle sheath using biplane roadmap technique constant fluoroscopic guidance. Good aspiration obtained from the hub of the [facility] shuttle sheath. A gentle control arteriogram performed through the [facility] shuttle sheath demonstrated no evidence of spasms, dissections or of intraluminal filling defects. Over a 0.035 inch Roadrunner guidewire, using biplane roadmap technique and constant fluoroscopic guidance, a Sofia 5 French 115 cm  intermediary catheter was then advanced to the distal cervical segment of the left internal carotid artery followed by the [place] 5 [place] guide catheter. The guidewire was removed. Good aspiration was obtained from the hub of the [place] guide catheter. A control arteriogram performed through this demonstrated no change in the intracranial circulation. At this time, the [facility] shuttle sheath was advanced cranially to the mid cervical left ICA. Using biplane roadmap technique and constant fluoroscopic guidance, in a coaxial manner and with constant heparinized saline infusion, a Headway 17 2 tip microcatheter was advanced over a 0.014 inch Softip Synchro micro guidewire to the distal end of the [place] guide catheter. With the micro guidewire leading with a J-tip configuration, the combination was navigated to the supraclinoid left ICA. Using a torque device, the micro guidewire was then advanced into the left anterior cerebral A1 segment followed by the microcatheter. The micro guidewire was then advanced without difficulty across the anterior communicating artery into the right anterior cerebral A2 segment distally followed by the microcatheter. The guidewire was removed. Good aspiration obtained from the hub of the microcatheter. A gentle control arteriogram performed through the microcatheter demonstrated safe position of the tip of the microcatheter. This was then connected to continuous heparinized saline infusion. It was decided to place 4 mm x 24 mm Atlas Neurofrom stent across wide neck of the aneurysm as stage I of the embolization treatment. This was then advanced in a coaxial manner and with constant heparinized saline infusion to the distal end of the microcatheter. The O ring on the delivery microcatheter was then loosened. The tip of the Sofia catheter was now in the supraclinoid  left ICA. Combination of the stent within the microcatheter was then slightly retrieved ensuring that there would be adequate  coverage of the neck of the aneurysm. Once there, with slight forward gentle traction with the right hand on the delivery micro guidewire, with the left hand the delivery microcatheter was retrieved unsheathing the distal, and then the proximal portion of the stent. This was deployed without any difficulty. The delivery micro guidewire was then removed. Control arteriogram performed through the [facility] guide catheter in the supraclinoid left ICA demonstrated excellent apposition of the proximal and the distal portion of the stent with wide patency. There was already stasis noted within the dome of the aneurysm. Control arteriograms were then performed through the [facility] guide catheter in the left internal carotid artery at 10, 20 and 40 minutes post deployment of the stent. These continued to demonstrate excellent apposition without evidence of intraluminal filling defects within the stented segment and also distally. Patency of the anterior cerebral arteries bilaterally remained patent. No abnormal dissections or of intraluminal filling defects were seen. The 5 [place] intermediary catheter and the [facility] shuttle sheath were then retrieved and removed into the abdominal aorta and exchanged over a J-tip guidewire for a 6 [place] Pinnacle sheath. Throughout the procedure, the patient's blood pressure and neurological status remained stable. The patient's ACT was maintained in the region of approximately 200 seconds. No evidence of extravasation or dissections or spasm was seen. The 6 French Pinnacle sheath was then removed with manual pressure being held. At the time of removal of the sheath there already was a small hematoma just below the puncture site. This, however, continued to worsen during the process of manual compression. During this time the patient had also been extubated and was maintaining adequate blood gases. The patient was also responsive appropriately and moving her right leg and left arm. She continued to  have difficulty with weakness of the left leg and right arm. Increased tone was noted in the left leg. The patient was, otherwise, appropriately responsive in terms of speech without any facial asymmetry. Her tongue was in the midline. She did not exhibit any sensory changes. However, because of the new neurological changes, a code stroke was called. The neurology service evaluated the patient and it was decided to proceed with further workup with an MRI scan in order to ascertain the reason for the new neurological changes. However, because of the groin hematoma in the right groin with the patient's blood pressure falling associated with tachycardia, resuscitation was performed with IV fluids albumin and vasopressors. Instead vascular surgery consult was also called. The patient was evaluated by vascular surgery and it was decided that because of the low blood pressure despite vasopressor support and the expanding hematoma in the right groin, surgical aspiration was priority prior to consideration of further workup with an MRI of the brain. After patient had been resuscitated to blood pressure of 110-120 with vasopressors IV fluids and albumin, the patient was taken to the OR for right groin surgical exploration. 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 PLAN: Follow-up of in the clinic approximately 2-4 weeks post discharge. Electronically Signed   By: Luanne Bras M.D.   On: 04/02/2018 16:34   Ir Neuro Each Add'l After Basic Uni Left (ms)  Result Date: 04/03/2018 CLINICAL DATA:  Patient with headaches and discovery of a bilobed approximately  5.5 mm saccular aneurysm arising in the anterior communicating artery region. EXAM: TRANSCATHETER THERAPY EMBOLIZATION; IR ANGIO INTRA EXTRACRAN SEL INTERNAL CAROTID UNI LEFT MOD SED; RADIOLOGY EXAMINATION; IR ANGIO  INTRA EXTRACRAN SEL COM CAROTID INNOMINATE UNI RIGHT MOD SED; IR ANGIO VERTEBRAL SEL VERTEBRAL BILAT MOD SED COMPARISON:  MRI MRA of the brain of 03/12/2018. MEDICATIONS: Heparin 4,500 units IV; Ancef 2 g IV. The antibiotic was administered within 1 hour of the procedure. ANESTHESIA/SEDATION: Mac anesthesia for the diagnostic portion, and general anesthesia for the treatment portion of the procedure provided by the Department of Anesthesiology at [place]:  Isovue 300 approximately 140 mL. FLUOROSCOPY TIME:  Fluoroscopy Time: 42 minutes 12 seconds (2076 mGy). COMPLICATIONS: None immediate. TECHNIQUE: Informed written consent was obtained from the patient after a thorough discussion of the procedural risks, benefits and alternatives. All questions were addressed. Maximal Sterile Barrier Technique was utilized including caps, mask, sterile gowns, sterile gloves, sterile drape, hand hygiene and skin antiseptic. A timeout was performed prior to the initiation of the procedure. The right groin was prepped and draped in the usual sterile fashion. Thereafter using modified Seldinger technique, transfemoral access into the right common femoral artery was obtained without difficulty. Over a 0.035 inch guidewire, a 5 French Pinnacle sheath was inserted. Through this, and also over 0.035 inch guidewire, a 5 [place] JB 1 catheter was advanced to the aortic arch region and selectively positioned in the right common carotid artery, the right vertebral artery, the left common carotid artery and the left vertebral artery. FINDINGS: The right common carotid arteriogram demonstrates the right external carotid artery and its major branches to be widely patent. The right internal carotid artery at the bulb to the cranial skull base also demonstrates wide patency. However, there are focal segmental area of small outpouchings without intraluminal narrowing in the distal 1/3 of the right internal carotid artery most  compatible with mild fibromuscular dysplasia. Distal to this the cervical petrous junction is widely patent. The petrous, cavernous and supraclinoid segments demonstrate wide patency. The right middle cerebral artery is noted to opacify into the capillary and venous phases. 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 The origin the right vertebral artery is widely patent. The vessel is seen to opacify to the cranial skull base. At the level of the cranial skull base there is a focal area of mild stenosis. Distal to this the vessel assumes normal caliber. The opacified portions of the basilar artery, the superior cerebellar arteries and the anterior-inferior cerebellar arteries is normal into the capillary and venous phases. There is flash filling of the posterior cerebral arteries with unopacified blood in the distal half of the basilar artery from the contralateral vertebral artery. The left vertebral artery origin is widely patent. The vessel opacifies normally to the cranial skull base. There is mild tortuosity in its proximal half. The left vertebrobasilar junction and the left posterior-inferior cerebellar artery demonstrate normal opacification. The basilar artery, the posterior cerebral arteries, the superior cerebellar arteries and the anterior-inferior cerebellar arteries opacify into the capillary and venous phases. A left common carotid arteriogram demonstrates the left external carotid artery and its major branches to be widely patent. The left internal carotid artery at the bulb to the cranial skull base demonstrates wide patency. The petrous, cavernous and supraclinoid segments are widely patent. The left middle cerebral artery and the left anterior cerebral artery opacify into the capillary and venous phases. There is cross-filling via the  anterior communicating artery of the right anterior cerebral artery. 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 Measurements were also performed of the left anterior cerebral artery A1 segment distally, and the right anterior cerebral artery A2 segment proximally. The angiographic findings were reviewed with the patient and the patient's husband. Again briefly reviewed were options of no treatment and conservative management versus endovascular treatment versus consideration for surgical clipping. The husband and the patient consented to endovascular treatment. They were informed that this may entail staged embolization given the wide neck of the aneurysm. The patient was then put under general anesthesia by the Department of Anesthesiology at [place] 1 catheter in the left common carotid artery was then exchanged over a 0.035 inch 300 cm Constance Holster exchange guidewire for a 6 French 80 cm Cook shuttle sheath using biplane roadmap technique constant fluoroscopic guidance. Good aspiration obtained from the hub of the [facility] shuttle sheath. A gentle control arteriogram performed through the [facility] shuttle sheath demonstrated no evidence of spasms, dissections or of intraluminal filling defects. Over a 0.035 inch Roadrunner guidewire, using biplane roadmap technique and constant fluoroscopic guidance, a Sofia 5 French 115 cm intermediary catheter was then advanced to the distal cervical segment of the left internal carotid artery followed by the [place] 5 [place] guide catheter. The guidewire was removed. Good aspiration was obtained from the hub of the [place] guide catheter. A control arteriogram performed through this demonstrated no change in the intracranial circulation. At this time, the [facility] shuttle sheath was advanced cranially to the mid cervical left ICA. Using biplane roadmap technique and constant fluoroscopic guidance, in a coaxial manner and with constant heparinized saline  infusion, a Headway 17 2 tip microcatheter was advanced over a 0.014 inch Softip Synchro micro guidewire to the distal end of the [place] guide catheter. With the micro guidewire leading with a J-tip configuration, the combination was navigated to the supraclinoid left ICA. Using a torque device, the micro guidewire was then advanced into the left anterior cerebral A1 segment followed by the microcatheter. The micro guidewire was then advanced without difficulty across the anterior communicating artery into the right anterior cerebral A2 segment distally followed by the microcatheter. The guidewire was removed. Good aspiration obtained from the hub of the microcatheter. A gentle control arteriogram performed through the microcatheter demonstrated safe position of the tip of the microcatheter. This was then connected to continuous heparinized saline infusion. It was decided to place 4 mm x 24 mm Atlas Neurofrom stent across wide neck of the aneurysm as stage I of the embolization treatment. This was then advanced in a coaxial manner and with constant heparinized saline infusion to the distal end of the microcatheter. The O ring on the delivery microcatheter was then loosened. The tip of the Sofia catheter was now in the supraclinoid left ICA. Combination of the stent within the microcatheter was then slightly retrieved ensuring that there would be adequate coverage of the neck of the aneurysm. Once there, with slight forward gentle traction with the right hand on the delivery micro guidewire, with the left hand the delivery microcatheter was retrieved unsheathing the distal, and then the proximal portion of the stent. This was deployed without any difficulty. The delivery micro guidewire was then removed. Control arteriogram performed through the [facility] guide catheter in the supraclinoid left ICA demonstrated excellent apposition of the proximal  and the distal portion of the stent with wide patency. There was already  stasis noted within the dome of the aneurysm. Control arteriograms were then performed through the [facility] guide catheter in the left internal carotid artery at 10, 20 and 40 minutes post deployment of the stent. These continued to demonstrate excellent apposition without evidence of intraluminal filling defects within the stented segment and also distally. Patency of the anterior cerebral arteries bilaterally remained patent. No abnormal dissections or of intraluminal filling defects were seen. The 5 [place] intermediary catheter and the [facility] shuttle sheath were then retrieved and removed into the abdominal aorta and exchanged over a J-tip guidewire for a 6 [place] Pinnacle sheath. Throughout the procedure, the patient's blood pressure and neurological status remained stable. The patient's ACT was maintained in the region of approximately 200 seconds. No evidence of extravasation or dissections or spasm was seen. The 6 French Pinnacle sheath was then removed with manual pressure being held. At the time of removal of the sheath there already was a small hematoma just below the puncture site. This, however, continued to worsen during the process of manual compression. During this time the patient had also been extubated and was maintaining adequate blood gases. The patient was also responsive appropriately and moving her right leg and left arm. She continued to have difficulty with weakness of the left leg and right arm. Increased tone was noted in the left leg. The patient was, otherwise, appropriately responsive in terms of speech without any facial asymmetry. Her tongue was in the midline. She did not exhibit any sensory changes. However, because of the new neurological changes, a code stroke was called. The neurology service evaluated the patient and it was decided to proceed with further workup with an MRI scan in order to ascertain the reason for the new neurological changes. However, because of the groin hematoma  in the right groin with the patient's blood pressure falling associated with tachycardia, resuscitation was performed with IV fluids albumin and vasopressors. Instead vascular surgery consult was also called. The patient was evaluated by vascular surgery and it was decided that because of the low blood pressure despite vasopressor support and the expanding hematoma in the right groin, surgical aspiration was priority prior to consideration of further workup with an MRI of the brain. After patient had been resuscitated to blood pressure of 110-120 with vasopressors IV fluids and albumin, the patient was taken to the OR for right groin surgical exploration. 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 PLAN: Follow-up of in the clinic approximately 2-4 weeks post discharge. Electronically Signed   By: Luanne Bras M.D.   On: 04/02/2018 16:34    Labs:  CBC: Recent Labs    03/28/2018 2022 04/02/18 0449 04/03/18 0730 04/04/18 0432  WBC 16.1* 15.6* 19.0* 7.5  HGB 12.5 11.6* 9.9* 8.4*  HCT 38.4 34.7* 32.2* 26.5*  PLT 159 152 115* 80*    COAGS: Recent Labs    03/10/18 1058 04/04/2018 0653 03/17/2018 1608  INR 0.97 1.03 1.24  APTT 25  --  26    BMP: Recent Labs    03/31/2018 0653 04/04/2018 1141  03/24/2018 1320 03/23/2018 1608 04/02/18 0449 04/03/18 0724  NA 141 143   < > 142 136 139 141  K 4.1 4.2   < > 4.5 4.4 4.6 4.2  CL 111  --   --   --  111 115* 118*  CO2 21*  --   --   --  17* 18* 19*  GLUCOSE 83 125*  --   --  165* 127* 138*  BUN 16  --   --   --  13 15 32*  CALCIUM 9.8  --   --   --  9.3 8.9 8.5*  CREATININE 1.30*  --   --   --  1.04* 1.05* 1.78*  GFRNONAA 46*  --   --   --  >60 60* 32*  GFRAA 53*  --   --   --  >60 >60 37*   < > = values in this interval not displayed.    LIVER FUNCTION TESTS: Recent Labs    03/10/18 1058  03/11/18 2024  BILITOT 0.5 0.3  AST 18 16  ALT 17 14  ALKPHOS 101 80  PROT 6.9 5.7*  ALBUMIN 3.8 3.2*    Assessment and Plan:  Patient with ACOM aneurysm s/p embolization using ATLAS neuroform stent 03/09/2018 with Dr. Estanislado Pandy. Patient remains intubated, sedated, opens eyes to voice but does not track or follow commands. Does not blink to left sided interrogation. Increased muscle tone in all extremities, unable to unclench left hand on exam. Per RN spontaneously moves right toes however not noted on today's exam.   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  Right groin incision unchanged - followed by vascular surgery.  SCDs ordered 1/28 per chart however not yet applied - discussed with RN at bedside who will apply today.  Continue Brilinta 90 mg BID + ASA 81 mg QD.   Appreciate and agree with neurology, CCM and vascular surgery management. IR will continue to follow.   Electronically Signed: Joaquim Nam, PA-C 04/04/2018, 8:50 AM   I spent a total of 25 Minutes at the the patient's bedside AND on the patient's hospital floor or unit, greater than 50% of which was counseling/coordinating care for follow up ACOM aneurysm s/p embolization.

## 2018-04-04 NOTE — Progress Notes (Signed)
  Echocardiogram 2D Echocardiogram has been performed.  Darlina Sicilian M 04/04/2018, 3:26 PM

## 2018-04-04 NOTE — Progress Notes (Signed)
Peripherally Inserted Central Catheter/Midline Placement  The IV Nurse has discussed with the patient and/or persons authorized to consent for the patient, the purpose of this procedure and the potential benefits and risks involved with this procedure.  The benefits include less needle sticks, lab draws from the catheter, and the patient may be discharged home with the catheter. Risks include, but not limited to, infection, bleeding, blood clot (thrombus formation), and puncture of an artery; nerve damage and irregular heartbeat and possibility to perform a PICC exchange if needed/ordered by physician.  Alternatives to this procedure were also discussed.  Bard Power PICC patient education guide, fact sheet on infection prevention and patient information card has been provided to patient /or left at bedside.    PICC/Midline Placement Documentation  PICC Double Lumen 04/04/18 PICC Right Cephalic 37 cm 0 cm (Active)  Indication for Insertion or Continuance of Line Vasoactive infusions 04/04/2018  2:09 PM  Exposed Catheter (cm) 0 cm 04/04/2018  2:09 PM  Site Assessment Clean;Dry;Intact 04/04/2018  2:09 PM  Lumen #1 Status Flushed;Blood return noted;Saline locked 04/04/2018  2:09 PM  Lumen #2 Status Flushed;Blood return noted;Saline locked 04/04/2018  2:09 PM  Dressing Type Transparent 04/04/2018  2:09 PM  Dressing Status Clean;Dry;Intact 04/04/2018  2:09 PM  Dressing Change Due 04/15/2018 04/04/2018  2:09 PM       Scotty Court 04/04/2018, 2:10 PM

## 2018-04-04 NOTE — Progress Notes (Signed)
STROKE TEAM PROGRESS NOTE   SUBJECTIVE (INTERVAL HISTORY) Her husband is at the bedside. No significant neuro changes over night. Still increase tone at neck, BUEs and BLEs. Eyes open with voice but not responding.  More consistent with vegetative state at this time.  Still intubated, BP improved on low-dose Neo-Synephrine. oligouria improved.  MRI repeated showed extensive bifrontal and bilateral MCA/ACA territory infarcts.   OBJECTIVE Temp:  [98.5 F (36.9 C)-100.4 F (38 C)] 98.6 F (37 C) (01/30 1200) Pulse Rate:  [78-121] 105 (01/30 1200) Cardiac Rhythm: Normal sinus rhythm;Sinus tachycardia (01/30 1200) Resp:  [9-36] 28 (01/30 1200) BP: (98-147)/(58-130) 122/81 (01/30 1200) SpO2:  [95 %-100 %] 100 % (01/30 1200) FiO2 (%):  [40 %] 40 % (01/30 1120) Weight:  [112.4 kg] 112.4 kg (01/30 0200)  Recent Labs  Lab 04/03/18 1941 04/03/18 2329 04/04/18 0310 04/04/18 0731 04/04/18 1122  GLUCAP 113* 143* 130* 146* 131*   Recent Labs  Lab 03/21/2018 0653 03/31/2018 1141  03/24/2018 1320 03/17/2018 1608  04/02/18 0449 04/02/18 1236 04/02/18 1648 04/03/18 0724 04/03/18 1637 04/04/18 0432 04/04/18 1045  NA 141 143   < > 142 136  --  139  --   --  141  --   --  139  K 4.1 4.2   < > 4.5 4.4  --  4.6  --   --  4.2  --   --  3.3*  CL 111  --   --   --  111  --  115*  --   --  118*  --   --  117*  CO2 21*  --   --   --  17*  --  18*  --   --  19*  --   --  17*  GLUCOSE 83 125*  --   --  165*  --  127*  --   --  138*  --   --  156*  BUN 16  --   --   --  13  --  15  --   --  32*  --   --  28*  CREATININE 1.30*  --   --   --  1.04*  --  1.05*  --   --  1.78*  --   --  0.97  CALCIUM 9.8  --   --   --  9.3  --  8.9  --   --  8.5*  --   --  8.6*  MG  --   --   --   --   --    < > 1.6* 1.4* 1.5* 1.6* 1.5* 1.9  --   PHOS  --   --   --   --   --    < > 4.1 3.8 3.9 3.2 1.9* 1.8*  --    < > = values in this interval not displayed.   No results for input(s): AST, ALT, ALKPHOS, BILITOT, PROT,  ALBUMIN in the last 168 hours. Recent Labs  Lab 03/29/2018 0653  03/15/2018 1608 03/06/2018 2022 04/02/18 0449 04/03/18 0730 04/04/18 0432  WBC 7.3  --  8.3 16.1* 15.6* 19.0* 7.5  NEUTROABS 4.9  --   --   --  13.1*  --   --   HGB 12.8   < > 11.2* 12.5 11.6* 9.9* 8.4*  HCT 42.9   < > 33.6* 38.4 34.7* 32.2* 26.5*  MCV 91.7  --  88.0 86.7 87.0 93.3 89.5  PLT 212  --  126* 159 152 115* 80*   < > = values in this interval not displayed.   Recent Labs  Lab 04/02/18 1236  CKTOTAL 103   Recent Labs    03/13/2018 1608  LABPROT 15.5*  INR 1.24   No results for input(s): COLORURINE, LABSPEC, PHURINE, GLUCOSEU, HGBUR, BILIRUBINUR, KETONESUR, PROTEINUR, UROBILINOGEN, NITRITE, LEUKOCYTESUR in the last 72 hours.  Invalid input(s): APPERANCEUR     Component Value Date/Time   CHOL 175 03/11/2018 0624   TRIG 155 (H) 03/11/2018 0624   HDL 48 03/11/2018 0624   CHOLHDL 3.6 03/11/2018 0624   VLDL 31 03/11/2018 0624   LDLCALC 96 03/11/2018 0624   Lab Results  Component Value Date   HGBA1C 4.7 (L) 03/11/2018      Component Value Date/Time   LABOPIA NONE DETECTED 03/10/2018 1210   COCAINSCRNUR NONE DETECTED 03/10/2018 1210   LABBENZ POSITIVE (A) 03/10/2018 1210   AMPHETMU NONE DETECTED 03/10/2018 1210   THCU NONE DETECTED 03/10/2018 1210   LABBARB NONE DETECTED 03/10/2018 1210    No results for input(s): ETH in the last 168 hours.  I have personally reviewed the radiological images below and agree with the radiology interpretations.  Ct Head Wo Contrast  Result Date: 04/02/2018 CLINICAL DATA:  Stroke, follow-up. EXAM: CT HEAD WITHOUT CONTRAST TECHNIQUE: Contiguous axial images were obtained from the base of the skull through the vertex without intravenous contrast. COMPARISON:  MRI brain 03/13/2018. FINDINGS: Brain: Bilateral watershed territory infarcts are confirmed as hypoattenuation involving the cortex, left greater than right. There is no associated hemorrhage. Left ACA stent is  stable in position. There is no hemorrhage or new infarct. Ventricles are of normal size. Insert pass fluid Vascular: ACA stent is in place. No significant atherosclerotic calcifications are present. There is no hyperdense vessel. Skull: Calvarium is intact. No focal lytic or blastic lesions are present. Sinuses/Orbits: The paranasal sinuses and mastoid air cells are clear. The globes and orbits are within normal limits. IMPRESSION: 1. Expected progression of bilateral watershed territory infarcts, left greater than right. 2. No new infarct territory or hemorrhage. 3. ACA stent. Electronically Signed   By: San Morelle M.D.   On: 04/02/2018 09:51   Ct Head Wo Contrast  Result Date: 03/10/2018 CLINICAL DATA:  Acute onset dizziness and difficulty walking today. EXAM: CT HEAD WITHOUT CONTRAST TECHNIQUE: Contiguous axial images were obtained from the base of the skull through the vertex without intravenous contrast. COMPARISON:  None. FINDINGS: Brain: No evidence of acute infarction, hemorrhage, hydrocephalus, extra-axial collection or mass lesion/mass effect. Vascular: No hyperdense vessel or unexpected calcification. Skull: Normal. Negative for fracture or focal lesion. Sinuses/Orbits: Negative. Other: None. IMPRESSION: Negative head CT. Electronically Signed   By: Inge Rise M.D.   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 Acute onset of right arm and leg weakness. EXAM: MRI HEAD WITHOUT CONTRAST MRA HEAD WITHOUT CONTRAST TECHNIQUE: Multiplanar, multiecho pulse sequences of the brain and surrounding structures were obtained without intravenous contrast. Angiographic images of the head were obtained using MRA technique without contrast. COMPARISON:  Angiographic images same day.  MRI 03/12/2018. FINDINGS: MRI HEAD FINDINGS Brain: The brain shows bilateral watershed  distribution cortical and subcortical edema and restricted diffusion, affecting both hemispheres but more extensive on the left than the right. The question is if this represents true bilateral watershed infarction or if it represents  a vaso regulatory disturbance related to spasm. No specific vascular territory infarction is seen. No sign of hemorrhage. Chronic small-vessel ischemic changes affect the brainstem and the cerebral hemispheric white matter as seen previously. No hydrocephalus. No extra-axial collection. Vascular: Major vessels at the base of the brain show flow. 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 There does appear to be a spasm appearance of the A1 vessel on the left and both anterior cerebral arteries, more extensively the right than the left. 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 No middle cerebral artery abnormality is seen. Posterior circulation vessels continued to show a normal appearance. IMPRESSION: Bilateral watershed distribution cortical and subcortical edema and restricted diffusion, more extensive on the left than the right. Differential diagnosis is true watershed infarction versus vaso regulatory disturbance secondary to arterial spasm, which is demonstrated in the anterior cerebral vessels by MR angiography. 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 Electronically Signed   By: Nelson Chimes M.D.   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 Acute onset of right arm and leg weakness. EXAM: MRI HEAD WITHOUT CONTRAST MRA HEAD WITHOUT CONTRAST TECHNIQUE: Multiplanar, multiecho pulse sequences of the brain and surrounding structures were obtained without  intravenous contrast. Angiographic images of the head were obtained using MRA technique without contrast. COMPARISON:  Angiographic images same day.  MRI 03/12/2018. FINDINGS: MRI HEAD FINDINGS Brain: The brain shows bilateral watershed distribution cortical and subcortical edema and restricted diffusion, affecting both hemispheres but more extensive on the left than the right. The question is if this represents true bilateral watershed infarction or if it represents a vaso regulatory disturbance related to spasm. No specific vascular territory infarction is seen. No sign of hemorrhage. Chronic small-vessel ischemic changes affect the brainstem and the cerebral hemispheric white matter as seen previously. No hydrocephalus. No extra-axial collection. Vascular: Major vessels at the base of the brain show flow. 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 There does appear to be a spasm appearance of the A1 vessel on the left and both anterior cerebral arteries, more extensively the right than the left. 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 No middle cerebral artery abnormality is seen. Posterior circulation vessels continued to show a normal appearance. IMPRESSION: Bilateral watershed distribution cortical and subcortical edema and restricted diffusion, more extensive on the left than the right. Differential diagnosis is true watershed infarction versus vaso regulatory disturbance secondary to arterial spasm, which is demonstrated in the anterior cerebral vessels by MR angiography. 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 Electronically Signed   By: Nelson Chimes M.D.   On: 03/31/2018 16:02   Mr Brain Wo Contrast  Result Date: 03/11/2018 CLINICAL DATA:  Headache, new, malignancy suspected. Dizziness and  weakness for 5 days EXAM: MRI HEAD WITHOUT CONTRAST MRA HEAD WITHOUT CONTRAST TECHNIQUE: Multiplanar, multiecho pulse sequences of the brain and surrounding structures were obtained without intravenous contrast. Angiographic images of the head were obtained using MRA technique without contrast. COMPARISON:  None. FINDINGS: MRI HEAD FINDINGS Brain: No acute infarction, hemorrhage, hydrocephalus, extra-axial collection or mass lesion. Mild patchy  FLAIR hyperintensity in the pons. Even milder periventricular FLAIR hyperintensity. Normal brain volume. Partially empty sella considered incidental in isolation. Vascular: Arterial findings below. Normal dural venous sinus flow voids. 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 No major branch occlusion or flow limiting stenosis. Negative for vessel beading. IMPRESSION: Brain MRI: 1. No acute finding or specific explanation for headache. 2. Mild signal abnormality in the pons and periventricular white matter that is usually from chronic small vessel ischemia. Intracranial MRA: 1. 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 2. No emergent finding. Electronically Signed   By: Monte Fantasia M.D.   On: 03/11/2018 09:08   Mr Brain W Contrast  Result Date: 03/12/2018 CLINICAL DATA:  Liver cancer. Headache. Rule out metastatic disease. EXAM: MRI HEAD WITH CONTRAST TECHNIQUE: Multiplanar, multiecho pulse sequences of the brain and surrounding structures were obtained with intravenous contrast. CONTRAST:  10 mL Gadovist IV COMPARISON:  Unenhanced MRI head 03/11/2018 FINDINGS: Normal enhancement. No enhancing mass lesion. Leptomeningeal enhancement normal. No acute abnormality. See recent unenhanced MRI report. IMPRESSION: No  acute abnormality and negative for intracranial metastatic disease. Electronically Signed   By: Franchot Gallo M.D.   On: 03/12/2018 11:16   US Carotid Bilateral (at [facility] And [facility] Only)  Result Date: 03/11/2018 CLINICAL DATA:  58 year old female with history of vertigo EXAM: BILATERAL CAROTID DUPLEX ULTRASOUND TECHNIQUE: Pearline Cables scale imaging, color Doppler and duplex ultrasound were performed of bilateral carotid and vertebral arteries in the neck. COMPARISON:  None. FINDINGS: Criteria: Quantification of carotid stenosis is based on velocity parameters that correlate the residual internal carotid diameter with NASCET-based stenosis levels, using the diameter of the distal internal carotid lumen as the denominator for stenosis measurement. The following velocity measurements were obtained: RIGHT ICA:  Systolic 91 cm/sec, Diastolic 34 cm/sec CCA:  132 cm/sec SYSTOLIC ICA/CCA RATIO:  0.9 ECA:  96 cm/sec LEFT ICA:  Systolic 78 cm/sec, Diastolic 38 cm/sec CCA:  93 cm/sec SYSTOLIC ICA/CCA RATIO:  0.8 ECA:  77 cm/sec Right Brachial SBP: Not acquired Left Brachial SBP: Not acquired RIGHT CAROTID ARTERY: No significant calcified disease of the right common carotid artery. Intermediate waveform maintained. Heterogeneous plaque without significant calcifications at the right carotid bifurcation. Low resistance waveform of the right ICA. No significant tortuosity. RIGHT VERTEBRAL ARTERY: Antegrade flow with low resistance waveform. LEFT CAROTID ARTERY: No significant calcified disease of the left common carotid artery. Intermediate waveform maintained. Heterogeneous plaque at the left carotid bifurcation without significant calcifications. Low resistance waveform of the left ICA. LEFT VERTEBRAL ARTERY:  Antegrade flow with low resistance waveform. Additional: Right thyroid nodule measures 1.8 cm. IMPRESSION: Color duplex indicates minimal heterogeneous plaque, with no hemodynamically significant stenosis by duplex criteria in  the extracranial cerebrovascular circulation. Incidental finding of right thyroid nodule measuring 1.8 cm. Dedicated thyroid ultrasound recommended if further characterisation warranted. Signed, Dulcy Fanny. Dellia Nims, RPVI [facility] Electronically Signed   By: Corrie Mckusick D.O.   On: 03/11/2018 09:41   Mr Jodene Nam Head/brain GM Cm  Result Date: 03/11/2018 CLINICAL DATA:  Headache, new, malignancy suspected. Dizziness and weakness for 5 days EXAM: MRI HEAD WITHOUT CONTRAST MRA HEAD WITHOUT CONTRAST TECHNIQUE: Multiplanar, multiecho pulse sequences of the brain and surrounding structures were obtained without  intravenous contrast. Angiographic images of the head were obtained using MRA technique without contrast. COMPARISON:  None. FINDINGS: MRI HEAD FINDINGS Brain: No acute infarction, hemorrhage, hydrocephalus, extra-axial collection or mass lesion. Mild patchy FLAIR hyperintensity in the pons. Even milder periventricular FLAIR hyperintensity. Normal brain volume. Partially empty sella considered incidental in isolation. Vascular: Arterial findings below. Normal dural venous sinus flow voids. 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 No major branch occlusion or flow limiting stenosis. Negative for vessel beading. IMPRESSION: Brain MRI: 1. No acute finding or specific explanation for headache. 2. Mild signal abnormality in the pons and periventricular white matter that is usually from chronic small vessel ischemia. Intracranial MRA: 1. 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 2. No emergent finding. Electronically Signed   By: Monte Fantasia M.D.   On: 03/11/2018 09:08   Mr Brain Wo Contrast  Result  Date: 04/03/2018 CLINICAL DATA:  Stroke follow-up EXAM: MRI HEAD WITHOUT CONTRAST TECHNIQUE: Multiplanar, multiecho pulse sequences of the brain and surrounding structures were obtained without intravenous contrast. COMPARISON:  Brain MRI 03/06/2018 and head CT 04/03/2018 FINDINGS: BRAIN: Compared to the prior MRI of 03/29/2018 there has been progression of bilateral watershed distribution infarcts, left-greater-than-right. The amount of ischemia is worst in the left frontal lobe. There is no midline shift or other mass effect. Partially empty sella. There is cytotoxic edema greatest in the superior anterior frontal lobes. Multifocal white matter hyperintensity, most commonly due to chronic ischemic microangiopathy. The cerebral and cerebellar volume are age-appropriate. Susceptibility-sensitive sequences show no chronic microhemorrhage or superficial siderosis. VASCULAR: Major intracranial arterial and venous sinus flow voids are normal. SKULL AND UPPER CERVICAL SPINE: Calvarial bone marrow signal is normal. There is no skull base mass. Visualized upper cervical spine and soft tissues are normal. SINUSES/ORBITS: No fluid levels or advanced mucosal thickening. No mastoid or middle ear effusion. The orbits are normal. IMPRESSION: 1. Worsening bilateral watershed distribution infarcts compared to MRI of 03/17/2018. Compared to the CT from earlier today, the distribution appears unchanged, allowing for differences in modality. 2. No hemorrhage or mass effect. Electronically Signed   By: Ulyses Jarred M.D.   On: 04/03/2018 20:33    PHYSICAL EXAM  Temp:  [98.5 F (36.9 C)-100.4 F (38 C)] 98.6 F (37 C) (01/30 1200) Pulse Rate:  [78-121] 105 (01/30 1200) Resp:  [9-36] 28 (01/30 1200) BP: (98-147)/(58-130) 122/81 (01/30 1200) SpO2:  [95 %-100 %] 100 % (01/30 1200) FiO2 (%):  [40 %] 40 % (01/30 1120) Weight:  [112.4 kg] 112.4 kg (01/30 0200)  General - Well nourished, well developed, intubated off  sedation.  Ophthalmologic - fundi not visualized due to noncooperation.  Cardiovascular - Regular rate and rhythm, tachycardia.  Neuro - intubated off sedation, eyes open with voice, but not following commands. Eeyes mid position, not blinking to visual threat bilaterally, but blinking spontaneously, doll's eyes present, not tracking, PERRL. Corneal reflex present bilaterally, gag and cough present. Breathing over the vent.  Facial symmetry not able to test due to ET tube.  Tongue midline in mouth. On pain stimulation, slight withdraw in all extremities.  Increased muscle tone on bilateral upper extremity, lateral extremities on extension position, BLEs increased muscle tone with extension position, and increased neck tone, difficulty with neck  flexion. DTR 1+ and positive bilateral babinski. Sensation, coordination and gait not tested.   ASSESSMENT/PLAN Ms. Carnell Casamento is a 58 y.o. female with history of CKD, CAD, OSA, liver tumor, cerebral aneurysm admitted for arm leg weakness found after aneurysm coiling. No tPA given due to right groin severe bleeding.    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  Ongoing aggressive stroke risk factor management  Therapy recommendations: Pending  Disposition: Pending, a long discussion with husband at bedside regarding [place] and DNR.  Husband needs time to think about  it.  CODE STATUS full code at this time  Vegetative state with increased muscle tone bilaterally  Patient developed diffuse increased muscle tone in all extremities and the neck  MRI showed bilateral ACA and MCA/ACA infarcts  MRI and repeat extensive bilateral ACA and MCA/ACA infarcts, progressed from prior MRI  EEG no seizure  Resume home Wellbutrin and Effexor as well as Topamax  Resume home Klonopin 0.5 mg twice daily  Supportive care   Hypotension   BP 100s  Now on neo   BP goal > 100  Taper off neo as able   AKI   Creatinine 1.04-1.05-1.78-0.97  On IVF with neo and RL  oligouria resolved  CCM on board  Close monitoring  ACOM aneurysm  Status post ACOM stent placement  On aspirin and Brilinta  Heparin IV discontinued  Dr.Deveshwar following  Right groin hematoma with severe anemia due to blood loss  Status post cerebral angiogram  Hemoglobin down from 12.8->6.1, received 4 units PRBC  hemoglobin 11.6-9.9-8.4  VVS on board  Status post right deep femoral artery repair -plan to remove drainage today  Close vascular check  Heparin IV discontinued  Continue aspirin and Brilinta  Hyperlipidemia  Home meds:  Zocor 20   LDL 96, goal < 70  Now on Zocor 40  Continue statin at discharge  Other Stroke Risk Factors  Obesity, Body mass index is 41.24 kg/m.   Obstructive sleep apnea, on CPAP at home  Other Active Problems  Leukocytosis 16.1-15.6-19.0-7.5  Hospital day # 3  This patient is critically ill due to bilateral ACA, MCA/ACA territory infarcts, severe anemia requiring blood transfusion, groin hematoma with femoral artery repair, hypotension, leukocytosis and at significant risk of neurological worsening, death form recurrent stroke, hemorrhagic conversion, seizure, hypovolemic shock, sepsis. This patient's care requires constant monitoring of vital signs, hemodynamics, respiratory and cardiac monitoring, review of multiple  databases, neurological assessment, discussion with family, other specialists and medical decision making of high complexity. I spent 45 minutes of neurocritical care time in the care of this patient. I had long discussion with husband at bedside, updated pt current condition, treatment plan and potential poor prognosis. He expressed understanding and appreciation.  Husband would like to think about [place] and DNR and let us know.  I also discussed with Dr. Estanislado Pandy.  Rosalin Hawking, MD PhD Stroke Neurology 04/04/2018 12:39 PM  To contact Stroke Continuity provider, please refer to [email or web address] After hours, contact General Neurology

## 2018-04-05 ENCOUNTER — Inpatient Hospital Stay (HOSPITAL_COMMUNITY): Payer: Medicare Other

## 2018-04-05 DIAGNOSIS — R509 Fever, unspecified: Secondary | ICD-10-CM

## 2018-04-05 LAB — BASIC METABOLIC PANEL
ANION GAP: 5 (ref 5–15)
BUN: 25 mg/dL — ABNORMAL HIGH (ref 6–20)
CALCIUM: 8.6 mg/dL — AB (ref 8.9–10.3)
CO2: 22 mmol/L (ref 22–32)
CREATININE: 0.85 mg/dL (ref 0.44–1.00)
Chloride: 113 mmol/L — ABNORMAL HIGH (ref 98–111)
GFR calc non Af Amer: >60 mL/min (ref >60)
Glucose, Bld: 145 mg/dL — ABNORMAL HIGH (ref 70–99)
Potassium: 3.1 mmol/L — ABNORMAL LOW (ref 3.5–5.1)
Sodium: 140 mmol/L (ref 135–145)

## 2018-04-05 LAB — URINALYSIS, COMPLETE (UACMP) WITH MICROSCOPIC
Bacteria, UA: NONE SEEN
Bilirubin Urine: NEGATIVE
Glucose, UA: NEGATIVE mg/dL
Hgb urine dipstick: NEGATIVE
Ketones, ur: NEGATIVE mg/dL
Leukocytes, UA: NEGATIVE
NITRITE: NEGATIVE
Protein, ur: 30 mg/dL — AB
Specific Gravity, Urine: 1.017 (ref 1.005–1.030)
pH: 6 (ref 5.0–8.0)

## 2018-04-05 LAB — GLUCOSE, CAPILLARY
GLUCOSE-CAPILLARY: 121 mg/dL — AB (ref 70–99)
Glucose-Capillary: 123 mg/dL — ABNORMAL HIGH (ref 70–99)
Glucose-Capillary: 145 mg/dL — ABNORMAL HIGH (ref 70–99)
Glucose-Capillary: 143 mg/dL — ABNORMAL HIGH (ref 70–99)
Glucose-Capillary: 115 mg/dL — ABNORMAL HIGH (ref 70–99)
Glucose-Capillary: 125 mg/dL — ABNORMAL HIGH (ref 70–99)

## 2018-04-05 LAB — CBC
HCT: 21.8 % — ABNORMAL LOW (ref 36.0–46.0)
HCT: 24.5 % — ABNORMAL LOW (ref 36.0–46.0)
Hemoglobin: 7 g/dL — ABNORMAL LOW (ref 12.0–15.0)
Hemoglobin: 8 g/dL — ABNORMAL LOW (ref 12.0–15.0)
MCH: 28.2 pg (ref 26.0–34.0)
MCH: 28.4 pg (ref 26.0–34.0)
MCHC: 32.1 g/dL (ref 30.0–36.0)
MCHC: 32.7 g/dL (ref 30.0–36.0)
MCV: 87.9 fL (ref 80.0–100.0)
MCV: 86.9 fL (ref 80.0–100.0)
Platelets: 89 10*3/uL — ABNORMAL LOW (ref 150–400)
Platelets: 94 10*3/uL — ABNORMAL LOW (ref 150–400)
RBC: 2.48 MIL/uL — ABNORMAL LOW (ref 3.87–5.11)
RBC: 2.82 MIL/uL — ABNORMAL LOW (ref 3.87–5.11)
RDW: 14.4 % (ref 11.5–15.5)
RDW: 14.2 % (ref 11.5–15.5)
WBC: 6.5 10*3/uL (ref 4.0–10.5)
WBC: 7.3 10*3/uL (ref 4.0–10.5)
nRBC: 0.5 % — ABNORMAL HIGH (ref 0.0–0.2)
nRBC: 1.1 % — ABNORMAL HIGH (ref 0.0–0.2)

## 2018-04-05 LAB — PHOSPHORUS
PHOSPHORUS: 2.3 mg/dL — AB (ref 2.5–4.6)
Phosphorus: 1.7 mg/dL — ABNORMAL LOW (ref 2.5–4.6)

## 2018-04-05 LAB — MAGNESIUM
Magnesium: 1.6 mg/dL — ABNORMAL LOW (ref 1.7–2.4)
Magnesium: 1.9 mg/dL (ref 1.7–2.4)

## 2018-04-05 LAB — HEMOGLOBIN AND HEMATOCRIT, BLOOD
HCT: 20.5 % — ABNORMAL LOW (ref 36.0–46.0)
Hemoglobin: 6.8 g/dL — CL (ref 12.0–15.0)

## 2018-04-05 LAB — PREPARE RBC (CROSSMATCH)

## 2018-04-05 LAB — OCCULT BLOOD X 1 CARD TO LAB, STOOL: Fecal Occult Bld: NEGATIVE

## 2018-04-05 MED ORDER — MIDAZOLAM HCL 2 MG/2ML IJ SOLN
1.0000 mg | INTRAMUSCULAR | Status: DC | PRN
  Administered 2018-04-05 – 2018-04-06 (×6): 1 mg via INTRAVENOUS
  Filled 2018-04-05 (×6): qty 2

## 2018-04-05 MED ORDER — FENTANYL 2500MCG IN NS 250ML (10MCG/ML) PREMIX INFUSION
0.0000 ug/h | INTRAVENOUS | Status: DC
  Administered 2018-04-05: 25 ug/h via INTRAVENOUS
  Administered 2018-04-06: 125 ug/h via INTRAVENOUS
  Administered 2018-04-07 (×2): 150 ug/h via INTRAVENOUS
  Filled 2018-04-05 (×4): qty 250

## 2018-04-05 MED ORDER — POTASSIUM PHOSPHATES 15 MMOLE/5ML IV SOLN
30.0000 mmol | Freq: Once | INTRAVENOUS | Status: AC
  Administered 2018-04-05: 30 mmol via INTRAVENOUS
  Filled 2018-04-05: qty 10

## 2018-04-05 MED ORDER — PHENYLEPHRINE HCL-NACL 10-0.9 MG/250ML-% IV SOLN
0.0000 ug/min | INTRAVENOUS | Status: DC
  Administered 2018-04-05: 20 ug/min via INTRAVENOUS
  Administered 2018-04-06: 35 ug/min via INTRAVENOUS
  Administered 2018-04-06: 50 ug/min via INTRAVENOUS
  Administered 2018-04-06: 25 ug/min via INTRAVENOUS
  Administered 2018-04-06 – 2018-04-07 (×2): 30 ug/min via INTRAVENOUS
  Administered 2018-04-07: 35 ug/min via INTRAVENOUS
  Administered 2018-04-07: 30 ug/min via INTRAVENOUS
  Administered 2018-04-07: 70 ug/min via INTRAVENOUS
  Administered 2018-04-07: 30 ug/min via INTRAVENOUS
  Administered 2018-04-08: 20 ug/min via INTRAVENOUS
  Administered 2018-04-08: 30 ug/min via INTRAVENOUS
  Filled 2018-04-05 (×7): qty 250
  Filled 2018-04-05: qty 500
  Filled 2018-04-05 (×4): qty 250

## 2018-04-05 MED ORDER — MAGNESIUM SULFATE 2 GM/50ML IV SOLN
2.0000 g | Freq: Once | INTRAVENOUS | Status: AC
  Administered 2018-04-05: 2 g via INTRAVENOUS
  Filled 2018-04-05: qty 50

## 2018-04-05 MED ORDER — BISACODYL 10 MG RE SUPP
10.0000 mg | Freq: Once | RECTAL | Status: AC
  Administered 2018-04-05: 10 mg via RECTAL
  Filled 2018-04-05: qty 1

## 2018-04-05 MED ORDER — MIDAZOLAM HCL 2 MG/2ML IJ SOLN
1.0000 mg | Freq: Four times a day (QID) | INTRAMUSCULAR | Status: DC | PRN
  Administered 2018-04-05: 1 mg via INTRAVENOUS
  Filled 2018-04-05: qty 2

## 2018-04-05 MED ORDER — SODIUM CHLORIDE 0.9 % IV SOLN
INTRAVENOUS | Status: DC | PRN
  Administered 2018-04-05 – 2018-04-06 (×2): 250 mL via INTRAVENOUS

## 2018-04-05 MED ORDER — FENTANYL BOLUS VIA INFUSION
50.0000 ug | INTRAVENOUS | Status: DC | PRN
  Administered 2018-04-06 (×3): 50 ug via INTRAVENOUS
  Filled 2018-04-05: qty 50

## 2018-04-05 MED ORDER — QUETIAPINE FUMARATE 25 MG PO TABS
25.0000 mg | ORAL_TABLET | Freq: Two times a day (BID) | ORAL | Status: DC
  Administered 2018-04-05 – 2018-04-07 (×5): 25 mg
  Filled 2018-04-05 (×5): qty 1

## 2018-04-05 NOTE — Care Management Note (Signed)
Case Management Note  Patient Details  Name: Bonna Steury MRN: [id number] Date of Birth: 1960/08/31  Subjective/Objective:  Pt admitted on 03/09/2018 with cerebral aneurysm for embolization and coiling.  Pt developed bifrontal CVA and Rt groin hematoma postoperatively.  PTA, pt independent, lives with spouse.                    Action/Plan: Pt remains intubated currently on full support.  Will continue to follow as pt progresses.  Expected Discharge Date:                  Expected Discharge Plan:     In-House Referral:     Discharge planning Services  CM Consult  Post Acute Care Choice:    Choice offered to:     DME Arranged:    DME Agency:     HH Arranged:    HH Agency:     Status of Service:  In process, will continue to follow  If discussed at Long Length of Stay Meetings, dates discussed:    Additional Comments:  Reinaldo Raddle, RN, BSN  Trauma/Neuro ICU Case Manager [phone]

## 2018-04-05 NOTE — Progress Notes (Signed)
NAME:  Rebecca Cox, MRN:  [id number], DOB:  09-17-1960, LOS: 4 ADMISSION DATE:  04/02/2018, CONSULTATION DATE: 03/25/2018 REFERRING MD:  Patrecia Pour, CHIEF COMPLAINT: Acute respiratory failure  Brief History   This is a 58 year old female with history of liver cancer and cerebral aneurysm who presents to PCCM for vent management postop.  Patient have the aneurysm coiled in during the procedure developed a bifrontal CVA and a right groin hematoma.  She returned to the OR for evacuation of the hematoma and repair of the vessel.  Heparin was reversed and patient was transfused 2 units of packed red blood cells.  Past Medical History   Past Medical History:  Diagnosis Date  . Agoraphobia   . Arthritis   . Cancer ([place])    liver  . Cerebral aneurysm   . Chronic kidney disease    stage 3  . Coronary artery disease   . Depression   . Diverticulitis   . Esophageal hernia   . Family history of adverse reaction to anesthesia    Pt suspects that " mother had cardiac arrest from anesthesia- not exactly sure"   . GERD (gastroesophageal reflux disease)   . Headache   . Sleep apnea    does not wear CPAP  . Thyroid nodule   . Wears glasses   . Wears partial dentures    Significant Hospital Events   1/27 > anterior communicating aneurysm embolization, post procedure code stroke, right femoral hematoma  Consults:  PCCM Neurology Vascular Surgery IR  Procedures:  1/27 >  anterior communicating aneurysm embolization 1/27 > ETT 1/27 R aline >> 1/28  Significant Diagnostic Tests:  MR I 03/24/2018 >>  Bilateral watershed distribution cortical and subcortical edema and restricted diffusion, more extensive on the left than the right.  Differential diagnosis is true watershed infarction versus vaso regulatory disturbance secondary to arterial spasm, which is demonstrated in the anterior cerebral vessels by MR angiography. Previously seen 5 mm anterior communicating region aneurysm remains  visible.  1/28 CTH >> 1. Expected progression of bilateral watershed territory infarcts, left greater than right. 2. No new infarct territory or hemorrhage. 3. ACA stent.  1/28 EEG> abnormal EEG, generalized background slowing. No epileptiform activity.   1/29 CT Head>  Stable watershed distribution bilaterally, new acute intracranial abnormality   1/29 MRI brain> worsening watershed infarcts compared to 1/27 MRI. No mass effect, no hemorrhage.   1/30 ECHO> LVEF 60-65%, no wall motion abnormality CXR 1/31> bilateral opacities; pulm edema pattern  Micro Data:  1/27 MRSA PCR >>neg  Antimicrobials:   1/27 cefazolin preop  Interim history/subjective:  Off neo. Remains sedated, intubated, with some increased signs of agitation this morning.  Increased temperature this morning  (100.9) without leukocytosis   Objective   Blood pressure 119/78, pulse 97, temperature 99 F (37.2 C), temperature source Oral, resp. rate (!) 29, height 5\' 5"  (1.651 m), weight 113.8 kg, SpO2 95 %.    Vent Mode: PSV;CPAP FiO2 (%):  [30 %-40 %] 30 % Set Rate:  [16 bmp] 16 bmp Vt Set:  [450 mL] 450 mL PEEP:  [5 cmH20] 5 cmH20 Pressure Support:  [5 cmH20] 5 cmH20 Plateau Pressure:  [12 cmH20-15 cmH20] 12 cmH20   Intake/Output Summary (Last 24 hours) at 04/05/2018 0826 Last data filed at 04/05/2018 0700 Gross per 24 hour  Intake 3501.57 ml  Output 1650 ml  Net 1851.57 ml   Filed Weights   03/22/2018 0621 04/04/18 0200 04/05/18 0500  Weight: 95.3 kg 112.4 kg  113.8 kg    Examination: General:  Obese adult female, intubated, sedated.  HEENT: NCAT. ETT OGT secure. Trachea midline. MMM  Neuro: Sedated. Opens eyes spontaneously. Increased muscle tone BUE BLE with contractures of bilateral hands. PERRL 14mm.  CV: RRR no r/g/m 2+ radial pulses. Capillary refill BUE BLE < 3 seconds.  PULM: Mechanically ventilated. Scattered crackles bilaterally, no wheezing.   GI: Obese, soft, hypoactive x4 Extremities:  Scattered ecchymosis, increasing around site where JP drain removed Skin: Pale, clean, dry, warm, intact.   Resolved Hospital Problem list   AKI  Assessment & Plan:     ACOM aneurysm s/p embolization -Intraoperative bilateral ACA CVA, L> R -ACA and MCA/MCA watershed infarcts - CT head 1/29 no progression of watershed injury -EEG 1/28 no epileptiform activity, generalized background slowing P:  Neurology following, appreciate recs  Home meds as per neurology Continue ASA and brilinta per Dr. Alexandria Lodge  Continue serial neuro exams SBP goal > 110 per neuro  GOC per primary    Acute respiratory insufficiency requiring mechanical ventilation -post operative insufficiency, contributing factors include CVA, sedation -1/31 CXR bilateral opacities, possible pulm edema.  P:  -SBT qAM -Follow up CXR tomorrow AM  -Continue vent support.  -PRVC when not SBT, Titrate PEEP/FiO2 for goal SpO2>94%\  -Continue Precedex gtt, PRN fentanyl and PRN versed.   (1/31) Adding Seroquel 25mg  BID, check qtc qD  -Continue CPT, pulm hygeine -Husband still thinking about if patient would want trach as it pertains to patient's [place].   Right groin hematoma, s/p repair  -s/p 2 PRBC 1/27 -JP drain removed 1/30 -new acute anemia 1/31, outlined below P:  Vascular surgery following and managing  Monitor site for increased bleeding, bruising, hematoma Trend H/H Plan for acute anemia outlined below  Acute Anemia (1/31) -etiology hemodilution related to IVF vs acute blood loss  -Hgb 7 from 8.4 s/p JP drain removal, RN noted oozing around removal site late 1/30.  P Monitor site for bleeding, oozing, increased ecchymosis, hematoma Rechecking H/H at 10am today and will evaluate trend at that time.  If stable or increasing Hgb, fvor no transfusion at this time as patient is not hypotensive, no signs of continued blood loss at site and is oxygenating.  If Hgb dropping on follow up, if becomes hypotensive, if  new signs of bleeding, favor transfusing 2PRBC   Electrolyte abnormality -Hypomagnesemia -Hypophosphatemia    -Hypokalemia P:  -Replaced Mag, phos, potassium -Trend BMP mag phos  -Replace PRN   Diabetes Mellitus  P:  Continue q4 CBG No SSI at this time, start SSI if > 180  Malnutrition, at risk P Continue enteral nutrition   Best practice:  Diet: Enteral nutrition Pain/Anxiety/Delirium protocol (if indicated): Precedex gtt, adding seroquel BID, PRN fent and PRN versed VAP protocol (if indicated): Yes DVT prophylaxis:  SCD, SQ Heparin  GI prophylaxis: protonix Glucose control: q4CBG Mobility: Bedrest Code Status: Full  Family Communication: No family at bedside at time of NP exam.  Disposition:  Continue ICU level of care   Labs   CBC:  Recent Labs  Lab 03/31/2018 0653  03/13/2018 2022 04/02/18 0449 04/03/18 0730 04/04/18 0432 04/05/18 0553  WBC 7.3   < > 16.1* 15.6* 19.0* 7.5 6.5  NEUTROABS 4.9  --   --  13.1*  --   --   --   HGB 12.8   < > 12.5 11.6* 9.9* 8.4* 7.0*  HCT 42.9   < > 38.4 34.7* 32.2* 26.5* 21.8*  MCV 91.7   < >  86.7 87.0 93.3 89.5 87.9  PLT 212   < > 159 152 115* 80* 89*   < > = values in this interval not displayed.    Basic Metabolic Panel: Recent Labs  Lab 03/24/2018 1608 04/02/18 0449  04/03/18 0724 04/03/18 1637 04/04/18 0432 04/04/18 1045 04/04/18 1748 04/05/18 0553  NA 136 139  --  141  --   --  139  --  140  K 4.4 4.6  --  4.2  --   --  3.3*  --  3.1*  CL 111 115*  --  118*  --   --  117*  --  113*  CO2 17* 18*  --  19*  --   --  17*  --  22  GLUCOSE 165* 127*  --  138*  --   --  156*  --  145*  BUN 13 15  --  32*  --   --  28*  --  25*  CREATININE 1.04* 1.05*  --  1.78*  --   --  0.97  --  0.85  CALCIUM 9.3 8.9  --  8.5*  --   --  8.6*  --  8.6*  MG  --  1.6*   < > 1.6* 1.5* 1.9  --  1.7 1.6*  PHOS  --  4.1   < > 3.2 1.9* 1.8*  --  1.6* 1.7*   < > = values in this interval not displayed.   GFR: Estimated Creatinine  Clearance: 94.1 mL/min (by C-G formula based on SCr of 0.85 mg/dL). Recent Labs  Lab 04/02/18 0449 04/03/18 0730 04/04/18 0432 04/05/18 0553  WBC 15.6* 19.0* 7.5 6.5    Liver Function Tests: No results for input(s): AST, ALT, ALKPHOS, BILITOT, PROT, ALBUMIN in the last 168 hours. No results for input(s): LIPASE, AMYLASE in the last 168 hours. No results for input(s): AMMONIA in the last 168 hours.  ABG    Component Value Date/Time   PHART 7.318 (L) 04/02/2018 0413   PCO2ART 37.9 04/02/2018 0413   PO2ART 166 (H) 04/02/2018 0413   HCO3 18.9 (L) 04/02/2018 0413   TCO2 20 (L) 03/21/2018 1320   ACIDBASEDEF 6.1 (H) 04/02/2018 0413   O2SAT 98.6 04/02/2018 0413     Coagulation Profile: Recent Labs  Lab 03/10/2018 0653 03/18/2018 1608  INR 1.03 1.24    Cardiac Enzymes: Recent Labs  Lab 04/02/18 1236  CKTOTAL 103    HbA1C: Hgb A1c MFr Bld  Date/Time Value Ref Range Status  03/11/2018 06:22 AM 4.7 (L) 4.8 - 5.6 % Final    Comment:    (NOTE) Pre diabetes:          5.7%-6.4% Diabetes:              >6.4% Glycemic control for   <7.0% adults with diabetes     CBG: Recent Labs  Lab 04/04/18 1122 04/04/18 1512 04/04/18 1922 04/04/18 2300 04/05/18 0335  GLUCAP 131* 143* 164* 141* 121*   Critical Care time 45 minutes  Eliseo Gum MSN, AGACNP-BC [facility] 04/05/2018, 9:24 AM

## 2018-04-05 NOTE — Progress Notes (Addendum)
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 Estanislado Pandy. Patient laying in bed intubated and sedation. No family at bedside. She opens eyes to voice but does not follow commands. She demonstrates increased muscle tone in all extremities and neck but does have spontaneous movements of lower extremities (can flex knees, R>L) and is biting at her ET tube. Right groin incision c/d/i with wound vac intact.   Allergies: Patient has no known allergies.  Medications: Prior to Admission medications   Medication Sig Start Date End Date Taking? Authorizing Provider  aspirin 325 MG EC tablet Take 325 mg by mouth daily. " And as needed if headache continues"   Yes [provider]  B-COMPLEX-C PO Take 1 tablet by mouth daily.   Yes [provider]  buPROPion (WELLBUTRIN SR) 150 MG 12 hr tablet Take 150 mg by mouth 2 (two) times daily.   Yes [provider]  celecoxib (CELEBREX) 200 MG capsule Take 200 mg by mouth daily. 5pm   Yes [provider]  clonazePAM (KLONOPIN) 0.5 MG tablet Take 0.5 mg by mouth 2 (two) times daily. 1 tablet in the morning and take 1 tablet at 5p. May take additional half-whole tablet throughout the day as needed for anxiety   Yes [provider]  clopidogrel (PLAVIX) 75 MG tablet Take 75 mg by mouth daily.   Yes [provider]  metoprolol succinate (TOPROL-XL) 25 MG 24 hr tablet Take 12.5 mg by mouth every morning.   Yes [provider]  pantoprazole (PROTONIX) 40 MG tablet Take 40 mg by mouth every morning.   Yes [provider]  polycarbophil (FIBERCON) 625 MG tablet Take 625 mg by mouth 2 (two) times daily.   Yes [provider]  simvastatin (ZOCOR) 20 MG tablet Take 20 mg by mouth every morning.   Yes  [provider]  topiramate (TOPAMAX) 25 MG tablet Take 25 mg by mouth daily. 57m at night for one week then 50 mg at night thereafter   Yes [provider]  venlafaxine XR (EFFEXOR-XR) 150 MG 24 hr capsule Take 150 mg by mouth 2 (two) times daily.   Yes [provider]     Vital Signs: BP 119/78   Pulse 97   Temp 99 F (37.2 C) (Oral)   Resp (!) 29   Ht _0  (1.651 m)   Wt 250 lb 14.1 oz (113.8 kg)   SpO2 95%   BMI 41.75 kg/m   Physical Exam Vitals signs and nursing note reviewed.  Constitutional:      General: She is not in acute distress.    Appearance: Normal appearance.     Comments: Intubated and sedated.  Pulmonary:     Effort: Pulmonary effort is normal. No respiratory distress.     Comments: Intubated and sedated. Skin:    General: Skin is warm and dry.     Comments: Right groin incision soft with wound vac intact, no active bleeding or hematoma.  Neurological:     Comments: Intubated and sedation. She opens eyes to voice but does not follow commands. PERRL bilaterally. EOMs not assessed. Visual fields not assessed. Unable to assess facial asymmetry due to ET tube. Unable to assess tongue protrusion due to ET tube. Demonstrates increased muscle tone in all extremities and neck but does have spontaneous  movements of lower extremities (can flex knees, R>L) and is biting at her ET tube. Pronator drift not assessed. Fine motor and coordination not assessed. Gait not assessed. Romberg not assessed. Heel to toe not assessed. Distal pulses 1+ bilaterally.  Psychiatric:     Comments: Intubated and sedated.     Imaging: Ct Head Wo Contrast  Result Date: 04/03/2018 CLINICAL DATA:  58 y/o  F; follow-up of stroke. EXAM: CT HEAD WITHOUT CONTRAST TECHNIQUE: Contiguous axial images were obtained from the base of the skull through the vertex without intravenous contrast. COMPARISON:  04/02/2018 CT head. FINDINGS: Brain: Stable distribution of  bilateral watershed distribution infarcts. Mild interval increase in edema and local mass effect. No herniation. No new stroke, hemorrhage, extra-axial collection, or hydrocephalus. Empty sella turcica. Vascular: ACA stent noted. Calcific atherosclerosis of carotid siphons. No new hyperdense vessel. Skull: Normal. Negative for fracture or focal lesion. Sinuses/Orbits: No acute finding. Other: None. IMPRESSION: 1. 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 Mild interval increase in edema and local mass effect. No herniation. 2. No new acute intracranial abnormality identified. Electronically Signed   By: Kristine Garbe M.D.   On: 04/03/2018 03:22   Ct Head Wo Contrast  Result Date: 04/02/2018 CLINICAL DATA:  Stroke, follow-up. EXAM: CT HEAD WITHOUT CONTRAST TECHNIQUE: Contiguous axial images were obtained from the base of the skull through the vertex without intravenous contrast. COMPARISON:  MRI brain 03/11/2018. FINDINGS: Brain: Bilateral watershed territory infarcts are confirmed as hypoattenuation involving the cortex, left greater than right. There is no associated hemorrhage. Left ACA stent is stable in position. There is no hemorrhage or new infarct. Ventricles are of normal size. Insert pass fluid Vascular: ACA stent is in place. No significant atherosclerotic calcifications are present. There is no hyperdense vessel. Skull: Calvarium is intact. No focal lytic or blastic lesions are present. Sinuses/Orbits: The paranasal sinuses and mastoid air cells are clear. The globes and orbits are within normal limits. IMPRESSION: 1. Expected progression of bilateral watershed territory infarcts, left greater than right. 2. No new infarct territory or hemorrhage. 3. ACA stent. Electronically Signed   By: San Morelle M.D.   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 Acute onset of right arm and leg weakness. EXAM: MRI HEAD WITHOUT CONTRAST MRA HEAD WITHOUT CONTRAST TECHNIQUE: Multiplanar, multiecho pulse sequences of the brain and surrounding structures were obtained without intravenous contrast. Angiographic images of the head were obtained using MRA technique without contrast. COMPARISON:  Angiographic images same day.  MRI 03/12/2018. FINDINGS: MRI HEAD FINDINGS Brain: The brain shows bilateral watershed distribution cortical and subcortical edema and restricted diffusion, affecting both hemispheres but more extensive on the left than the right. The question is if this represents true bilateral watershed infarction or if it represents a vaso regulatory disturbance related to spasm. No specific vascular territory infarction is seen. No sign of hemorrhage. Chronic small-vessel ischemic changes affect the brainstem and the cerebral hemispheric white matter as seen previously. No hydrocephalus. No extra-axial collection. Vascular: Major vessels at the base of the brain show flow. 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 There does appear to be a spasm appearance of the A1 vessel on the left and both anterior cerebral arteries, more extensively the right than the left. 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 No middle cerebral artery abnormality is seen. Posterior circulation vessels continued to show a normal appearance. IMPRESSION: Bilateral watershed distribution cortical and subcortical edema and restricted diffusion, more extensive on the left than the right. Differential diagnosis is true watershed infarction versus vaso regulatory disturbance secondary to arterial spasm, which is demonstrated in the anterior cerebral vessels by MR angiography. Previously seen 5 mm anterior  communicating region aneurysm remains visible. Electronically Signed   By: Nelson Chimes M.D.   On: 03/26/2018 16:02   Mr Brain Wo Contrast  Result Date: 04/03/2018 CLINICAL DATA:  Stroke follow-up EXAM: MRI HEAD WITHOUT CONTRAST TECHNIQUE: Multiplanar, multiecho pulse sequences of the brain and surrounding structures were obtained without intravenous contrast. COMPARISON:  Brain MRI 03/30/2018 and head CT 04/03/2018 FINDINGS: BRAIN: Compared to the prior MRI of 04/03/2018 there has been progression of bilateral watershed distribution infarcts, left-greater-than-right. The amount of ischemia is worst in the left frontal lobe. There is no midline shift or other mass effect. Partially empty sella. There is cytotoxic edema greatest in the superior anterior frontal lobes. Multifocal white matter hyperintensity, most commonly due to chronic ischemic microangiopathy. The cerebral and cerebellar volume are age-appropriate. Susceptibility-sensitive sequences show no chronic microhemorrhage or superficial siderosis. VASCULAR: Major intracranial arterial and venous sinus flow voids are normal. SKULL AND UPPER CERVICAL SPINE: Calvarial bone marrow signal is normal. There is no skull base mass. Visualized upper cervical spine and soft tissues are normal. SINUSES/ORBITS: No fluid levels or advanced mucosal thickening. No mastoid or middle ear effusion. The orbits are normal. IMPRESSION: 1. Worsening bilateral watershed distribution infarcts compared to MRI of 03/19/2018. Compared to the CT from earlier today, the distribution appears unchanged, allowing for differences in modality. 2. No hemorrhage or mass effect. Electronically Signed   By: Ulyses Jarred M.D.   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 Acute onset of right arm and leg weakness. EXAM: MRI HEAD WITHOUT CONTRAST MRA HEAD WITHOUT CONTRAST  TECHNIQUE: Multiplanar, multiecho pulse sequences of the brain and surrounding structures were obtained without intravenous contrast. Angiographic images of the head were obtained using MRA technique without contrast. COMPARISON:  Angiographic images same day.  MRI 03/12/2018. FINDINGS: MRI HEAD FINDINGS Brain: The brain shows bilateral watershed distribution cortical and subcortical edema and restricted diffusion, affecting both hemispheres but more extensive on the left than the right. The question is if this represents true bilateral watershed infarction or if it represents a vaso regulatory disturbance related to spasm. No specific vascular territory infarction is seen. No sign of hemorrhage. Chronic small-vessel ischemic changes affect the brainstem and the cerebral hemispheric white matter as seen previously. No hydrocephalus. No extra-axial collection. Vascular: Major vessels at the base of the brain show flow. 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 There does appear to be a spasm appearance of the A1 vessel on the left and both anterior cerebral arteries, more extensively the right than the left. 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 No middle cerebral artery abnormality is seen. Posterior circulation vessels continued to show a normal appearance. IMPRESSION: Bilateral watershed distribution cortical and subcortical edema and restricted diffusion, more extensive on the left than the right. Differential diagnosis is true watershed infarction versus  vaso regulatory disturbance secondary to arterial spasm, which is demonstrated in the anterior cerebral vessels by MR angiography. 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 Electronically Signed   By: Nelson Chimes M.D.   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 EXAM: TRANSCATHETER THERAPY EMBOLIZATION; IR ANGIO INTRA EXTRACRAN SEL INTERNAL CAROTID UNI LEFT MOD SED; RADIOLOGY EXAMINATION; IR ANGIO INTRA EXTRACRAN SEL COM CAROTID INNOMINATE UNI RIGHT MOD SED; IR ANGIO VERTEBRAL SEL VERTEBRAL BILAT MOD SED COMPARISON:  MRI MRA of the brain of 03/12/2018. MEDICATIONS: Heparin 4,500 units IV; Ancef 2 g IV. The antibiotic was administered within 1 hour of the procedure. ANESTHESIA/SEDATION: Mac anesthesia for the diagnostic portion, and general anesthesia for the treatment portion of the procedure provided by the Department of Anesthesiology at [place]:  Isovue 300 approximately 140 mL. FLUOROSCOPY TIME:  Fluoroscopy Time: 42 minutes 12 seconds (2076 mGy). COMPLICATIONS: None immediate. TECHNIQUE: Informed written consent was obtained from the patient after a thorough discussion of the procedural risks, benefits and alternatives. All questions were addressed. Maximal Sterile Barrier Technique was utilized including caps, mask, sterile gowns, sterile gloves, sterile drape, hand hygiene and skin antiseptic. A timeout was performed prior to the initiation of the procedure. The right groin was prepped and draped in the usual sterile fashion. Thereafter using modified Seldinger technique, transfemoral access into the right common femoral artery was obtained without difficulty. Over a 0.035 inch guidewire, a 5 French Pinnacle sheath was inserted. Through this, and also over 0.035 inch guidewire, a 5 [place] JB 1 catheter was advanced to the aortic arch region and selectively positioned in the right common carotid artery, the right vertebral artery, the left common carotid artery and the left vertebral artery. FINDINGS: The right common carotid arteriogram  demonstrates the right external carotid artery and its major branches to be widely patent. The right internal carotid artery at the bulb to the cranial skull base also demonstrates wide patency. However, there are focal segmental area of small outpouchings without intraluminal narrowing in the distal 1/3 of the right internal carotid artery most compatible with mild fibromuscular dysplasia. Distal to this the cervical petrous junction is widely patent. The petrous, cavernous and supraclinoid segments demonstrate wide patency. The right middle cerebral artery is noted to opacify into the capillary and venous phases. 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 The origin the right vertebral artery is widely patent. The vessel is seen to opacify to the cranial skull base. At the level of the cranial skull base there is a focal area of mild stenosis. Distal to this the vessel assumes normal caliber. The opacified portions of the basilar artery, the superior cerebellar arteries and the anterior-inferior cerebellar arteries is normal into the capillary and venous phases. There is flash filling of the posterior cerebral arteries with unopacified blood in the distal half of the basilar artery from the contralateral vertebral artery. The left vertebral artery origin is widely patent. The vessel opacifies normally to the cranial skull base. There is mild tortuosity in its proximal half. The left vertebrobasilar junction and the left posterior-inferior cerebellar artery demonstrate normal opacification. The basilar artery, the posterior cerebral arteries, the superior cerebellar arteries and the anterior-inferior cerebellar arteries opacify into the capillary and venous phases. A left common carotid arteriogram  demonstrates the left external carotid artery and its major branches to be widely patent. The left internal carotid artery at the bulb to the cranial skull base demonstrates  wide patency. The petrous, cavernous and supraclinoid segments are widely patent. The left middle cerebral artery and the left anterior cerebral artery opacify into the capillary and venous phases. There is cross-filling via the anterior communicating artery of the right anterior cerebral artery. 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 Measurements were also performed of the left anterior cerebral artery A1 segment distally, and the right anterior cerebral artery A2 segment proximally. The angiographic findings were reviewed with the patient and the patient's husband. Again briefly reviewed were options of no treatment and conservative management versus endovascular treatment versus consideration for surgical clipping. The husband and the patient consented to endovascular treatment. They were informed that this may entail staged embolization given the wide neck of the aneurysm. The patient was then put under general anesthesia by the Department of Anesthesiology at [place] 1 catheter in the left common carotid artery was then exchanged over a 0.035 inch 300 cm Constance Holster exchange guidewire for a 6 French 80 cm Cook shuttle sheath using biplane roadmap technique constant fluoroscopic guidance. Good aspiration obtained from the hub of the [facility] shuttle sheath. A gentle control arteriogram performed through the [facility] shuttle sheath demonstrated no evidence of spasms, dissections or of intraluminal filling defects. Over a 0.035 inch Roadrunner guidewire, using biplane roadmap technique and constant fluoroscopic guidance, a Sofia 5 French 115 cm intermediary catheter was then advanced to the distal cervical segment of the left internal carotid artery followed by the [place] 5 [place] guide catheter. The guidewire was removed. Good aspiration was obtained from the hub of  the [facility] guide catheter. A control arteriogram performed through this demonstrated no change in the intracranial circulation. At this time, the [facility] shuttle sheath was advanced cranially to the mid cervical left ICA. Using biplane roadmap technique and constant fluoroscopic guidance, in a coaxial manner and with constant heparinized saline infusion, a Headway 17 2 tip microcatheter was advanced over a 0.014 inch Softip Synchro micro guidewire to the distal end of the [place] guide catheter. With the micro guidewire leading with a J-tip configuration, the combination was navigated to the supraclinoid left ICA. Using a torque device, the micro guidewire was then advanced into the left anterior cerebral A1 segment followed by the microcatheter. The micro guidewire was then advanced without difficulty across the anterior communicating artery into the right anterior cerebral A2 segment distally followed by the microcatheter. The guidewire was removed. Good aspiration obtained from the hub of the microcatheter. A gentle control arteriogram performed through the microcatheter demonstrated safe position of the tip of the microcatheter. This was then connected to continuous heparinized saline infusion. It was decided to place 4 mm x 24 mm Atlas Neurofrom stent across wide neck of the aneurysm as stage I of the embolization treatment. This was then advanced in a coaxial manner and with constant heparinized saline infusion to the distal end of the microcatheter. The O ring on the delivery microcatheter was then loosened. The tip of the Sofia catheter was now in the supraclinoid left ICA. Combination of the stent within the microcatheter was then slightly retrieved ensuring that there would be adequate coverage of the neck of the aneurysm. Once there, with slight forward gentle  traction with the right hand on the delivery micro guidewire, with the left hand the delivery microcatheter was retrieved unsheathing the distal, and  then the proximal portion of the stent. This was deployed without any difficulty. The delivery micro guidewire was then removed. Control arteriogram performed through the [facility] guide catheter in the supraclinoid left ICA demonstrated excellent apposition of the proximal and the distal portion of the stent with wide patency. There was already stasis noted within the dome of the aneurysm. Control arteriograms were then performed through the [facility] guide catheter in the left internal carotid artery at 10, 20 and 40 minutes post deployment of the stent. These continued to demonstrate excellent apposition without evidence of intraluminal filling defects within the stented segment and also distally. Patency of the anterior cerebral arteries bilaterally remained patent. No abnormal dissections or of intraluminal filling defects were seen. The 5 [place] intermediary catheter and the [facility] shuttle sheath were then retrieved and removed into the abdominal aorta and exchanged over a J-tip guidewire for a 6 [place] Pinnacle sheath. Throughout the procedure, the patient's blood pressure and neurological status remained stable. The patient's ACT was maintained in the region of approximately 200 seconds. No evidence of extravasation or dissections or spasm was seen. The 6 French Pinnacle sheath was then removed with manual pressure being held. At the time of removal of the sheath there already was a small hematoma just below the puncture site. This, however, continued to worsen during the process of manual compression. During this time the patient had also been extubated and was maintaining adequate blood gases. The patient was also responsive appropriately and moving her right leg and left arm. She continued to have difficulty with weakness of the left leg and right arm. Increased tone was noted in the left leg. The patient was, otherwise, appropriately responsive in terms of speech without any facial asymmetry. Her tongue was in the  midline. She did not exhibit any sensory changes. However, because of the new neurological changes, a code stroke was called. The neurology service evaluated the patient and it was decided to proceed with further workup with an MRI scan in order to ascertain the reason for the new neurological changes. However, because of the groin hematoma in the right groin with the patient's blood pressure falling associated with tachycardia, resuscitation was performed with IV fluids albumin and vasopressors. Instead vascular surgery consult was also called. The patient was evaluated by vascular surgery and it was decided that because of the low blood pressure despite vasopressor support and the expanding hematoma in the right groin, surgical aspiration was priority prior to consideration of further workup with an MRI of the brain. After patient had been resuscitated to blood pressure of 110-120 with vasopressors IV fluids and albumin, the patient was taken to the OR for right groin surgical exploration. 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 PLAN: Follow-up of in the clinic approximately 2-4 weeks post discharge. Electronically Signed   By: Luanne Bras M.D.   On: 04/02/2018 16:34   Dg Chest Port 1 View  Result Date: 04/05/2018 CLINICAL DATA:  Respiratory failure. EXAM: PORTABLE CHEST 1 VIEW COMPARISON:  04/04/2018. FINDINGS: Endotracheal tube, NG tube, right PICC line stable position. Heart size normal. Mild bilateral multifocal infiltrates/edema are noted. Heart size normal. No pleural effusion or pneumothorax. IMPRESSION: 1.  Lines and tubes in stable  position. 2. Mild bilateral multifocal infiltrates/edema noted on today's exam. Aspiration can not be excluded. Electronically Signed   By: Marcello Moores  Register   On: 04/05/2018 06:44   Dg Chest Port 1 View  Result  Date: 04/04/2018 CLINICAL DATA:  Respiratory failure EXAM: PORTABLE CHEST 1 VIEW COMPARISON:  Two days ago FINDINGS: Endotracheal tube tip at the clavicular heads. The orogastric tube reaches the stomach. Mild atelectatic type opacity. There is no edema, consolidation, effusion, or pneumothorax. Normal heart size. IMPRESSION: Stable hardware positioning and atelectasis. Electronically Signed   By: Monte Fantasia M.D.   On: 04/04/2018 08:13   Dg Chest Port 1 View  Result Date: 04/02/2018 CLINICAL DATA:  Endotracheal tube. Dizziness. Weakness. Liver cancer. EXAM: PORTABLE CHEST 1 VIEW COMPARISON:  03/07/2018 FINDINGS: Stable endotracheal tube. NG tube placed. Tip is beyond the gastroesophageal junction. Normal heart size. Clear lungs. No pneumothorax. IMPRESSION: NG tube placed beyond the GE junction. Lungs remain clear. Electronically Signed   By: Marybelle Killings M.D.   On: 04/02/2018 07:58   Dg Chest Port 1 View  Result Date: 04/03/2018 CLINICAL DATA:  Status post intubation. EXAM: PORTABLE CHEST 1 VIEW COMPARISON:  None. FINDINGS: ET tube is in place with the tip approximately 1.7 cm above the carina. Lungs are clear. No pneumothorax or pleural effusion. Heart size is normal. No acute or focal bony abnormality. IMPRESSION: ETT tip is 1.7 cm above the carina. Lungs are clear. Electronically Signed   By: Inge Rise M.D.   On: 03/31/2018 16:08   Dg Abd Portable 1v  Result Date: 03/15/2018 CLINICAL DATA:  Feeding tube placement. EXAM: PORTABLE ABDOMEN - 1 VIEW COMPARISON:  None. FINDINGS: Enteric tube tip in the gastric antrum, likely near the pylorus. Nonobstructive bowel gas pattern. Visualized lungs are clear. IMPRESSION: Enteric tube tip in the distal stomach, likely near the pylorus. Electronically Signed   By: Titus Dubin M.D.   On: 03/16/2018 22:25   [place] Ekg Site Rite  Result Date: 04/03/2018 If Site Rite image not attached, placement could not be confirmed due to current cardiac  rhythm.  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 EXAM: TRANSCATHETER THERAPY EMBOLIZATION; IR ANGIO INTRA EXTRACRAN SEL INTERNAL CAROTID UNI LEFT MOD SED; RADIOLOGY EXAMINATION; IR ANGIO INTRA EXTRACRAN SEL COM CAROTID INNOMINATE UNI RIGHT MOD SED; IR ANGIO VERTEBRAL SEL VERTEBRAL BILAT MOD SED COMPARISON:  MRI MRA of the brain of 03/12/2018. MEDICATIONS: Heparin 4,500 units IV; Ancef 2 g IV. The antibiotic was administered within 1 hour of the procedure. ANESTHESIA/SEDATION: Mac anesthesia for the diagnostic portion, and general anesthesia for the treatment portion of the procedure provided by the Department of Anesthesiology at [place]:  Isovue 300 approximately 140 mL. FLUOROSCOPY TIME:  Fluoroscopy Time: 42 minutes 12 seconds (2076 mGy). COMPLICATIONS: None immediate. TECHNIQUE: Informed written consent was obtained from the patient after a thorough discussion of the procedural risks, benefits and alternatives. All questions were addressed. Maximal Sterile Barrier Technique was utilized including caps, mask, sterile gowns, sterile gloves, sterile drape, hand hygiene and skin antiseptic. A timeout was performed prior to the initiation of the procedure. The right groin was prepped and draped in the usual sterile fashion. Thereafter using modified Seldinger technique, transfemoral access into the right common femoral artery was obtained without difficulty. Over a 0.035 inch  guidewire, a 5 French Pinnacle sheath was inserted. Through this, and also over 0.035 inch guidewire, a 5 [place] JB 1 catheter was advanced to the aortic arch region and selectively positioned in the right common carotid artery, the right vertebral artery, the left common carotid artery and the left vertebral  artery. FINDINGS: The right common carotid arteriogram demonstrates the right external carotid artery and its major branches to be widely patent. The right internal carotid artery at the bulb to the cranial skull base also demonstrates wide patency. However, there are focal segmental area of small outpouchings without intraluminal narrowing in the distal 1/3 of the right internal carotid artery most compatible with mild fibromuscular dysplasia. Distal to this the cervical petrous junction is widely patent. The petrous, cavernous and supraclinoid segments demonstrate wide patency. The right middle cerebral artery is noted to opacify into the capillary and venous phases. 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 The origin the right vertebral artery is widely patent. The vessel is seen to opacify to the cranial skull base. At the level of the cranial skull base there is a focal area of mild stenosis. Distal to this the vessel assumes normal caliber. The opacified portions of the basilar artery, the superior cerebellar arteries and the anterior-inferior cerebellar arteries is normal into the capillary and venous phases. There is flash filling of the posterior cerebral arteries with unopacified blood in the distal half of the basilar artery from the contralateral vertebral artery. The left vertebral artery origin is widely patent. The vessel opacifies normally to the cranial skull base. There is mild tortuosity in its proximal half. The left vertebrobasilar junction and the left posterior-inferior cerebellar artery demonstrate normal opacification. The basilar artery, the posterior cerebral arteries, the superior cerebellar arteries and the anterior-inferior cerebellar arteries opacify into the capillary and venous phases. A left common carotid arteriogram demonstrates the left external carotid artery and its major branches to be widely patent. The left internal carotid  artery at the bulb to the cranial skull base demonstrates wide patency. The petrous, cavernous and supraclinoid segments are widely patent. The left middle cerebral artery and the left anterior cerebral artery opacify into the capillary and venous phases. There is cross-filling via the anterior communicating artery of the right anterior cerebral artery. 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 Measurements were also performed of the left anterior cerebral artery A1 segment distally, and the right anterior cerebral artery A2 segment proximally. The angiographic findings were reviewed with the patient and the patient's husband. Again briefly reviewed were options of no treatment and conservative management versus endovascular treatment versus consideration for surgical clipping. The husband and the patient consented to endovascular treatment. They were informed that this may entail staged embolization given the wide neck of the aneurysm. The patient was then put under general anesthesia by the Department of Anesthesiology at [place] 1 catheter in the left common carotid artery was then exchanged over a 0.035 inch 300 cm Constance Holster exchange guidewire for a 6 French 80 cm Cook shuttle sheath using biplane roadmap technique constant fluoroscopic guidance. Good aspiration obtained from the hub of the [facility] shuttle sheath. A gentle control arteriogram performed through the [facility] shuttle sheath demonstrated no evidence of spasms, dissections or of intraluminal filling defects. Over a 0.035 inch Roadrunner guidewire, using biplane roadmap technique and  constant fluoroscopic guidance, a Sofia 5 French 115 cm intermediary catheter was then advanced to the distal cervical segment of the left internal carotid artery followed by the [place] 5 [place] guide catheter. The guidewire  was removed. Good aspiration was obtained from the hub of the [place] guide catheter. A control arteriogram performed through this demonstrated no change in the intracranial circulation. At this time, the [facility] shuttle sheath was advanced cranially to the mid cervical left ICA. Using biplane roadmap technique and constant fluoroscopic guidance, in a coaxial manner and with constant heparinized saline infusion, a Headway 17 2 tip microcatheter was advanced over a 0.014 inch Softip Synchro micro guidewire to the distal end of the [place] guide catheter. With the micro guidewire leading with a J-tip configuration, the combination was navigated to the supraclinoid left ICA. Using a torque device, the micro guidewire was then advanced into the left anterior cerebral A1 segment followed by the microcatheter. The micro guidewire was then advanced without difficulty across the anterior communicating artery into the right anterior cerebral A2 segment distally followed by the microcatheter. The guidewire was removed. Good aspiration obtained from the hub of the microcatheter. A gentle control arteriogram performed through the microcatheter demonstrated safe position of the tip of the microcatheter. This was then connected to continuous heparinized saline infusion. It was decided to place 4 mm x 24 mm Atlas Neurofrom stent across wide neck of the aneurysm as stage I of the embolization treatment. This was then advanced in a coaxial manner and with constant heparinized saline infusion to the distal end of the microcatheter. The O ring on the delivery microcatheter was then loosened. The tip of the Sofia catheter was now in the supraclinoid left ICA. Combination of the stent within the microcatheter was then slightly retrieved ensuring that there would be adequate coverage of the neck of the aneurysm. Once there, with slight forward gentle traction with the right hand on the delivery micro guidewire, with the left hand the delivery  microcatheter was retrieved unsheathing the distal, and then the proximal portion of the stent. This was deployed without any difficulty. The delivery micro guidewire was then removed. Control arteriogram performed through the [facility] guide catheter in the supraclinoid left ICA demonstrated excellent apposition of the proximal and the distal portion of the stent with wide patency. There was already stasis noted within the dome of the aneurysm. Control arteriograms were then performed through the [facility] guide catheter in the left internal carotid artery at 10, 20 and 40 minutes post deployment of the stent. These continued to demonstrate excellent apposition without evidence of intraluminal filling defects within the stented segment and also distally. Patency of the anterior cerebral arteries bilaterally remained patent. No abnormal dissections or of intraluminal filling defects were seen. The 5 [place] intermediary catheter and the [facility] shuttle sheath were then retrieved and removed into the abdominal aorta and exchanged over a J-tip guidewire for a 6 [place] Pinnacle sheath. Throughout the procedure, the patient's blood pressure and neurological status remained stable. The patient's ACT was maintained in the region of approximately 200 seconds. No evidence of extravasation or dissections or spasm was seen. The 6 French Pinnacle sheath was then removed with manual pressure being held. At the time of removal of the sheath there already was a small hematoma just below the puncture site. This, however, continued to worsen during the process of manual compression. During this time the patient had also been extubated and was maintaining adequate blood gases. The patient was  also responsive appropriately and moving her right leg and left arm. She continued to have difficulty with weakness of the left leg and right arm. Increased tone was noted in the left leg. The patient was, otherwise, appropriately responsive in terms of  speech without any facial asymmetry. Her tongue was in the midline. She did not exhibit any sensory changes. However, because of the new neurological changes, a code stroke was called. The neurology service evaluated the patient and it was decided to proceed with further workup with an MRI scan in order to ascertain the reason for the new neurological changes. However, because of the groin hematoma in the right groin with the patient's blood pressure falling associated with tachycardia, resuscitation was performed with IV fluids albumin and vasopressors. Instead vascular surgery consult was also called. The patient was evaluated by vascular surgery and it was decided that because of the low blood pressure despite vasopressor support and the expanding hematoma in the right groin, surgical aspiration was priority prior to consideration of further workup with an MRI of the brain. After patient had been resuscitated to blood pressure of 110-120 with vasopressors IV fluids and albumin, the patient was taken to the OR for right groin surgical exploration. 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 PLAN: Follow-up of in the clinic approximately 2-4 weeks post discharge. Electronically Signed   By: Luanne Bras M.D.   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 EXAM: TRANSCATHETER THERAPY EMBOLIZATION; IR ANGIO INTRA EXTRACRAN SEL INTERNAL CAROTID UNI LEFT MOD SED; RADIOLOGY EXAMINATION; IR ANGIO INTRA EXTRACRAN SEL COM CAROTID INNOMINATE UNI RIGHT MOD SED; IR ANGIO VERTEBRAL SEL VERTEBRAL BILAT MOD SED COMPARISON:  MRI MRA of the brain of  03/12/2018. MEDICATIONS: Heparin 4,500 units IV; Ancef 2 g IV. The antibiotic was administered within 1 hour of the procedure. ANESTHESIA/SEDATION: Mac anesthesia for the diagnostic portion, and general anesthesia for the treatment portion of the procedure provided by the Department of Anesthesiology at [place]:  Isovue 300 approximately 140 mL. FLUOROSCOPY TIME:  Fluoroscopy Time: 42 minutes 12 seconds (2076 mGy). COMPLICATIONS: None immediate. TECHNIQUE: Informed written consent was obtained from the patient after a thorough discussion of the procedural risks, benefits and alternatives. All questions were addressed. Maximal Sterile Barrier Technique was utilized including caps, mask, sterile gowns, sterile gloves, sterile drape, hand hygiene and skin antiseptic. A timeout was performed prior to the initiation of the procedure. The right groin was prepped and draped in the usual sterile fashion. Thereafter using modified Seldinger technique, transfemoral access into the right common femoral artery was obtained without difficulty. Over a 0.035 inch guidewire, a 5 French Pinnacle sheath was inserted. Through this, and also over 0.035 inch guidewire, a 5 [place] JB 1 catheter was advanced to the aortic arch region and selectively positioned in the right common carotid artery, the right vertebral artery, the left common carotid artery and the left vertebral artery. FINDINGS: The right common carotid arteriogram demonstrates the right external carotid artery and its major branches to be widely patent. The right internal carotid artery at the bulb to the cranial skull base also demonstrates wide patency. However, there are  focal segmental area of small outpouchings without intraluminal narrowing in the distal 1/3 of the right internal carotid artery most compatible with mild fibromuscular dysplasia. Distal to this the cervical petrous junction is widely patent. The petrous, cavernous and supraclinoid  segments demonstrate wide patency. The right middle cerebral artery is noted to opacify into the capillary and venous phases. 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 The origin the right vertebral artery is widely patent. The vessel is seen to opacify to the cranial skull base. At the level of the cranial skull base there is a focal area of mild stenosis. Distal to this the vessel assumes normal caliber. The opacified portions of the basilar artery, the superior cerebellar arteries and the anterior-inferior cerebellar arteries is normal into the capillary and venous phases. There is flash filling of the posterior cerebral arteries with unopacified blood in the distal half of the basilar artery from the contralateral vertebral artery. The left vertebral artery origin is widely patent. The vessel opacifies normally to the cranial skull base. There is mild tortuosity in its proximal half. The left vertebrobasilar junction and the left posterior-inferior cerebellar artery demonstrate normal opacification. The basilar artery, the posterior cerebral arteries, the superior cerebellar arteries and the anterior-inferior cerebellar arteries opacify into the capillary and venous phases. A left common carotid arteriogram demonstrates the left external carotid artery and its major branches to be widely patent. The left internal carotid artery at the bulb to the cranial skull base demonstrates wide patency. The petrous, cavernous and supraclinoid segments are widely patent. The left middle cerebral artery and the left anterior cerebral artery opacify into the capillary and venous phases. There is cross-filling via the anterior communicating artery of the right anterior cerebral artery. 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 Measurements were also  performed of the left anterior cerebral artery A1 segment distally, and the right anterior cerebral artery A2 segment proximally. The angiographic findings were reviewed with the patient and the patient's husband. Again briefly reviewed were options of no treatment and conservative management versus endovascular treatment versus consideration for surgical clipping. The husband and the patient consented to endovascular treatment. They were informed that this may entail staged embolization given the wide neck of the aneurysm. The patient was then put under general anesthesia by the Department of Anesthesiology at [place] 1 catheter in the left common carotid artery was then exchanged over a 0.035 inch 300 cm Constance Holster exchange guidewire for a 6 French 80 cm Cook shuttle sheath using biplane roadmap technique constant fluoroscopic guidance. Good aspiration obtained from the hub of the [facility] shuttle sheath. A gentle control arteriogram performed through the [facility] shuttle sheath demonstrated no evidence of spasms, dissections or of intraluminal filling defects. Over a 0.035 inch Roadrunner guidewire, using biplane roadmap technique and constant fluoroscopic guidance, a Sofia 5 French 115 cm intermediary catheter was then advanced to the distal cervical segment of the left internal carotid artery followed by the [place] 5 [place] guide catheter. The guidewire was removed. Good aspiration was obtained from the hub of the [place] guide catheter. A control arteriogram performed through this demonstrated no change in the intracranial circulation. At this time, the [facility] shuttle sheath was advanced cranially to the mid cervical left ICA. Using biplane roadmap technique and constant fluoroscopic guidance, in a coaxial manner and with  constant heparinized saline infusion, a Headway 17 2 tip microcatheter was advanced over a 0.014 inch Softip Synchro micro guidewire to the distal end of the [place] guide  catheter. With the micro guidewire leading with a J-tip configuration, the combination was navigated to the supraclinoid left ICA. Using a torque device, the micro guidewire was then advanced into the left anterior cerebral A1 segment followed by the microcatheter. The micro guidewire was then advanced without difficulty across the anterior communicating artery into the right anterior cerebral A2 segment distally followed by the microcatheter. The guidewire was removed. Good aspiration obtained from the hub of the microcatheter. A gentle control arteriogram performed through the microcatheter demonstrated safe position of the tip of the microcatheter. This was then connected to continuous heparinized saline infusion. It was decided to place 4 mm x 24 mm Atlas Neurofrom stent across wide neck of the aneurysm as stage I of the embolization treatment. This was then advanced in a coaxial manner and with constant heparinized saline infusion to the distal end of the microcatheter. The O ring on the delivery microcatheter was then loosened. The tip of the Sofia catheter was now in the supraclinoid left ICA. Combination of the stent within the microcatheter was then slightly retrieved ensuring that there would be adequate coverage of the neck of the aneurysm. Once there, with slight forward gentle traction with the right hand on the delivery micro guidewire, with the left hand the delivery microcatheter was retrieved unsheathing the distal, and then the proximal portion of the stent. This was deployed without any difficulty. The delivery micro guidewire was then removed. Control arteriogram performed through the [facility] guide catheter in the supraclinoid left ICA demonstrated excellent apposition of the proximal and the distal portion of the stent with wide patency. There was already stasis noted within the dome of the aneurysm. Control arteriograms were then performed through the [facility] guide catheter in the left internal  carotid artery at 10, 20 and 40 minutes post deployment of the stent. These continued to demonstrate excellent apposition without evidence of intraluminal filling defects within the stented segment and also distally. Patency of the anterior cerebral arteries bilaterally remained patent. No abnormal dissections or of intraluminal filling defects were seen. The 5 [place] intermediary catheter and the [facility] shuttle sheath were then retrieved and removed into the abdominal aorta and exchanged over a J-tip guidewire for a 6 [place] Pinnacle sheath. Throughout the procedure, the patient's blood pressure and neurological status remained stable. The patient's ACT was maintained in the region of approximately 200 seconds. No evidence of extravasation or dissections or spasm was seen. The 6 French Pinnacle sheath was then removed with manual pressure being held. At the time of removal of the sheath there already was a small hematoma just below the puncture site. This, however, continued to worsen during the process of manual compression. During this time the patient had also been extubated and was maintaining adequate blood gases. The patient was also responsive appropriately and moving her right leg and left arm. She continued to have difficulty with weakness of the left leg and right arm. Increased tone was noted in the left leg. The patient was, otherwise, appropriately responsive in terms of speech without any facial asymmetry. Her tongue was in the midline. She did not exhibit any sensory changes. However, because of the new neurological changes, a code stroke was called. The neurology service evaluated the patient and it was decided to proceed with further workup with an MRI scan in  order to ascertain the reason for the new neurological changes. However, because of the groin hematoma in the right groin with the patient's blood pressure falling associated with tachycardia, resuscitation was performed with IV fluids albumin  and vasopressors. Instead vascular surgery consult was also called. The patient was evaluated by vascular surgery and it was decided that because of the low blood pressure despite vasopressor support and the expanding hematoma in the right groin, surgical aspiration was priority prior to consideration of further workup with an MRI of the brain. After patient had been resuscitated to blood pressure of 110-120 with vasopressors IV fluids and albumin, the patient was taken to the OR for right groin surgical exploration. 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 PLAN: Follow-up of in the clinic approximately 2-4 weeks post discharge. Electronically Signed   By: Luanne Bras M.D.   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 EXAM: TRANSCATHETER THERAPY EMBOLIZATION; IR ANGIO INTRA EXTRACRAN SEL INTERNAL CAROTID UNI LEFT MOD SED; RADIOLOGY EXAMINATION; IR ANGIO INTRA EXTRACRAN SEL COM CAROTID INNOMINATE UNI RIGHT MOD SED; IR ANGIO VERTEBRAL SEL VERTEBRAL BILAT MOD SED COMPARISON:  MRI MRA of the brain of 03/12/2018. MEDICATIONS: Heparin 4,500 units IV; Ancef 2 g IV. The antibiotic was administered within 1 hour of the procedure. ANESTHESIA/SEDATION: Mac anesthesia for the diagnostic portion, and general anesthesia for the treatment portion of the procedure provided by the Department of Anesthesiology at [place]:  Isovue 300 approximately 140 mL. FLUOROSCOPY TIME:  Fluoroscopy Time: 42 minutes 12 seconds (2076 mGy). COMPLICATIONS: None immediate. TECHNIQUE: Informed written consent was obtained from the patient after a thorough discussion  of the procedural risks, benefits and alternatives. All questions were addressed. Maximal Sterile Barrier Technique was utilized including caps, mask, sterile gowns, sterile gloves, sterile drape, hand hygiene and skin antiseptic. A timeout was performed prior to the initiation of the procedure. The right groin was prepped and draped in the usual sterile fashion. Thereafter using modified Seldinger technique, transfemoral access into the right common femoral artery was obtained without difficulty. Over a 0.035 inch guidewire, a 5 French Pinnacle sheath was inserted. Through this, and also over 0.035 inch guidewire, a 5 [place] JB 1 catheter was advanced to the aortic arch region and selectively positioned in the right common carotid artery, the right vertebral artery, the left common carotid artery and the left vertebral artery. FINDINGS: The right common carotid arteriogram demonstrates the right external carotid artery and its major branches to be widely patent. The right internal carotid artery at the bulb to the cranial skull base also demonstrates wide patency. However, there are focal segmental area of small outpouchings without intraluminal narrowing in the distal 1/3 of the right internal carotid artery most compatible with mild fibromuscular dysplasia. Distal to this the cervical petrous junction is widely patent. The petrous, cavernous and supraclinoid segments demonstrate wide patency. The right middle cerebral artery is noted to opacify into the capillary and venous phases. 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 The origin the right vertebral artery is widely patent. The vessel is seen to opacify to the cranial skull base. At the  level of the cranial skull base there is a focal area of mild stenosis. Distal to this the vessel assumes normal caliber. The opacified portions of the basilar artery, the superior cerebellar arteries and the anterior-inferior  cerebellar arteries is normal into the capillary and venous phases. There is flash filling of the posterior cerebral arteries with unopacified blood in the distal half of the basilar artery from the contralateral vertebral artery. The left vertebral artery origin is widely patent. The vessel opacifies normally to the cranial skull base. There is mild tortuosity in its proximal half. The left vertebrobasilar junction and the left posterior-inferior cerebellar artery demonstrate normal opacification. The basilar artery, the posterior cerebral arteries, the superior cerebellar arteries and the anterior-inferior cerebellar arteries opacify into the capillary and venous phases. A left common carotid arteriogram demonstrates the left external carotid artery and its major branches to be widely patent. The left internal carotid artery at the bulb to the cranial skull base demonstrates wide patency. The petrous, cavernous and supraclinoid segments are widely patent. The left middle cerebral artery and the left anterior cerebral artery opacify into the capillary and venous phases. There is cross-filling via the anterior communicating artery of the right anterior cerebral artery. 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 Measurements were also performed of the left anterior cerebral artery A1 segment distally, and the right anterior cerebral artery A2 segment proximally. The angiographic findings were reviewed with the patient and the patient's husband. Again briefly reviewed were options of no treatment and conservative management versus endovascular treatment versus consideration for surgical clipping. The husband and the patient consented to endovascular treatment. They were informed that this may entail staged embolization given the wide neck of the aneurysm. The patient was then put under  general anesthesia by the Department of Anesthesiology at [place] 1 catheter in the left common carotid artery was then exchanged over a 0.035 inch 300 cm Constance Holster exchange guidewire for a 6 French 80 cm Cook shuttle sheath using biplane roadmap technique constant fluoroscopic guidance. Good aspiration obtained from the hub of the [facility] shuttle sheath. A gentle control arteriogram performed through the [facility] shuttle sheath demonstrated no evidence of spasms, dissections or of intraluminal filling defects. Over a 0.035 inch Roadrunner guidewire, using biplane roadmap technique and constant fluoroscopic guidance, a Sofia 5 French 115 cm intermediary catheter was then advanced to the distal cervical segment of the left internal carotid artery followed by the [place] 5 [place] guide catheter. The guidewire was removed. Good aspiration was obtained from the hub of the [place] guide catheter. A control arteriogram performed through this demonstrated no change in the intracranial circulation. At this time, the [facility] shuttle sheath was advanced cranially to the mid cervical left ICA. Using biplane roadmap technique and constant fluoroscopic guidance, in a coaxial manner and with constant heparinized saline infusion, a Headway 17 2 tip microcatheter was advanced over a 0.014 inch Softip Synchro micro guidewire to the distal end of the [place] guide catheter. With the micro guidewire leading with a J-tip configuration, the combination was navigated to the supraclinoid left ICA. Using a torque device, the micro guidewire was then advanced into the left anterior cerebral A1 segment followed by the microcatheter. The micro guidewire was then advanced without difficulty across the anterior communicating artery into the right anterior cerebral A2 segment distally followed by the microcatheter. The guidewire  was removed. Good aspiration obtained from the hub of the microcatheter. A gentle control arteriogram  performed through the microcatheter demonstrated safe position of the tip of the microcatheter. This was then connected to continuous heparinized saline infusion. It was decided to place 4 mm x 24 mm Atlas Neurofrom stent across wide neck of the aneurysm as stage I of the embolization treatment. This was then advanced in a coaxial manner and with constant heparinized saline infusion to the distal end of the microcatheter. The O ring on the delivery microcatheter was then loosened. The tip of the Sofia catheter was now in the supraclinoid left ICA. Combination of the stent within the microcatheter was then slightly retrieved ensuring that there would be adequate coverage of the neck of the aneurysm. Once there, with slight forward gentle traction with the right hand on the delivery micro guidewire, with the left hand the delivery microcatheter was retrieved unsheathing the distal, and then the proximal portion of the stent. This was deployed without any difficulty. The delivery micro guidewire was then removed. Control arteriogram performed through the [facility] guide catheter in the supraclinoid left ICA demonstrated excellent apposition of the proximal and the distal portion of the stent with wide patency. There was already stasis noted within the dome of the aneurysm. Control arteriograms were then performed through the [facility] guide catheter in the left internal carotid artery at 10, 20 and 40 minutes post deployment of the stent. These continued to demonstrate excellent apposition without evidence of intraluminal filling defects within the stented segment and also distally. Patency of the anterior cerebral arteries bilaterally remained patent. No abnormal dissections or of intraluminal filling defects were seen. The 5 [place] intermediary catheter and the [facility] shuttle sheath were then retrieved and removed into the abdominal aorta and exchanged over a J-tip guidewire for a 6 [place] Pinnacle sheath. Throughout the  procedure, the patient's blood pressure and neurological status remained stable. The patient's ACT was maintained in the region of approximately 200 seconds. No evidence of extravasation or dissections or spasm was seen. The 6 French Pinnacle sheath was then removed with manual pressure being held. At the time of removal of the sheath there already was a small hematoma just below the puncture site. This, however, continued to worsen during the process of manual compression. During this time the patient had also been extubated and was maintaining adequate blood gases. The patient was also responsive appropriately and moving her right leg and left arm. She continued to have difficulty with weakness of the left leg and right arm. Increased tone was noted in the left leg. The patient was, otherwise, appropriately responsive in terms of speech without any facial asymmetry. Her tongue was in the midline. She did not exhibit any sensory changes. However, because of the new neurological changes, a code stroke was called. The neurology service evaluated the patient and it was decided to proceed with further workup with an MRI scan in order to ascertain the reason for the new neurological changes. However, because of the groin hematoma in the right groin with the patient's blood pressure falling associated with tachycardia, resuscitation was performed with IV fluids albumin and vasopressors. Instead vascular surgery consult was also called. The patient was evaluated by vascular surgery and it was decided that because of the low blood pressure despite vasopressor support and the expanding hematoma in the right groin, surgical aspiration was priority prior to consideration of further workup with an MRI of the brain. After patient had been resuscitated  to blood pressure of 110-120 with vasopressors IV fluids and albumin, the patient was taken to the OR for right groin surgical exploration. 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 PLAN: Follow-up of in the clinic approximately 2-4 weeks post discharge. Electronically Signed   By: Luanne Bras M.D.   On: 04/02/2018 16:34   Ir Neuro Each Add'l After Basic Uni Left (ms)  Result Date: 04/03/2018 CLINICAL DATA:  Patient with headaches and discovery of a bilobed approximately 5.5 mm saccular aneurysm arising in the anterior communicating artery region. EXAM: TRANSCATHETER THERAPY EMBOLIZATION; IR ANGIO INTRA EXTRACRAN SEL INTERNAL CAROTID UNI LEFT MOD SED; RADIOLOGY EXAMINATION; IR ANGIO INTRA EXTRACRAN SEL COM CAROTID INNOMINATE UNI RIGHT MOD SED; IR ANGIO VERTEBRAL SEL VERTEBRAL BILAT MOD SED COMPARISON:  MRI MRA of the brain of 03/12/2018. MEDICATIONS: Heparin 4,500 units IV; Ancef 2 g IV. The antibiotic was administered within 1 hour of the procedure. ANESTHESIA/SEDATION: Mac anesthesia for the diagnostic portion, and general anesthesia for the treatment portion of the procedure provided by the Department of Anesthesiology at [place]:  Isovue 300 approximately 140 mL. FLUOROSCOPY TIME:  Fluoroscopy Time: 42 minutes 12 seconds (2076 mGy). COMPLICATIONS: None immediate. TECHNIQUE: Informed written consent was obtained from the patient after a thorough discussion of the procedural risks, benefits and alternatives. All questions were addressed. Maximal Sterile Barrier Technique was utilized including caps, mask, sterile gowns, sterile gloves, sterile drape, hand hygiene and skin antiseptic. A timeout was performed prior to the initiation of the procedure. The right groin was prepped and draped in the usual sterile fashion. Thereafter using modified Seldinger technique, transfemoral access into the right common femoral artery was obtained without difficulty. Over a 0.035 inch guidewire, a 5 French Pinnacle sheath  was inserted. Through this, and also over 0.035 inch guidewire, a 5 [place] JB 1 catheter was advanced to the aortic arch region and selectively positioned in the right common carotid artery, the right vertebral artery, the left common carotid artery and the left vertebral artery. FINDINGS: The right common carotid arteriogram demonstrates the right external carotid artery and its major branches to be widely patent. The right internal carotid artery at the bulb to the cranial skull base also demonstrates wide patency. However, there are focal segmental area of small outpouchings without intraluminal narrowing in the distal 1/3 of the right internal carotid artery most compatible with mild fibromuscular dysplasia. Distal to this the cervical petrous junction is widely patent. The petrous, cavernous and supraclinoid segments demonstrate wide patency. The right middle cerebral artery is noted to opacify into the capillary and venous phases. 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 The origin the right vertebral artery is widely patent. The vessel is seen to opacify to the cranial skull base. At the level of the cranial skull base there is a focal area of mild stenosis. Distal to this the vessel assumes normal caliber. The opacified portions of the basilar artery, the superior cerebellar arteries and the anterior-inferior cerebellar arteries is normal into the capillary and venous phases. There is flash filling of the posterior cerebral arteries with unopacified blood in the distal half of the basilar artery from the contralateral vertebral artery. The left vertebral artery origin is widely patent. The vessel opacifies normally to the cranial skull base. There is mild tortuosity in its  proximal half. The left vertebrobasilar junction and the left posterior-inferior cerebellar artery demonstrate normal opacification. The basilar artery, the posterior cerebral arteries, the  superior cerebellar arteries and the anterior-inferior cerebellar arteries opacify into the capillary and venous phases. A left common carotid arteriogram demonstrates the left external carotid artery and its major branches to be widely patent. The left internal carotid artery at the bulb to the cranial skull base demonstrates wide patency. The petrous, cavernous and supraclinoid segments are widely patent. The left middle cerebral artery and the left anterior cerebral artery opacify into the capillary and venous phases. There is cross-filling via the anterior communicating artery of the right anterior cerebral artery. 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 Measurements were also performed of the left anterior cerebral artery A1 segment distally, and the right anterior cerebral artery A2 segment proximally. The angiographic findings were reviewed with the patient and the patient's husband. Again briefly reviewed were options of no treatment and conservative management versus endovascular treatment versus consideration for surgical clipping. The husband and the patient consented to endovascular treatment. They were informed that this may entail staged embolization given the wide neck of the aneurysm. The patient was then put under general anesthesia by the Department of Anesthesiology at [facility] 1 catheter in the left common carotid artery was then exchanged over a 0.035 inch 300 cm Constance Holster exchange guidewire for a 6 French 80 cm Cook shuttle sheath using biplane roadmap technique constant fluoroscopic guidance. Good aspiration obtained from the hub of the [facility] shuttle sheath. A gentle control arteriogram performed through the [facility] shuttle sheath demonstrated no evidence of spasms, dissections or of intraluminal filling defects. Over a 0.035 inch Roadrunner  guidewire, using biplane roadmap technique and constant fluoroscopic guidance, a Sofia 5 French 115 cm intermediary catheter was then advanced to the distal cervical segment of the left internal carotid artery followed by the [place] 5 [place] guide catheter. The guidewire was removed. Good aspiration was obtained from the hub of the [place] guide catheter. A control arteriogram performed through this demonstrated no change in the intracranial circulation. At this time, the [facility] shuttle sheath was advanced cranially to the mid cervical left ICA. Using biplane roadmap technique and constant fluoroscopic guidance, in a coaxial manner and with constant heparinized saline infusion, a Headway 17 2 tip microcatheter was advanced over a 0.014 inch Softip Synchro micro guidewire to the distal end of the [place] guide catheter. With the micro guidewire leading with a J-tip configuration, the combination was navigated to the supraclinoid left ICA. Using a torque device, the micro guidewire was then advanced into the left anterior cerebral A1 segment followed by the microcatheter. The micro guidewire was then advanced without difficulty across the anterior communicating artery into the right anterior cerebral A2 segment distally followed by the microcatheter. The guidewire was removed. Good aspiration obtained from the hub of the microcatheter. A gentle control arteriogram performed through the microcatheter demonstrated safe position of the tip of the microcatheter. This was then connected to continuous heparinized saline infusion. It was decided to place 4 mm x 24 mm Atlas Neurofrom stent across wide neck of the aneurysm as stage I of the embolization treatment. This was then advanced in a coaxial manner and with constant heparinized saline infusion to the distal end of the microcatheter. The O ring on the delivery microcatheter was then loosened.  The tip of the Sofia catheter was now in the supraclinoid left ICA. Combination of  the stent within the microcatheter was then slightly retrieved ensuring that there would be adequate coverage of the neck of the aneurysm. Once there, with slight forward gentle traction with the right hand on the delivery micro guidewire, with the left hand the delivery microcatheter was retrieved unsheathing the distal, and then the proximal portion of the stent. This was deployed without any difficulty. The delivery micro guidewire was then removed. Control arteriogram performed through the [facility] guide catheter in the supraclinoid left ICA demonstrated excellent apposition of the proximal and the distal portion of the stent with wide patency. There was already stasis noted within the dome of the aneurysm. Control arteriograms were then performed through the [facility] guide catheter in the left internal carotid artery at 10, 20 and 40 minutes post deployment of the stent. These continued to demonstrate excellent apposition without evidence of intraluminal filling defects within the stented segment and also distally. Patency of the anterior cerebral arteries bilaterally remained patent. No abnormal dissections or of intraluminal filling defects were seen. The 5 [place] intermediary catheter and the [facility] shuttle sheath were then retrieved and removed into the abdominal aorta and exchanged over a J-tip guidewire for a 6 [place] Pinnacle sheath. Throughout the procedure, the patient's blood pressure and neurological status remained stable. The patient's ACT was maintained in the region of approximately 200 seconds. No evidence of extravasation or dissections or spasm was seen. The 6 French Pinnacle sheath was then removed with manual pressure being held. At the time of removal of the sheath there already was a small hematoma just below the puncture site. This, however, continued to worsen during the process of manual compression. During this time the patient had also been extubated and was maintaining adequate blood gases.  The patient was also responsive appropriately and moving her right leg and left arm. She continued to have difficulty with weakness of the left leg and right arm. Increased tone was noted in the left leg. The patient was, otherwise, appropriately responsive in terms of speech without any facial asymmetry. Her tongue was in the midline. She did not exhibit any sensory changes. However, because of the new neurological changes, a code stroke was called. The neurology service evaluated the patient and it was decided to proceed with further workup with an MRI scan in order to ascertain the reason for the new neurological changes. However, because of the groin hematoma in the right groin with the patient's blood pressure falling associated with tachycardia, resuscitation was performed with IV fluids albumin and vasopressors. Instead vascular surgery consult was also called. The patient was evaluated by vascular surgery and it was decided that because of the low blood pressure despite vasopressor support and the expanding hematoma in the right groin, surgical aspiration was priority prior to consideration of further workup with an MRI of the brain. After patient had been resuscitated to blood pressure of 110-120 with vasopressors IV fluids and albumin, the patient was taken to the OR for right groin surgical exploration. 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 PLAN: Follow-up of in the clinic approximately 2-4 weeks post discharge. Electronically Signed   By: Luanne Bras M.D.   On: 04/02/2018 16:34    Labs:  CBC: Recent Labs    04/02/18 0449 04/03/18 0730 04/04/18  7543 04/05/18 0553  WBC 15.6* 19.0* 7.5 6.5  HGB 11.6* 9.9* 8.4* 7.0*  HCT 34.7* 32.2* 26.5* 21.8*  PLT 152 115* 80* 89*    COAGS: Recent Labs    03/10/18 1058 03/31/2018 0653  03/29/2018 1608  INR 0.97 1.03 1.24  APTT 25  --  26    BMP: Recent Labs    04/02/18 0449 04/03/18 0724 04/04/18 1045 04/05/18 0553  NA 139 141 139 140  K 4.6 4.2 3.3* 3.1*  CL 115* 118* 117* 113*  CO2 18* 19* 17* 22  GLUCOSE 127* 138* 156* 145*  BUN 15 32* 28* 25*  CALCIUM 8.9 8.5* 8.6* 8.6*  CREATININE 1.05* 1.78* 0.97 0.85  GFRNONAA 60* 32* >60 >60  GFRAA >60 37* >60 >60    LIVER FUNCTION TESTS: Recent Labs    03/10/18 1058 03/11/18 2024  BILITOT 0.5 0.3  AST 18 16  ALT 17 14  ALKPHOS 101 80  PROT 6.9 5.7*  ALBUMIN 3.8 3.2*    Assessment and Plan:  ACOM aneurysm s/p embolization using an ATLAS neuroform stent 03/29/2018 by Dr. Estanislado Pandy. Patient's condition stable- remains intubated and sedation, opens eyes to voice but does not follow commands, demonstrates increased muscle tone in all extremities and neck but does have spontaneous movements of lower extremities (can flex knees, R>L) and is biting at her ET tube. Right groin incision stable- plans per vascular surgery. GOC/DNR discussed with husband yesterday who asks for more time with his decision. Continue taking Brilinta 90 mg twice daily and Aspirin 81 mg once daily. Appreciate and agree with neurology, CCM, and vascular surgery management. IR to follow.   Electronically Signed: Earley Abide, PA-C 04/05/2018, 8:45 AM   I spent a total of 25 Minutes at the the patient's bedside AND on the patient's hospital floor or unit, greater than 50% of which was counseling/coordinating care for [facility] aneurysm s/p embolization.

## 2018-04-05 NOTE — Progress Notes (Signed)
STROKE TEAM PROGRESS NOTE   SUBJECTIVE (INTERVAL HISTORY) No family is at the bedside. No significant neuro changes over night. Some improvement of UE muscle tone but not in LE or neck. Still intubated, BP improved off Neo-Synephrine. Temp 100.9, on precedex  OBJECTIVE Temp:  [98.6 F (37 C)-100.9 F (38.3 C)] 100.9 F (38.3 C) (01/31 0743) Pulse Rate:  [85-112] 89 (01/31 1116) Cardiac Rhythm: Normal sinus rhythm;Sinus tachycardia (01/31 0800) Resp:  [9-34] 22 (01/31 1116) BP: (98-153)/(64-113) 111/73 (01/31 1116) SpO2:  [95 %-100 %] 97 % (01/31 1131) FiO2 (%):  [30 %-40 %] 30 % (01/31 1131) Weight:  [113.8 kg] 113.8 kg (01/31 0500)  Recent Labs  Lab 04/04/18 1922 04/04/18 2300 04/05/18 0335 04/05/18 0853 04/05/18 1129  GLUCAP 164* 141* 121* 123* 145*   Recent Labs  Lab 03/18/2018 1608 04/02/18 0449  04/03/18 0724 04/03/18 1637 04/04/18 0432 04/04/18 1045 04/04/18 1748 04/05/18 0553  NA 136 139  --  141  --   --  139  --  140  K 4.4 4.6  --  4.2  --   --  3.3*  --  3.1*  CL 111 115*  --  118*  --   --  117*  --  113*  CO2 17* 18*  --  19*  --   --  17*  --  22  GLUCOSE 165* 127*  --  138*  --   --  156*  --  145*  BUN 13 15  --  32*  --   --  28*  --  25*  CREATININE 1.04* 1.05*  --  1.78*  --   --  0.97  --  0.85  CALCIUM 9.3 8.9  --  8.5*  --   --  8.6*  --  8.6*  MG  --  1.6*   < > 1.6* 1.5* 1.9  --  1.7 1.6*  PHOS  --  4.1   < > 3.2 1.9* 1.8*  --  1.6* 1.7*   < > = values in this interval not displayed.   No results for input(s): AST, ALT, ALKPHOS, BILITOT, PROT, ALBUMIN in the last 168 hours. Recent Labs  Lab 03/27/2018 0653  03/28/2018 2022 04/02/18 0449 04/03/18 0730 04/04/18 0432 04/05/18 0553 04/05/18 0954  WBC 7.3   < > 16.1* 15.6* 19.0* 7.5 6.5  --   NEUTROABS 4.9  --   --  13.1*  --   --   --   --   HGB 12.8   < > 12.5 11.6* 9.9* 8.4* 7.0* 6.8*  HCT 42.9   < > 38.4 34.7* 32.2* 26.5* 21.8* 20.5*  MCV 91.7   < > 86.7 87.0 93.3 89.5 87.9  --   PLT  212   < > 159 152 115* 80* 89*  --    < > = values in this interval not displayed.   Recent Labs  Lab 04/02/18 1236  CKTOTAL 103   No results for input(s): LABPROT, INR in the last 72 hours. No results for input(s): COLORURINE, LABSPEC, [place], GLUCOSEU, HGBUR, BILIRUBINUR, KETONESUR, PROTEINUR, UROBILINOGEN, NITRITE, LEUKOCYTESUR in the last 72 hours.  Invalid input(s): APPERANCEUR     Component Value Date/Time   CHOL 175 03/11/2018 0624   TRIG 155 (H) 03/11/2018 0624   HDL 48 03/11/2018 0624   CHOLHDL 3.6 03/11/2018 0624   VLDL 31 03/11/2018 0624   LDLCALC 96 03/11/2018 0624   Lab Results  Component Value Date  HGBA1C 4.7 (L) 03/11/2018      Component Value Date/Time   LABOPIA NONE DETECTED 03/10/2018 1210   COCAINSCRNUR NONE DETECTED 03/10/2018 1210   LABBENZ POSITIVE (A) 03/10/2018 1210   AMPHETMU NONE DETECTED 03/10/2018 1210   THCU NONE DETECTED 03/10/2018 1210   LABBARB NONE DETECTED 03/10/2018 1210    No results for input(s): ETH in the last 168 hours.  I have personally reviewed the radiological images below and agree with the radiology interpretations.  Ct Head Wo Contrast  Result Date: 04/02/2018 CLINICAL DATA:  Stroke, follow-up. EXAM: CT HEAD WITHOUT CONTRAST TECHNIQUE: Contiguous axial images were obtained from the base of the skull through the vertex without intravenous contrast. COMPARISON:  MRI brain 03/18/2018. FINDINGS: Brain: Bilateral watershed territory infarcts are confirmed as hypoattenuation involving the cortex, left greater than right. There is no associated hemorrhage. Left ACA stent is stable in position. There is no hemorrhage or new infarct. Ventricles are of normal size. Insert pass fluid Vascular: ACA stent is in place. No significant atherosclerotic calcifications are present. There is no hyperdense vessel. Skull: Calvarium is intact. No focal lytic or blastic lesions are present. Sinuses/Orbits: The paranasal sinuses and mastoid air cells  are clear. The globes and orbits are within normal limits. IMPRESSION: 1. Expected progression of bilateral watershed territory infarcts, left greater than right. 2. No new infarct territory or hemorrhage. 3. ACA stent. Electronically Signed   By: San Morelle M.D.   On: 04/02/2018 09:51   Ct Head Wo Contrast  Result Date: 03/10/2018 CLINICAL DATA:  Acute onset dizziness and difficulty walking today. EXAM: CT HEAD WITHOUT CONTRAST TECHNIQUE: Contiguous axial images were obtained from the base of the skull through the vertex without intravenous contrast. COMPARISON:  None. FINDINGS: Brain: No evidence of acute infarction, hemorrhage, hydrocephalus, extra-axial collection or mass lesion/mass effect. Vascular: No hyperdense vessel or unexpected calcification. Skull: Normal. Negative for fracture or focal lesion. Sinuses/Orbits: Negative. Other: None. IMPRESSION: Negative head CT. Electronically Signed   By: Inge Rise M.D.   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 Acute onset of right arm and leg weakness. EXAM: MRI HEAD WITHOUT CONTRAST MRA HEAD WITHOUT CONTRAST TECHNIQUE: Multiplanar, multiecho pulse sequences of the brain and surrounding structures were obtained without intravenous contrast. Angiographic images of the head were obtained using MRA technique without contrast. COMPARISON:  Angiographic images same day.  MRI 03/12/2018. FINDINGS: MRI HEAD FINDINGS Brain: The brain shows bilateral watershed distribution cortical and subcortical edema and restricted diffusion, affecting both hemispheres but more extensive on the left than the right. The question is if this represents true bilateral watershed infarction or if it represents a vaso regulatory disturbance related to spasm. No specific vascular territory infarction is seen. No sign of hemorrhage. Chronic  small-vessel ischemic changes affect the brainstem and the cerebral hemispheric white matter as seen previously. No hydrocephalus. No extra-axial collection. Vascular: Major vessels at the base of the brain show flow. 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 There does appear to be a spasm appearance of the A1 vessel on the left and both anterior cerebral arteries, more extensively the right than the left. 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 No middle cerebral artery abnormality is seen. Posterior circulation vessels continued to  show a normal appearance. IMPRESSION: Bilateral watershed distribution cortical and subcortical edema and restricted diffusion, more extensive on the left than the right. Differential diagnosis is true watershed infarction versus vaso regulatory disturbance secondary to arterial spasm, which is demonstrated in the anterior cerebral vessels by MR angiography. 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 Electronically Signed   By: Nelson Chimes M.D.   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 Acute onset of right arm and leg weakness. EXAM: MRI HEAD WITHOUT CONTRAST MRA HEAD WITHOUT CONTRAST TECHNIQUE: Multiplanar, multiecho pulse sequences of the brain and surrounding structures were obtained without intravenous contrast. Angiographic images of the head were obtained using MRA technique without contrast. COMPARISON:  Angiographic images same day.  MRI 03/12/2018. FINDINGS: MRI HEAD FINDINGS Brain: The brain shows bilateral watershed distribution cortical and subcortical edema and restricted diffusion, affecting both hemispheres but more extensive on the left  than the right. The question is if this represents true bilateral watershed infarction or if it represents a vaso regulatory disturbance related to spasm. No specific vascular territory infarction is seen. No sign of hemorrhage. Chronic small-vessel ischemic changes affect the brainstem and the cerebral hemispheric white matter as seen previously. No hydrocephalus. No extra-axial collection. Vascular: Major vessels at the base of the brain show flow. 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 There does appear to be a spasm appearance of the A1 vessel on the left and both anterior cerebral arteries, more extensively the right than the left. 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 No middle cerebral artery abnormality is seen. Posterior circulation vessels continued to show a normal appearance. IMPRESSION: Bilateral watershed distribution cortical and subcortical edema and restricted diffusion, more extensive on the left than the right. Differential diagnosis is true watershed infarction versus vaso regulatory disturbance secondary to arterial spasm, which is demonstrated in the anterior cerebral vessels by MR angiography. 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 Electronically Signed   By: Nelson Chimes M.D.   On: 03/14/2018 16:02   Mr Brain Wo Contrast  Result Date: 03/11/2018 CLINICAL DATA:  Headache, new, malignancy suspected. Dizziness and weakness for 5 days EXAM: MRI HEAD WITHOUT CONTRAST MRA HEAD WITHOUT CONTRAST TECHNIQUE: Multiplanar, multiecho pulse sequences of the brain and surrounding structures were obtained without intravenous contrast. Angiographic images of the head were obtained using MRA technique without contrast. COMPARISON:  None. FINDINGS: MRI HEAD FINDINGS Brain: No acute infarction,  hemorrhage, hydrocephalus, extra-axial collection or mass lesion. Mild patchy FLAIR hyperintensity in the pons. Even milder periventricular FLAIR hyperintensity. Normal brain volume. Partially empty sella considered incidental in isolation. Vascular: Arterial findings below. Normal dural venous sinus flow voids. 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 No major branch occlusion or flow limiting stenosis. Negative for vessel beading. IMPRESSION: Brain MRI: 1. No acute finding or specific explanation for headache. 2. Mild signal abnormality in the pons and periventricular white matter that is usually from chronic small vessel ischemia. Intracranial MRA: 1. 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 2. No emergent finding. Electronically Signed   By: Monte Fantasia M.D.   On: 03/11/2018 09:08   Mr Brain W Contrast  Result Date: 03/12/2018 CLINICAL DATA:  Liver cancer. Headache. Rule out metastatic disease. EXAM: MRI HEAD WITH CONTRAST TECHNIQUE: Multiplanar, multiecho pulse sequences of the brain and surrounding structures were obtained with intravenous contrast. CONTRAST:  10 mL Gadovist IV COMPARISON:  Unenhanced MRI head 03/11/2018 FINDINGS: Normal enhancement. No enhancing mass lesion. Leptomeningeal enhancement normal. No acute abnormality. See recent unenhanced MRI report. IMPRESSION: No acute abnormality and negative for intracranial metastatic disease. Electronically Signed   By: Franchot Gallo M.D.   On: 03/12/2018 11:16   US Carotid Bilateral (at [facility] And [facility] Only)  Result Date: 03/11/2018 CLINICAL DATA:  58 year old female with history of vertigo EXAM: BILATERAL CAROTID DUPLEX ULTRASOUND TECHNIQUE: Pearline Cables scale imaging, color Doppler and duplex  ultrasound were performed of bilateral carotid and vertebral arteries in the neck. COMPARISON:  None. FINDINGS: Criteria: Quantification of carotid stenosis is based on velocity parameters that correlate the residual internal carotid diameter with NASCET-based stenosis levels, using the diameter of the distal internal carotid lumen as the denominator for stenosis measurement. The following velocity measurements were obtained: RIGHT ICA:  Systolic 91 cm/sec, Diastolic 34 cm/sec CCA:  782 cm/sec SYSTOLIC ICA/CCA RATIO:  0.9 ECA:  96 cm/sec LEFT ICA:  Systolic 78 cm/sec, Diastolic 38 cm/sec CCA:  93 cm/sec SYSTOLIC ICA/CCA RATIO:  0.8 ECA:  77 cm/sec Right Brachial SBP: Not acquired Left Brachial SBP: Not acquired RIGHT CAROTID ARTERY: No significant calcified disease of the right common carotid artery. Intermediate waveform maintained. Heterogeneous plaque without significant calcifications at the right carotid bifurcation. Low resistance waveform of the right ICA. No significant tortuosity. RIGHT VERTEBRAL ARTERY: Antegrade flow with low resistance waveform. LEFT CAROTID ARTERY: No significant calcified disease of the left common carotid artery. Intermediate waveform maintained. Heterogeneous plaque at the left carotid bifurcation without significant calcifications. Low resistance waveform of the left ICA. LEFT VERTEBRAL ARTERY:  Antegrade flow with low resistance waveform. Additional: Right thyroid nodule measures 1.8 cm. IMPRESSION: Color duplex indicates minimal heterogeneous plaque, with no hemodynamically significant stenosis by duplex criteria in the extracranial cerebrovascular circulation. Incidental finding of right thyroid nodule measuring 1.8 cm. Dedicated thyroid ultrasound recommended if further characterisation warranted. Signed, Dulcy Fanny. Dellia Nims, RPVI [facility] Radiology Electronically Signed   By: Corrie Mckusick D.O.   On: 03/11/2018 09:41   Mr  Jodene Nam Head/brain NF Cm  Result Date: 03/11/2018 CLINICAL DATA:  Headache, new, malignancy suspected. Dizziness and weakness for 5 days EXAM: MRI HEAD WITHOUT CONTRAST MRA HEAD WITHOUT CONTRAST TECHNIQUE: Multiplanar, multiecho pulse sequences of the brain and surrounding structures were obtained without intravenous contrast. Angiographic images of the head were obtained using MRA technique without contrast. COMPARISON:  None. FINDINGS: MRI HEAD FINDINGS Brain: No acute infarction, hemorrhage, hydrocephalus, extra-axial collection or mass lesion. Mild patchy FLAIR hyperintensity in the pons. Even milder periventricular FLAIR hyperintensity. Normal brain volume. Partially empty sella considered incidental in isolation. Vascular: Arterial findings below. Normal dural venous sinus flow voids. 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 No major branch occlusion or flow limiting stenosis. Negative for vessel beading. IMPRESSION: Brain MRI: 1.  No acute finding or specific explanation for headache. 2. Mild signal abnormality in the pons and periventricular white matter that is usually from chronic small vessel ischemia. Intracranial MRA: 1. 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 2. No emergent finding. Electronically Signed   By: Monte Fantasia M.D.   On: 03/11/2018 09:08   Mr Brain Wo Contrast  Result Date: 04/03/2018 CLINICAL DATA:  Stroke follow-up EXAM: MRI HEAD WITHOUT CONTRAST TECHNIQUE: Multiplanar, multiecho pulse sequences of the brain and surrounding structures were obtained without intravenous contrast. COMPARISON:  Brain MRI 03/21/2018 and head CT 04/03/2018 FINDINGS: BRAIN: Compared to the prior MRI of 03/13/2018 there has been progression of bilateral  watershed distribution infarcts, left-greater-than-right. The amount of ischemia is worst in the left frontal lobe. There is no midline shift or other mass effect. Partially empty sella. There is cytotoxic edema greatest in the superior anterior frontal lobes. Multifocal white matter hyperintensity, most commonly due to chronic ischemic microangiopathy. The cerebral and cerebellar volume are age-appropriate. Susceptibility-sensitive sequences show no chronic microhemorrhage or superficial siderosis. VASCULAR: Major intracranial arterial and venous sinus flow voids are normal. SKULL AND UPPER CERVICAL SPINE: Calvarial bone marrow signal is normal. There is no skull base mass. Visualized upper cervical spine and soft tissues are normal. SINUSES/ORBITS: No fluid levels or advanced mucosal thickening. No mastoid or middle ear effusion. The orbits are normal. IMPRESSION: 1. Worsening bilateral watershed distribution infarcts compared to MRI of 03/25/2018. Compared to the CT from earlier today, the distribution appears unchanged, allowing for differences in modality. 2. No hemorrhage or mass effect. Electronically Signed   By: Ulyses Jarred M.D.   On: 04/03/2018 20:33    PHYSICAL EXAM  Temp:  [98.6 F (37 C)-100.9 F (38.3 C)] 100.9 F (38.3 C) (01/31 0743) Pulse Rate:  [85-112] 89 (01/31 1116) Resp:  [9-34] 22 (01/31 1116) BP: (98-153)/(64-113) 111/73 (01/31 1116) SpO2:  [95 %-100 %] 97 % (01/31 1131) FiO2 (%):  [30 %-40 %] 30 % (01/31 1131) Weight:  [113.8 kg] 113.8 kg (01/31 0500)  General - Well nourished, well developed, intubated on precedex.  Ophthalmologic - fundi not visualized due to noncooperation.  Cardiovascular - Regular rate and rhythm.  Neuro - intubated on precedex, eyes open with voice, but not following commands. Eeyes mid position, not blinking to visual threat bilaterally, but blinking spontaneously, doll's eyes present, not tracking, PERRL. Oral involuntary movement with  chewing. Corneal reflex present bilaterally, gag and cough present. Breathing over the vent.  Facial symmetry not able to test due to ET tube.  Tongue midline in mouth. On pain stimulation, slight withdraw in all extremities.  Increased muscle tone on bilateral upper extremity but improved, BLEs increased muscle tone with extension position, and increased neck tone, difficulty with neck flexion. DTR 1+ and positive bilateral babinski. Sensation, coordination and gait not tested.   ASSESSMENT/PLAN Ms. Rebecca Cox is a 58 y.o. female with history of CKD, CAD, OSA, liver tumor, cerebral aneurysm admitted for arm leg weakness found after aneurysm coiling. No tPA given due to right groin severe bleeding.    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  Ongoing aggressive stroke risk factor management  Therapy recommendations: Pending  Disposition: Pending, a long discussion with husband at bedside regarding Max and DNR.  Husband needs time to think about it.  CODE STATUS full code at this time  Vegetative state with increased muscle tone bilaterally  Patient developed diffuse increased muscle tone in all extremities and the neck  MRI showed bilateral ACA and MCA/ACA infarcts  MRI and repeat extensive bilateral ACA and MCA/ACA infarcts, progressed from prior MRI  EEG no seizure  Resume home Wellbutrin and  Effexor as well as Topamax  Resume home Klonopin 0.5 mg twice daily  Supportive care   Hypotension   BP 110s  Now off neo   BP goal > 110  Fever   Tmax 100.9  CXR - Mild bilateral multifocal infiltrates  UA pending  Blood culture pending  CCM on board  AKI   Creatinine 1.04-1.05-1.78-0.97-0.85  On IVF   oligouria resolved  CCM on board  Close monitoring  ACOM aneurysm  Status post ACOM stent placement  On aspirin and Brilinta  Heparin IV discontinued  Dr.Deveshwar following  Right groin hematoma s/p right deep femoral A repair  Status post cerebral angiogram  VVS on board  P drainage off - on wound vac  Close vascular check  Continue aspirin and Brilinta  Severe anemia due to blood loss  Hemoglobin down from 12.8->6.1, received 4 units PRBC  hemoglobin 11.6-9.9-8.4-7.0-6.8 - PRBC  Stool occult blood pending   Consider CT abdomen to rule out peritoneal hemorrhage if needed.  Hyperlipidemia  Home meds:  Zocor 20   LDL 96, goal < 70  Now on Zocor 40  Continue statin at discharge  Other Stroke Risk Factors  Obesity, Body mass index is 41.75 kg/m.   Obstructive sleep apnea, on CPAP at home  Other Active Problems  Leukocytosis 16.1-15.6-19.0-7.5-6.5  Hospital day # 4  This patient is critically ill due to bilateral ACA, MCA/ACA territory infarcts, severe anemia requiring blood transfusion, groin hematoma with femoral artery repair, hypotension, leukocytosis and at significant risk of neurological worsening, death form recurrent stroke, hemorrhagic conversion, seizure, hypovolemic shock, sepsis. This patient's care requires constant monitoring of vital signs, hemodynamics, respiratory and cardiac monitoring, review of multiple databases, neurological assessment, discussion with family, other specialists and medical decision making of high complexity. I spent 35 minutes of neurocritical care time in the care of this patient. I  discussed with Dr. Estanislado Pandy and Dr. Thera Flake, MD PhD Stroke Neurology 04/05/2018 12:08 PM    To contact Stroke Continuity provider, please refer to [email or web address] After hours, contact General Neurology

## 2018-04-05 NOTE — Progress Notes (Signed)
Paged [place], [place] regarding critically low hgb 6.8. Verbal order for 1 unit PRBCs. See assoc orders.

## 2018-04-05 NOTE — Progress Notes (Signed)
RT NOTE:  Called to bedside by RN to place a biteblock in pt's mouth. Pt had been biting on ETT.

## 2018-04-06 ENCOUNTER — Inpatient Hospital Stay (HOSPITAL_COMMUNITY): Payer: Medicare Other

## 2018-04-06 DIAGNOSIS — Z9911 Dependence on respirator [ventilator] status: Secondary | ICD-10-CM

## 2018-04-06 LAB — BPAM RBC
Blood Product Expiration Date: 202002152359
ISSUE DATE / TIME: 202001311332
Unit Type and Rh: 6200

## 2018-04-06 LAB — BLOOD CULTURE ID PANEL (REFLEXED)
Acinetobacter baumannii: NOT DETECTED
CANDIDA ALBICANS: NOT DETECTED
Candida glabrata: NOT DETECTED
Candida krusei: NOT DETECTED
Candida parapsilosis: NOT DETECTED
Candida tropicalis: NOT DETECTED
ENTEROBACTERIACEAE SPECIES: NOT DETECTED
Enterobacter cloacae complex: NOT DETECTED
Enterococcus species: NOT DETECTED
Escherichia coli: NOT DETECTED
Haemophilus influenzae: NOT DETECTED
Klebsiella oxytoca: NOT DETECTED
Klebsiella pneumoniae: NOT DETECTED
Listeria monocytogenes: NOT DETECTED
Neisseria meningitidis: NOT DETECTED
Proteus species: NOT DETECTED
Pseudomonas aeruginosa: NOT DETECTED
Serratia marcescens: NOT DETECTED
Staphylococcus aureus (BCID): NOT DETECTED
Staphylococcus species: NOT DETECTED
Streptococcus agalactiae: NOT DETECTED
Streptococcus pneumoniae: NOT DETECTED
Streptococcus pyogenes: NOT DETECTED
Streptococcus species: DETECTED — AB

## 2018-04-06 LAB — GLUCOSE, CAPILLARY
Glucose-Capillary: 115 mg/dL — ABNORMAL HIGH (ref 70–99)
Glucose-Capillary: 101 mg/dL — ABNORMAL HIGH (ref 70–99)
Glucose-Capillary: 125 mg/dL — ABNORMAL HIGH (ref 70–99)
Glucose-Capillary: 102 mg/dL — ABNORMAL HIGH (ref 70–99)
Glucose-Capillary: 103 mg/dL — ABNORMAL HIGH (ref 70–99)
Glucose-Capillary: 125 mg/dL — ABNORMAL HIGH (ref 70–99)

## 2018-04-06 LAB — BASIC METABOLIC PANEL
Anion gap: 8 (ref 5–15)
BUN: 24 mg/dL — ABNORMAL HIGH (ref 6–20)
CHLORIDE: 111 mmol/L (ref 98–111)
CO2: 19 mmol/L — ABNORMAL LOW (ref 22–32)
CREATININE: 0.85 mg/dL (ref 0.44–1.00)
Calcium: 8.7 mg/dL — ABNORMAL LOW (ref 8.9–10.3)
GFR calc Af Amer: >60 mL/min (ref >60)
GFR calc non Af Amer: >60 mL/min (ref >60)
Glucose, Bld: 127 mg/dL — ABNORMAL HIGH (ref 70–99)
Potassium: 3.5 mmol/L (ref 3.5–5.1)
Sodium: 138 mmol/L (ref 135–145)

## 2018-04-06 LAB — CBC
HCT: 24.1 % — ABNORMAL LOW (ref 36.0–46.0)
Hemoglobin: 8 g/dL — ABNORMAL LOW (ref 12.0–15.0)
MCH: 29.3 pg (ref 26.0–34.0)
MCHC: 33.2 g/dL (ref 30.0–36.0)
MCV: 88.3 fL (ref 80.0–100.0)
Platelets: 105 10*3/uL — ABNORMAL LOW (ref 150–400)
RBC: 2.73 MIL/uL — ABNORMAL LOW (ref 3.87–5.11)
RDW: 14.9 % (ref 11.5–15.5)
WBC: 9.4 10*3/uL (ref 4.0–10.5)
nRBC: 1.8 % — ABNORMAL HIGH (ref 0.0–0.2)

## 2018-04-06 LAB — TYPE AND SCREEN
ABO/RH(D): A POS
Antibody Screen: NEGATIVE
Unit division: 0

## 2018-04-06 MED ORDER — DOCUSATE SODIUM 50 MG/5ML PO LIQD
100.0000 mg | Freq: Every day | ORAL | Status: DC | PRN
  Administered 2018-04-06 – 2018-04-10 (×2): 100 mg via ORAL
  Filled 2018-04-06 (×2): qty 10

## 2018-04-06 MED ORDER — SODIUM CHLORIDE 0.9 % IV SOLN
2.0000 g | INTRAVENOUS | Status: DC
  Administered 2018-04-06: 2 g via INTRAVENOUS
  Filled 2018-04-06 (×2): qty 20

## 2018-04-06 MED ORDER — MIDAZOLAM HCL 2 MG/2ML IJ SOLN
1.0000 mg | INTRAMUSCULAR | Status: DC | PRN
  Administered 2018-04-06 – 2018-04-10 (×9): 1 mg via INTRAVENOUS
  Filled 2018-04-06 (×10): qty 2

## 2018-04-06 MED ORDER — BISACODYL 10 MG RE SUPP
10.0000 mg | Freq: Every day | RECTAL | Status: DC | PRN
  Administered 2018-04-06: 10 mg via RECTAL
  Filled 2018-04-06: qty 1

## 2018-04-06 MED ORDER — POLYETHYLENE GLYCOL 3350 17 G PO PACK
17.0000 g | PACK | Freq: Every day | ORAL | Status: DC | PRN
  Administered 2018-04-06 – 2018-04-10 (×2): 17 g via ORAL
  Filled 2018-04-06 (×2): qty 1

## 2018-04-06 NOTE — Progress Notes (Signed)
PHARMACY - PHYSICIAN COMMUNICATION CRITICAL VALUE ALERT - BLOOD CULTURE IDENTIFICATION (BCID)  Kiannah Grunow is an 58 y.o. female who presented to [place] on 03/09/2018   Assessment:  2/2 strep species  Name of physician (or Provider) Contacted: Dr Valeta Harms  Current antibiotics: None  Changes to prescribed antibiotics recommended:  Ceftriaxone 2 g q24h  Results for orders placed or performed during the hospital encounter of 04/02/2018  Blood Culture ID Panel (Reflexed) (Collected: 04/05/2018 12:48 PM)  Result Value Ref Range   Enterococcus species NOT DETECTED NOT DETECTED   Listeria monocytogenes NOT DETECTED NOT DETECTED   Staphylococcus species NOT DETECTED NOT DETECTED   Staphylococcus aureus (BCID) NOT DETECTED NOT DETECTED   Streptococcus species DETECTED (A) NOT DETECTED   Streptococcus agalactiae NOT DETECTED NOT DETECTED   Streptococcus pneumoniae NOT DETECTED NOT DETECTED   Streptococcus pyogenes NOT DETECTED NOT DETECTED   Acinetobacter baumannii NOT DETECTED NOT DETECTED   Enterobacteriaceae species NOT DETECTED NOT DETECTED   Enterobacter cloacae complex NOT DETECTED NOT DETECTED   Escherichia coli NOT DETECTED NOT DETECTED   Klebsiella oxytoca NOT DETECTED NOT DETECTED   Klebsiella pneumoniae NOT DETECTED NOT DETECTED   Proteus species NOT DETECTED NOT DETECTED   Serratia marcescens NOT DETECTED NOT DETECTED   Haemophilus influenzae NOT DETECTED NOT DETECTED   Neisseria meningitidis NOT DETECTED NOT DETECTED   Pseudomonas aeruginosa NOT DETECTED NOT DETECTED   Candida albicans NOT DETECTED NOT DETECTED   Candida glabrata NOT DETECTED NOT DETECTED   Candida krusei NOT DETECTED NOT DETECTED   Candida parapsilosis NOT DETECTED NOT DETECTED   Candida tropicalis NOT DETECTED NOT DETECTED   Levester Fresh, PharmD, BCPS, BCCCP Clinical Pharmacist [phone]  Please check AMION for all [place] numbers  04/06/2018 10:37 AM

## 2018-04-06 NOTE — Progress Notes (Signed)
Referring Physician(s): Yznaga  Supervising Physician: Luanne Bras  Patient Status:  [facility] - In-pt  Chief Complaint: Cerebral aneurysm, stroke, right groin hematoma   Subjective: Intubated, sedated, on precedex and fentanyl drips   Allergies: Patient has no known allergies.  Medications: Prior to Admission medications   Medication Sig Start Date End Date Taking? Authorizing Provider  aspirin 325 MG EC tablet Take 325 mg by mouth daily. " And as needed if headache continues"   Yes [provider]  B-COMPLEX-C PO Take 1 tablet by mouth daily.   Yes [provider]  buPROPion (WELLBUTRIN SR) 150 MG 12 hr tablet Take 150 mg by mouth 2 (two) times daily.   Yes [provider]  celecoxib (CELEBREX) 200 MG capsule Take 200 mg by mouth daily. 5pm   Yes [provider]  clonazePAM (KLONOPIN) 0.5 MG tablet Take 0.5 mg by mouth 2 (two) times daily. 1 tablet in the morning and take 1 tablet at 5p. May take additional half-whole tablet throughout the day as needed for anxiety   Yes [provider]  clopidogrel (PLAVIX) 75 MG tablet Take 75 mg by mouth daily.   Yes [provider]  metoprolol succinate (TOPROL-XL) 25 MG 24 hr tablet Take 12.5 mg by mouth every morning.   Yes [provider]  pantoprazole (PROTONIX) 40 MG tablet Take 40 mg by mouth every morning.   Yes [provider]  polycarbophil (FIBERCON) 625 MG tablet Take 625 mg by mouth 2 (two) times daily.   Yes [provider]  simvastatin (ZOCOR) 20 MG tablet Take 20 mg by mouth every morning.   Yes [provider]  topiramate (TOPAMAX) 25 MG tablet Take 25 mg by mouth daily. 25mg  at night for one week then 50 mg at night thereafter   Yes [provider]  venlafaxine XR (EFFEXOR-XR) 150 MG 24 hr capsule Take 150 mg by mouth 2 (two) times daily.   Yes [provider]     Vital Signs: BP (!) 89/70   Pulse 96    Temp 99.9 F (37.7 C) (Axillary)   Resp (!) 22   Ht 5\' 5"  (1.651 m)   Wt 261 lb 3.9 oz (118.5 kg)   SpO2 100%   BMI 43.47 kg/m   Physical Exam intubated; has responded some to voice; not FC; pupils 44mm equal; no movement of UE's to pain, nonpurposeful LE movement; rt groin ecchymotic with wound vac in place; LE'S cool with some edema, pulses dopplerable  Imaging: Ct Head Wo Contrast  Result Date: 04/03/2018 CLINICAL DATA:  58 y/o  F; follow-up of stroke. EXAM: CT HEAD WITHOUT CONTRAST TECHNIQUE: Contiguous axial images were obtained from the base of the skull through the vertex without intravenous contrast. COMPARISON:  04/02/2018 CT head. 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 Mild interval increase in edema and local mass effect. No herniation. No new stroke, hemorrhage, extra-axial collection, or hydrocephalus. Empty sella turcica. Vascular: ACA stent noted. Calcific atherosclerosis of carotid siphons. No new hyperdense vessel. Skull: Normal. Negative for fracture or focal lesion. Sinuses/Orbits: No acute finding. Other: None. IMPRESSION: 1. 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 Mild interval increase in edema and local mass effect. No herniation. 2. No new acute intracranial abnormality identified. Electronically Signed   By: Kristine Garbe M.D.   On: 04/03/2018 03:22   Mr Brain Wo Contrast  Result Date: 04/03/2018 CLINICAL DATA:  Stroke follow-up EXAM: MRI HEAD WITHOUT CONTRAST TECHNIQUE:  Multiplanar, multiecho pulse sequences of the brain and surrounding structures were obtained without intravenous contrast. COMPARISON:  Brain MRI 03/20/2018 and head CT 04/03/2018 FINDINGS: BRAIN: Compared to the prior MRI of 03/31/2018 there has been progression of bilateral watershed distribution infarcts, left-greater-than-right. The amount of ischemia is worst in the left frontal lobe. There is no midline shift or other mass  effect. Partially empty sella. There is cytotoxic edema greatest in the superior anterior frontal lobes. Multifocal white matter hyperintensity, most commonly due to chronic ischemic microangiopathy. The cerebral and cerebellar volume are age-appropriate. Susceptibility-sensitive sequences show no chronic microhemorrhage or superficial siderosis. VASCULAR: Major intracranial arterial and venous sinus flow voids are normal. SKULL AND UPPER CERVICAL SPINE: Calvarial bone marrow signal is normal. There is no skull base mass. Visualized upper cervical spine and soft tissues are normal. SINUSES/ORBITS: No fluid levels or advanced mucosal thickening. No mastoid or middle ear effusion. The orbits are normal. IMPRESSION: 1. Worsening bilateral watershed distribution infarcts compared to MRI of 03/18/2018. Compared to the CT from earlier today, the distribution appears unchanged, allowing for differences in modality. 2. No hemorrhage or mass effect. Electronically Signed   By: Ulyses Jarred M.D.   On: 04/03/2018 20:33   Dg Chest Portable 1 View  Result Date: 04/06/2018 CLINICAL DATA:  Endotracheal tube position. EXAM: PORTABLE CHEST 1 VIEW COMPARISON:  Radiograph of April 06, 2018. FINDINGS: Stable cardiomegaly. Endotracheal and nasogastric tubes are unchanged in position. Right subclavian catheter is unchanged in position. No pneumothorax or pleural effusion is noted. Stable right upper lobe and left perihilar opacities are noted concerning for pneumonia or possibly edema. Bony thorax is unremarkable. IMPRESSION: Stable bilateral lung opacities are noted concerning for pneumonia or edema. Stable support apparatus. Electronically Signed   By: Marijo Conception, M.D.   On: 04/06/2018 08:16   Dg Chest Port 1 View  Result Date: 04/06/2018 CLINICAL DATA:  Respiratory failure. EXAM: PORTABLE CHEST 1 VIEW COMPARISON:  Radiograph of April 05, 2018. FINDINGS: Endotracheal and nasogastric tubes are unchanged in position.  Right subclavian catheter is unchanged in position. Stable cardiomediastinal silhouette is noted. Increased bilateral perihilar and upper lobe opacities are noted concerning for edema or pneumonia. Bony thorax is unremarkable. IMPRESSION: Stable support apparatus. Increased bilateral perihilar and upper lobe opacities are noted concerning for edema or pneumonia. Electronically Signed   By: Marijo Conception, M.D.   On: 04/06/2018 08:12   Dg Chest Port 1 View  Result Date: 04/05/2018 CLINICAL DATA:  Respiratory failure. EXAM: PORTABLE CHEST 1 VIEW COMPARISON:  04/04/2018. FINDINGS: Endotracheal tube, NG tube, right PICC line stable position. Heart size normal. Mild bilateral multifocal infiltrates/edema are noted. Heart size normal. No pleural effusion or pneumothorax. IMPRESSION: 1.  Lines and tubes in stable position. 2. Mild bilateral multifocal infiltrates/edema noted on today's exam. Aspiration can not be excluded. Electronically Signed   By: Marcello Moores  Register   On: 04/05/2018 06:44   Dg Chest Port 1 View  Result Date: 04/04/2018 CLINICAL DATA:  Respiratory failure EXAM: PORTABLE CHEST 1 VIEW COMPARISON:  Two days ago FINDINGS: Endotracheal tube tip at the clavicular heads. The orogastric tube reaches the stomach. Mild atelectatic type opacity. There is no edema, consolidation, effusion, or pneumothorax. Normal heart size. IMPRESSION: Stable hardware positioning and atelectasis. Electronically Signed   By: Monte Fantasia M.D.   On: 04/04/2018 08:13   [place] Ekg Site Rite  Result Date: 04/03/2018 If Site Rite image not attached, placement could not be confirmed due to current cardiac  rhythm.   Labs:  CBC: Recent Labs    04/04/18 0432 04/05/18 0553 04/05/18 0954 04/05/18 1818 04/06/18 0500  WBC 7.5 6.5  --  7.3 9.4  HGB 8.4* 7.0* 6.8* 8.0* 8.0*  HCT 26.5* 21.8* 20.5* 24.5* 24.1*  PLT 80* 89*  --  94* 105*    COAGS: Recent Labs    03/10/18 1058 03/13/2018 0653 04/02/2018 1608  INR 0.97  1.03 1.24  APTT 25  --  26    BMP: Recent Labs    04/03/18 0724 04/04/18 1045 04/05/18 0553 04/06/18 0500  NA 141 139 140 138  K 4.2 3.3* 3.1* 3.5  CL 118* 117* 113* 111  CO2 19* 17* 22 19*  GLUCOSE 138* 156* 145* 127*  BUN 32* 28* 25* 24*  CALCIUM 8.5* 8.6* 8.6* 8.7*  CREATININE 1.78* 0.97 0.85 0.85  GFRNONAA 32* >60 >60 >60  GFRAA 37* >60 >60 >60    LIVER FUNCTION TESTS: Recent Labs    03/10/18 1058 03/11/18 2024  BILITOT 0.5 0.3  AST 18 16  ALT 17 14  ALKPHOS 101 80  PROT 6.9 5.7*  ALBUMIN 3.8 3.2*    Assessment and Plan: Pt with hx ACOM aneurysm ,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    Electronically Signed: D. Rowe Robert, PA-C 04/06/2018, 11:47 AM   I spent a total of 20 minutes at the the patient's bedside AND on the patient's hospital floor or unit, greater than 50% of which was counseling/coordinating care for cerebral arteriogram with endovascular intervention    Patient ID: Rebecca Cox, female   DOB: 1960/09/08, 58 y.o.   MRN: [id number]

## 2018-04-06 NOTE — Progress Notes (Signed)
STROKE TEAM PROGRESS NOTE   SUBJECTIVE (INTERVAL HISTORY). Husband at the bedside, he is not sure how he wants to proceed at this time. Discussed repeat CT head Monday morning. Offered palliative care and he is currently not ready yet. Continuing ASA and Brillinta, SBP goal > 110, she remains intubated and sedated now on Fentanyl and precedex. No improvement in neuro exam today but is sedated.  Appreciate ICU team.    OBJECTIVE Temp:  [98.5 F (36.9 C)-100.9 F (38.3 C)] 99.7 F (37.6 C) (02/01 0348) Pulse Rate:  [88-125] 98 (02/01 0800) Cardiac Rhythm: Normal sinus rhythm (01/31 2000) Resp:  [9-26] 16 (02/01 0800) BP: (88-163)/(53-99) 103/68 (02/01 0815) SpO2:  [91 %-100 %] 93 % (02/01 0800) FiO2 (%):  [30 %] 30 % (02/01 0740) Weight:  [118.5 kg] 118.5 kg (02/01 0344)  Recent Labs  Lab 04/05/18 1530 04/05/18 1925 04/05/18 2257 04/06/18 0343 04/06/18 0730  GLUCAP 143* 115* 125* 115* 101*   Recent Labs  Lab 04/02/18 0449  04/03/18 0724 04/03/18 1637 04/04/18 0432 04/04/18 1045 04/04/18 1748 04/05/18 0553 04/05/18 1818 04/06/18 0500  NA 139  --  141  --   --  139  --  140  --  138  K 4.6  --  4.2  --   --  3.3*  --  3.1*  --  3.5  CL 115*  --  118*  --   --  117*  --  113*  --  111  CO2 18*  --  19*  --   --  17*  --  22  --  19*  GLUCOSE 127*  --  138*  --   --  156*  --  145*  --  127*  BUN 15  --  32*  --   --  28*  --  25*  --  24*  CREATININE 1.05*  --  1.78*  --   --  0.97  --  0.85  --  0.85  CALCIUM 8.9  --  8.5*  --   --  8.6*  --  8.6*  --  8.7*  MG 1.6*   < > 1.6* 1.5* 1.9  --  1.7 1.6* 1.9  --   PHOS 4.1   < > 3.2 1.9* 1.8*  --  1.6* 1.7* 2.3*  --    < > = values in this interval not displayed.   No results for input(s): AST, ALT, ALKPHOS, BILITOT, PROT, ALBUMIN in the last 168 hours. Recent Labs  Lab 04/05/2018 0653  04/02/18 0449 04/03/18 0730 04/04/18 0432 04/05/18 0553 04/05/18 0954 04/05/18 1818 04/06/18 0500  WBC 7.3   < > 15.6* 19.0* 7.5 6.5   --  7.3 9.4  NEUTROABS 4.9  --  13.1*  --   --   --   --   --   --   HGB 12.8   < > 11.6* 9.9* 8.4* 7.0* 6.8* 8.0* 8.0*  HCT 42.9   < > 34.7* 32.2* 26.5* 21.8* 20.5* 24.5* 24.1*  MCV 91.7   < > 87.0 93.3 89.5 87.9  --  86.9 88.3  PLT 212   < > 152 115* 80* 89*  --  94* 105*   < > = values in this interval not displayed.   Recent Labs  Lab 04/02/18 1236  CKTOTAL 103   No results for input(s): LABPROT, INR in the last 72 hours. Recent Labs    04/05/18 1421  [place]  LABSPEC 1.017  PHURINE 6.0  GLUCOSEU NEGATIVE  HGBUR NEGATIVE  BILIRUBINUR NEGATIVE  KETONESUR NEGATIVE  PROTEINUR 30*  NITRITE NEGATIVE  LEUKOCYTESUR NEGATIVE       Component Value Date/Time   CHOL 175 03/11/2018 0624   TRIG 155 (H) 03/11/2018 0624   HDL 48 03/11/2018 0624   CHOLHDL 3.6 03/11/2018 0624   VLDL 31 03/11/2018 0624   LDLCALC 96 03/11/2018 0624   Lab Results  Component Value Date   HGBA1C 4.7 (L) 03/11/2018      Component Value Date/Time   LABOPIA NONE DETECTED 03/10/2018 1210   COCAINSCRNUR NONE DETECTED 03/10/2018 1210   LABBENZ POSITIVE (A) 03/10/2018 1210   AMPHETMU NONE DETECTED 03/10/2018 1210   THCU NONE DETECTED 03/10/2018 1210   LABBARB NONE DETECTED 03/10/2018 1210    No results for input(s): ETH in the last 168 hours.  I have personally reviewed the radiological images below and agree with the radiology interpretations.  Ct Head Wo Contrast  Result Date: 04/02/2018 CLINICAL DATA:  Stroke, follow-up. EXAM: CT HEAD WITHOUT CONTRAST TECHNIQUE: Contiguous axial images were obtained from the base of the skull through the vertex without intravenous contrast. COMPARISON:  MRI brain 03/18/2018. FINDINGS: Brain: Bilateral watershed territory infarcts are confirmed as hypoattenuation involving the cortex, left greater than right. There is no associated hemorrhage. Left ACA stent is stable in position. There is no hemorrhage or new infarct. Ventricles are of normal size. Insert  pass fluid Vascular: ACA stent is in place. No significant atherosclerotic calcifications are present. There is no hyperdense vessel. Skull: Calvarium is intact. No focal lytic or blastic lesions are present. Sinuses/Orbits: The paranasal sinuses and mastoid air cells are clear. The globes and orbits are within normal limits. IMPRESSION: 1. Expected progression of bilateral watershed territory infarcts, left greater than right. 2. No new infarct territory or hemorrhage. 3. ACA stent. Electronically Signed   By: San Morelle M.D.   On: 04/02/2018 09:51   Ct Head Wo Contrast  Result Date: 03/10/2018 CLINICAL DATA:  Acute onset dizziness and difficulty walking today. EXAM: CT HEAD WITHOUT CONTRAST TECHNIQUE: Contiguous axial images were obtained from the base of the skull through the vertex without intravenous contrast. COMPARISON:  None. FINDINGS: Brain: No evidence of acute infarction, hemorrhage, hydrocephalus, extra-axial collection or mass lesion/mass effect. Vascular: No hyperdense vessel or unexpected calcification. Skull: Normal. Negative for fracture or focal lesion. Sinuses/Orbits: Negative. Other: None. IMPRESSION: Negative head CT. Electronically Signed   By: Inge Rise M.D.   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 Acute onset of right arm and leg weakness. EXAM: MRI HEAD WITHOUT CONTRAST MRA HEAD WITHOUT CONTRAST TECHNIQUE: Multiplanar, multiecho pulse sequences of the brain and surrounding structures were obtained without intravenous contrast. Angiographic images of the head were obtained using MRA technique without contrast. COMPARISON:  Angiographic images same day.  MRI 03/12/2018. FINDINGS: MRI HEAD FINDINGS Brain: The brain shows bilateral watershed distribution cortical and subcortical edema and restricted diffusion, affecting both hemispheres but more  extensive on the left than the right. The question is if this represents true bilateral watershed infarction or if it represents a vaso regulatory disturbance related to spasm. No specific vascular territory infarction is seen. No sign of hemorrhage. Chronic small-vessel ischemic changes affect the brainstem and the cerebral hemispheric white matter as seen previously. No hydrocephalus. No extra-axial collection. Vascular: Major vessels at the base  of the brain show flow. 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 There does appear to be a spasm appearance of the A1 vessel on the left and both anterior cerebral arteries, more extensively the right than the left. 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 No middle cerebral artery abnormality is seen. Posterior circulation vessels continued to show a normal appearance. IMPRESSION: Bilateral watershed distribution cortical and subcortical edema and restricted diffusion, more extensive on the left than the right. Differential diagnosis is true watershed infarction versus vaso regulatory disturbance secondary to arterial spasm, which is demonstrated in the anterior cerebral vessels by MR angiography. 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 Electronically Signed   By: Nelson Chimes M.D.   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 Acute onset of right arm and leg weakness. EXAM: MRI HEAD WITHOUT CONTRAST MRA HEAD WITHOUT CONTRAST TECHNIQUE: Multiplanar, multiecho pulse sequences of the brain and surrounding structures were obtained without intravenous contrast. Angiographic images of the head were obtained using MRA technique without contrast.  COMPARISON:  Angiographic images same day.  MRI 03/12/2018. FINDINGS: MRI HEAD FINDINGS Brain: The brain shows bilateral watershed distribution cortical and subcortical edema and restricted diffusion, affecting both hemispheres but more extensive on the left than the right. The question is if this represents true bilateral watershed infarction or if it represents a vaso regulatory disturbance related to spasm. No specific vascular territory infarction is seen. No sign of hemorrhage. Chronic small-vessel ischemic changes affect the brainstem and the cerebral hemispheric white matter as seen previously. No hydrocephalus. No extra-axial collection. Vascular: Major vessels at the base of the brain show flow. 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 There does appear to be a spasm appearance of the A1 vessel on the left and both anterior cerebral arteries, more extensively the right than the left. 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 No middle cerebral artery abnormality is seen. Posterior circulation vessels continued to show a normal appearance. IMPRESSION: Bilateral watershed distribution cortical and subcortical edema and restricted diffusion, more extensive on the left than the right. Differential diagnosis is true watershed infarction versus vaso regulatory disturbance secondary to arterial spasm, which is demonstrated in the anterior cerebral vessels by MR angiography. 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 Electronically Signed   By: Nelson Chimes M.D.   On: 03/10/2018 16:02   Mr Brain Wo Contrast  Result Date: 03/11/2018 CLINICAL DATA:  Headache, new, malignancy suspected. Dizziness and weakness for 5 days EXAM: MRI HEAD WITHOUT CONTRAST MRA HEAD WITHOUT CONTRAST TECHNIQUE: Multiplanar, multiecho  pulse sequences of the brain and surrounding structures were obtained without intravenous contrast. Angiographic images of the head were obtained using MRA technique without contrast. COMPARISON:  None. FINDINGS: MRI HEAD FINDINGS Brain: No acute infarction, hemorrhage, hydrocephalus, extra-axial collection or mass lesion. Mild patchy FLAIR hyperintensity in the pons. Even milder periventricular FLAIR hyperintensity. Normal brain volume. Partially empty sella considered incidental in isolation. Vascular: Arterial findings below. Normal dural venous sinus flow voids. Skull and upper cervical spine: No evident marrow lesion Sinuses/Orbits: Negative MRA HEAD FINDINGS Left  larger than right ICA in the setting of right A1 hypoplasia. There is a lobulated superiorly directed anterior communicating artery aneurysm measuring 5 mm in width and 3 mm base to dome. No major branch occlusion or flow limiting stenosis. Negative for vessel beading. IMPRESSION: Brain MRI: 1. No acute finding or specific explanation for headache. 2. Mild signal abnormality in the pons and periventricular white matter that is usually from chronic small vessel ischemia. Intracranial MRA: 1. 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 2. No emergent finding. Electronically Signed   By: Monte Fantasia M.D.   On: 03/11/2018 09:08   Mr Brain W Contrast  Result Date: 03/12/2018 CLINICAL DATA:  Liver cancer. Headache. Rule out metastatic disease. EXAM: MRI HEAD WITH CONTRAST TECHNIQUE: Multiplanar, multiecho pulse sequences of the brain and surrounding structures were obtained with intravenous contrast. CONTRAST:  10 mL Gadovist IV COMPARISON:  Unenhanced MRI head 03/11/2018 FINDINGS: Normal enhancement. No enhancing mass lesion. Leptomeningeal enhancement normal. No acute abnormality. See recent unenhanced MRI report. IMPRESSION: No acute abnormality and negative for intracranial metastatic disease. Electronically Signed   By: Franchot Gallo M.D.    On: 03/12/2018 11:16   US Carotid Bilateral (at [facility] And [facility] Only)  Result Date: 03/11/2018 CLINICAL DATA:  58 year old female with history of vertigo EXAM: BILATERAL CAROTID DUPLEX ULTRASOUND TECHNIQUE: Pearline Cables scale imaging, color Doppler and duplex ultrasound were performed of bilateral carotid and vertebral arteries in the neck. COMPARISON:  None. FINDINGS: Criteria: Quantification of carotid stenosis is based on velocity parameters that correlate the residual internal carotid diameter with NASCET-based stenosis levels, using the diameter of the distal internal carotid lumen as the denominator for stenosis measurement. The following velocity measurements were obtained: RIGHT ICA:  Systolic 91 cm/sec, Diastolic 34 cm/sec CCA:  578 cm/sec SYSTOLIC ICA/CCA RATIO:  0.9 ECA:  96 cm/sec LEFT ICA:  Systolic 78 cm/sec, Diastolic 38 cm/sec CCA:  93 cm/sec SYSTOLIC ICA/CCA RATIO:  0.8 ECA:  77 cm/sec Right Brachial SBP: Not acquired Left Brachial SBP: Not acquired RIGHT CAROTID ARTERY: No significant calcified disease of the right common carotid artery. Intermediate waveform maintained. Heterogeneous plaque without significant calcifications at the right carotid bifurcation. Low resistance waveform of the right ICA. No significant tortuosity. RIGHT VERTEBRAL ARTERY: Antegrade flow with low resistance waveform. LEFT CAROTID ARTERY: No significant calcified disease of the left common carotid artery. Intermediate waveform maintained. Heterogeneous plaque at the left carotid bifurcation without significant calcifications. Low resistance waveform of the left ICA. LEFT VERTEBRAL ARTERY:  Antegrade flow with low resistance waveform. Additional: Right thyroid nodule measures 1.8 cm. IMPRESSION: Color duplex indicates minimal heterogeneous plaque, with no hemodynamically significant stenosis by duplex criteria in the extracranial cerebrovascular circulation. Incidental finding of right thyroid nodule measuring 1.8 cm. Dedicated  thyroid ultrasound recommended if further characterisation warranted. Signed, Dulcy Fanny. Dellia Nims, RPVI [facility] Radiology Electronically Signed   By: Corrie Mckusick D.O.   On: 03/11/2018 09:41   Mr Jodene Nam Head/brain IO Cm  Result Date: 03/11/2018 CLINICAL DATA:  Headache, new, malignancy suspected. Dizziness and weakness for 5 days EXAM: MRI HEAD WITHOUT CONTRAST MRA HEAD WITHOUT CONTRAST TECHNIQUE: Multiplanar, multiecho pulse sequences of the brain and surrounding structures were obtained without intravenous contrast. Angiographic images of the head were obtained using MRA technique without contrast. COMPARISON:  None. FINDINGS: MRI HEAD FINDINGS Brain: No acute infarction, hemorrhage, hydrocephalus, extra-axial collection or mass lesion. Mild patchy FLAIR hyperintensity in the pons. Even milder periventricular FLAIR hyperintensity. Normal  brain volume. Partially empty sella considered incidental in isolation. Vascular: Arterial findings below. Normal dural venous sinus flow voids. 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 No major branch occlusion or flow limiting stenosis. Negative for vessel beading. IMPRESSION: Brain MRI: 1. No acute finding or specific explanation for headache. 2. Mild signal abnormality in the pons and periventricular white matter that is usually from chronic small vessel ischemia. Intracranial MRA: 1. 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 2. No emergent finding. Electronically Signed   By: Monte Fantasia M.D.   On: 03/11/2018 09:08   Mr Brain Wo Contrast  Result Date: 04/03/2018 CLINICAL DATA:  Stroke follow-up EXAM: MRI HEAD WITHOUT CONTRAST TECHNIQUE: Multiplanar, multiecho  pulse sequences of the brain and surrounding structures were obtained without intravenous contrast. COMPARISON:  Brain MRI 04/05/2018 and head CT 04/03/2018 FINDINGS: BRAIN: Compared to the prior MRI of 03/22/2018 there has been progression of bilateral watershed distribution infarcts, left-greater-than-right. The amount of ischemia is worst in the left frontal lobe. There is no midline shift or other mass effect. Partially empty sella. There is cytotoxic edema greatest in the superior anterior frontal lobes. Multifocal white matter hyperintensity, most commonly due to chronic ischemic microangiopathy. The cerebral and cerebellar volume are age-appropriate. Susceptibility-sensitive sequences show no chronic microhemorrhage or superficial siderosis. VASCULAR: Major intracranial arterial and venous sinus flow voids are normal. SKULL AND UPPER CERVICAL SPINE: Calvarial bone marrow signal is normal. There is no skull base mass. Visualized upper cervical spine and soft tissues are normal. SINUSES/ORBITS: No fluid levels or advanced mucosal thickening. No mastoid or middle ear effusion. The orbits are normal. IMPRESSION: 1. Worsening bilateral watershed distribution infarcts compared to MRI of 03/22/2018. Compared to the CT from earlier today, the distribution appears unchanged, allowing for differences in modality. 2. No hemorrhage or mass effect. Electronically Signed   By: Ulyses Jarred M.D.   On: 04/03/2018 20:33    PHYSICAL EXAM  Temp:  [98.5 F (36.9 C)-100.9 F (38.3 C)] 99.7 F (37.6 C) (02/01 0348) Pulse Rate:  [88-125] 98 (02/01 0800) Resp:  [9-26] 16 (02/01 0800) BP: (88-163)/(53-99) 103/68 (02/01 0815) SpO2:  [91 %-100 %] 93 % (02/01 0800) FiO2 (%):  [30 %] 30 % (02/01 0740) Weight:  [118.5 kg] 118.5 kg (02/01 0344)  General - Well nourished, well developed, intubated on precedex.  Ophthalmologic - fundi not visualized due to noncooperation.  Cardiovascular - Regular rate and  rhythm.  Neuro - intubated on precedex, eyes open with voice, but not following commands. Eyes mid position, not blinking to visual threat bilaterally, but blinking spontaneously, doll's eyes present, not tracking, PERRL. Oral involuntary movement with chewing. Corneal reflex present bilaterally, gag and cough present. Breathing over the vent.  Facial symmetry not able to test due to ET tube.  Tongue midline in mouth. On pain stimulation, no withdraw in all extremities.  Increased muscle tone right arm and leg> left arm and leg also axial increased tone, BLEs increased muscle tone with extension position, and increased neck tone, difficulty with neck flexion. DTR 1+ and positive bilateral babinski. Sensation, coordination and gait not tested.   ASSESSMENT/PLAN Ms. Rebecca Cox is a 58 y.o. female with history of CKD, CAD, OSA, liver tumor, cerebral aneurysm admitted for right arm  and left leg weakness found after aneurysm coiling. No tPA given due to right groin severe bleeding.    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  Ongoing aggressive stroke risk factor management  Therapy recommendations: Pending  Disposition: Pending, a long discussion with husband at bedside regarding [place] and DNR.  Husband needs time to think about it.  CODE STATUS full code at this time  Vegetative state with increased  muscle tone bilaterally  Patient developed diffuse increased muscle tone in all extremities and the neck  MRI showed bilateral ACA and MCA/ACA infarcts  MRI and repeat extensive bilateral ACA and MCA/ACA infarcts, progressed from prior MRI  EEG no seizure  Resume home Wellbutrin and Effexor as well as Topamax  Resume home Klonopin 0.5 mg twice daily  Supportive care   Hypotension   BP 110s  Now off neo   BP goal > 110  Fever   Tmax 100.9  CXR - Mild bilateral multifocal infiltrates  UA  - unremarkable  Blood culture - pending  CCM on board  Sputum culture - Gram positive cocci - Gram positive rods - Gram negative rods - finale pending  Blood culture - no growth so far  AKI   Creatinine 1.04-1.05-1.78-0.97-0.85  On IVF   oligouria resolved  CCM on board  Close monitoring  ACOM aneurysm  Status post ACOM stent placement  On aspirin and Brilinta  Heparin IV discontinued  Dr.Deveshwar following  Right groin hematoma s/p right deep femoral A repair  Status post cerebral angiogram  VVS on board  P drainage off - on wound vac  Close vascular check  Continue aspirin and Brilinta  Severe anemia due to blood loss  Hemoglobin down from 12.8->6.1, received 4 units PRBC  hemoglobin 11.6-9.9-8.4-7.0-6.8 - PRBC  Stool occult blood pending   Consider CT abdomen to rule out peritoneal hemorrhage if needed.  Hyperlipidemia  Home meds:  Zocor 20   LDL 96, goal < 70  Now on Zocor 40  Continue statin at discharge  Other Stroke Risk Factors  Obesity, Body mass index is 43.47 kg/m.   Obstructive sleep apnea, on CPAP at home  Other Active Problems  Leukocytosis 16.1-15.6-19.0-7.5-6.5 -> 7.3 -> 9.4  Hospital day # 5  This patient is critically ill due to bilateral ACA, MCA/ACA territory infarcts, severe anemia requiring blood transfusion, groin hematoma with femoral artery repair, hypotension, leukocytosis and at significant risk of  neurological worsening, death form recurrent stroke, hemorrhagic conversion, seizure, hypovolemic shock, sepsis.   This patient is critically ill and at significant risk of neurological worsening, death and care requires constant monitoring of vital signs, hemodynamics,respiratory and cardiac monitoring,review of multiple databases, neurological assessment, discussion with family, other specialists and medical decision making of high complexity.I  I spent 30 minutes of neurocritical care time in the care of this patient.  Sarina Ill, MD [facility]  To contact Stroke Continuity provider, please refer to [email or web address] After hours, contact General Neurology

## 2018-04-06 NOTE — Progress Notes (Signed)
Pt ETT was slightly pulled out while turning patient. RT pushed tube back to 23 cm at lip. Chest xray ordered to confirm placement. Patient receiving tidal volumes and O2 saturation is 98%. Good bilateral breath sounds. RT will continue to monitor.

## 2018-04-06 NOTE — Progress Notes (Signed)
NAME:  Rebecca Cox, MRN:  [id number], DOB:  [birth year], LOS: 5 ADMISSION DATE:  03/21/2018, CONSULTATION DATE: 03/31/2018 REFERRING MD:  Patrecia Pour, CHIEF COMPLAINT: Acute respiratory failure  Brief History   This is a 58 year old female with history of liver cancer and cerebral aneurysm who presents to PCCM for vent management postop.  Patient have the aneurysm coiled in during the procedure developed a bifrontal CVA and a right groin hematoma.  She returned to the OR for evacuation of the hematoma and repair of the vessel.  Heparin was reversed and patient was transfused 2 units of packed red blood cells.  Past Medical History   Past Medical History:  Diagnosis Date  . Agoraphobia   . Arthritis   . Cancer ([place])    liver  . Cerebral aneurysm   . Chronic kidney disease    stage 3  . Coronary artery disease   . Depression   . Diverticulitis   . Esophageal hernia   . Family history of adverse reaction to anesthesia    Pt suspects that " mother had cardiac arrest from anesthesia- not exactly sure"   . GERD (gastroesophageal reflux disease)   . Headache   . Sleep apnea    does not wear CPAP  . Thyroid nodule   . Wears glasses   . Wears partial dentures    Significant Hospital Events   1/27 > anterior communicating aneurysm embolization, post procedure code stroke, right femoral hematoma  Consults:  PCCM Neurology Vascular Surgery IR  Procedures:  1/27 >  anterior communicating aneurysm embolization 1/27 > ETT 1/27 R aline >> 1/28  Significant Diagnostic Tests:  MR I 03/12/2018 >>  Bilateral watershed distribution cortical and subcortical edema and restricted diffusion, more extensive on the left than the right.  Differential diagnosis is true watershed infarction versus vaso regulatory disturbance secondary to arterial spasm, which is demonstrated in the anterior cerebral vessels by MR angiography. Previously seen 5 mm anterior communicating region aneurysm remains  visible.  1/28 CTH >> 1. Expected progression of bilateral watershed territory infarcts, left greater than right. 2. No new infarct territory or hemorrhage. 3. ACA stent.  1/28 EEG> abnormal EEG, generalized background slowing. No epileptiform activity.   1/29 CT Head>  Stable watershed distribution bilaterally, new acute intracranial abnormality   1/29 MRI brain> worsening watershed infarcts compared to 1/27 MRI. No mass effect, no hemorrhage.   1/30 ECHO> LVEF 60-65%, no wall motion abnormality CXR 1/31> bilateral opacities; pulm edema pattern  Micro Data:  1/27 MRSA PCR >>neg  Antimicrobials:   1/27 cefazolin preop  Interim history/subjective:  Sedation continues to be an issue overnight.  Now on Precedex and fentanyl drip.  Better sedated.  Per nursing staff.  This morning with turning and rolling the patient there was slight retraction of the endotracheal tube.  This was readvanced by respiratory therapy.  A chest x-ray has been ordered to confirm appropriate positioning.  Objective   Blood pressure 109/64, pulse (!) 106, temperature 99.7 F (37.6 C), temperature source Oral, resp. rate 19, height 5\' 5"  (1.651 m), weight 118.5 kg, SpO2 94 %.    Vent Mode: PRVC FiO2 (%):  [30 %] 30 % Set Rate:  [16 bmp] 16 bmp Vt Set:  [450 mL] 450 mL PEEP:  [5 cmH20] 5 cmH20 Plateau Pressure:  [13 cmH20-17 cmH20] 17 cmH20   Intake/Output Summary (Last 24 hours) at 04/06/2018 0759 Last data filed at 04/06/2018 0700 Gross per 24 hour  Intake 4405.2 ml  Output 1195 ml  Net 3210.2 ml   Filed Weights   04/04/18 0200 04/05/18 0500 04/06/18 0344  Weight: 112.4 kg 113.8 kg 118.5 kg    Examination: General: Obese female, intubated on mechanical ventilation, sedated fentanyl and Cidex.  HEENT: NCAT, endotracheal tube in place, trachea midline, will not open eyes to voice Neuro: Moves extremities spontaneously, sedated minimally responsive CV: Regular rate and rhythm, S1-S2, no MRG PULM:  Bilateral mechanically ventilated breath sounds GI: Soft abdomen, obese pannus, bruising of the right lower abdominal pannus Extremities: Bruising into the right groin from hematoma, bilateral dependent edema in the upper and lower extremities Skin: Bruising as stated above, no rash  Resolved Hospital Problem list   AKI  Assessment & Plan:     ACOM aneurysm s/p embolization -Intraoperative bilateral ACA CVA, L> R -ACA and MCA/MCA watershed infarcts - CT head 1/29 no progression of watershed injury -EEG 1/28 no epileptiform activity, generalized background slowing P:  Neurology following for stroke management we appreciate the recommendations Continuing aspirin and Brilinta per interventional neuro Per protocol neuro exams SBP goal greater than 110 mmHg Continuing goals of care discussions per primary  Acute respiratory insufficiency requiring mechanical ventilation -post operative insufficiency, contributing factors include CVA, sedation -1/31 CXR bilateral opacities, possible pulm edema.  P:  Chest x-ray plan for this morning to follow-up on tube placement due to retraction with moving in the bed. Review the chest x-ray after shot in the room appears to have appropriate endotracheal tube placement. The patient's images have been independently reviewed by me.    At this point patient is not candidate for liberation from mechanical ventilation due to her mental status.  She is at significant risk for need of prolonged mechanical ventilatory support.  Currently appears there is ongoing goals of care discussion regarding if patient would want tracheostomy tube for this purpose. Pulmonary will be glad to answer any questions regarding prolonged mechanical ventilation.  Right groin hematoma, s/p repair  -s/p 2 PRBC 1/27 -JP drain removed 1/30, wound VAC remains in place P:  Appreciate vascular surgery input and expertise Wound VAC in place H&H stable we will continue to  follow  Acute Anemia (1/31) -etiology hemodilution related to IVF vs acute blood loss  -Hgb 7 from 8.4 s/p JP drain removal, RN noted oozing around removal site late 1/30.  P Conservative threshold for transfusion, transfuse to maintain greater than 7   Electrolyte abnormality -Hypomagnesemia -Hypophosphatemia    -Hypokalemia P:  We will replace electrolytes as needed, continue to monitor with metabolic panel and labs daily  Diabetes Mellitus  P:  Continue to follow CBGs If needed will use SSI  Malnutrition, at risk P Tube feeding protocol  Best practice:  Diet: Enteral nutrition Pain/Anxiety/Delirium protocol (if indicated): Precedex gtt, adding seroquel BID, PRN fent and PRN versed VAP protocol (if indicated): Yes DVT prophylaxis:  SCD, SQ Heparin  GI prophylaxis: protonix Glucose control: q4CBG Mobility: Bedrest Code Status: Full  Family Communication: No family at bedside at time of NP exam.  Disposition:  Continue ICU level of care   Labs   CBC:  Recent Labs  Lab 03/09/2018 0653  04/02/18 0449 04/03/18 0730 04/04/18 0432 04/05/18 0553 04/05/18 0954 04/05/18 1818 04/06/18 0500  WBC 7.3   < > 15.6* 19.0* 7.5 6.5  --  7.3 9.4  NEUTROABS 4.9  --  13.1*  --   --   --   --   --   --   HGB 12.8   < >  11.6* 9.9* 8.4* 7.0* 6.8* 8.0* 8.0*  HCT 42.9   < > 34.7* 32.2* 26.5* 21.8* 20.5* 24.5* 24.1*  MCV 91.7   < > 87.0 93.3 89.5 87.9  --  86.9 88.3  PLT 212   < > 152 115* 80* 89*  --  94* 105*   < > = values in this interval not displayed.    Basic Metabolic Panel: Recent Labs  Lab 04/02/18 0449  04/03/18 0724 04/03/18 1637 04/04/18 0432 04/04/18 1045 04/04/18 1748 04/05/18 0553 04/05/18 1818 04/06/18 0500  NA 139  --  141  --   --  139  --  140  --  138  K 4.6  --  4.2  --   --  3.3*  --  3.1*  --  3.5  CL 115*  --  118*  --   --  117*  --  113*  --  111  CO2 18*  --  19*  --   --  17*  --  22  --  19*  GLUCOSE 127*  --  138*  --   --  156*  --  145*   --  127*  BUN 15  --  32*  --   --  28*  --  25*  --  24*  CREATININE 1.05*  --  1.78*  --   --  0.97  --  0.85  --  0.85  CALCIUM 8.9  --  8.5*  --   --  8.6*  --  8.6*  --  8.7*  MG 1.6*   < > 1.6* 1.5* 1.9  --  1.7 1.6* 1.9  --   PHOS 4.1   < > 3.2 1.9* 1.8*  --  1.6* 1.7* 2.3*  --    < > = values in this interval not displayed.   GFR: Estimated Creatinine Clearance: 96.3 mL/min (by C-G formula based on SCr of 0.85 mg/dL). Recent Labs  Lab 04/04/18 0432 04/05/18 0553 04/05/18 1818 04/06/18 0500  WBC 7.5 6.5 7.3 9.4    Liver Function Tests: No results for input(s): AST, ALT, ALKPHOS, BILITOT, PROT, ALBUMIN in the last 168 hours. No results for input(s): LIPASE, AMYLASE in the last 168 hours. No results for input(s): AMMONIA in the last 168 hours.  ABG    Component Value Date/Time   PHART 7.318 (L) 04/02/2018 0413   PCO2ART 37.9 04/02/2018 0413   PO2ART 166 (H) 04/02/2018 0413   HCO3 18.9 (L) 04/02/2018 0413   TCO2 20 (L) 03/14/2018 1320   ACIDBASEDEF 6.1 (H) 04/02/2018 0413   O2SAT 98.6 04/02/2018 0413     Coagulation Profile: Recent Labs  Lab 03/31/2018 0653 03/19/2018 1608  INR 1.03 1.24    Cardiac Enzymes: Recent Labs  Lab 04/02/18 1236  CKTOTAL 103    HbA1C: Hgb A1c MFr Bld  Date/Time Value Ref Range Status  03/11/2018 06:22 AM 4.7 (L) 4.8 - 5.6 % Final    Comment:    (NOTE) Pre diabetes:          5.7%-6.4% Diabetes:              >6.4% Glycemic control for   <7.0% adults with diabetes     CBG: Recent Labs  Lab 04/05/18 1530 04/05/18 1925 04/05/18 2257 04/06/18 0343 04/06/18 0730  GLUCAP 143* 115* 125* 115* 101*     This patient is critically ill with multiple organ system failure; which, requires frequent high complexity decision making, assessment,  support, evaluation, and titration of therapies. This was completed through the application of advanced monitoring technologies and extensive interpretation of multiple databases. During this  encounter critical care time was devoted to patient care services described in this note for 37 minutes.  Garner Nash, DO [facility] 04/06/2018 7:59 AM  Personal pager: [phone] If unanswered, please page CCM On-call: [phone]

## 2018-04-06 DEATH — deceased

## 2018-04-07 DIAGNOSIS — E8770 Fluid overload, unspecified: Secondary | ICD-10-CM

## 2018-04-07 DIAGNOSIS — J159 Unspecified bacterial pneumonia: Secondary | ICD-10-CM

## 2018-04-07 DIAGNOSIS — J156 Pneumonia due to other aerobic Gram-negative bacteria: Secondary | ICD-10-CM

## 2018-04-07 DIAGNOSIS — R7881 Bacteremia: Secondary | ICD-10-CM

## 2018-04-07 LAB — GLUCOSE, CAPILLARY
GLUCOSE-CAPILLARY: 99 mg/dL (ref 70–99)
Glucose-Capillary: 97 mg/dL (ref 70–99)
Glucose-Capillary: 101 mg/dL — ABNORMAL HIGH (ref 70–99)
Glucose-Capillary: 91 mg/dL (ref 70–99)
Glucose-Capillary: 106 mg/dL — ABNORMAL HIGH (ref 70–99)
Glucose-Capillary: 106 mg/dL — ABNORMAL HIGH (ref 70–99)

## 2018-04-07 LAB — BASIC METABOLIC PANEL
ANION GAP: 6 (ref 5–15)
BUN: 24 mg/dL — ABNORMAL HIGH (ref 6–20)
CALCIUM: 8.2 mg/dL — AB (ref 8.9–10.3)
CO2: 19 mmol/L — ABNORMAL LOW (ref 22–32)
CREATININE: 0.9 mg/dL (ref 0.44–1.00)
Chloride: 116 mmol/L — ABNORMAL HIGH (ref 98–111)
GFR calc non Af Amer: >60 mL/min (ref >60)
Glucose, Bld: 116 mg/dL — ABNORMAL HIGH (ref 70–99)
Potassium: 3.6 mmol/L (ref 3.5–5.1)
SODIUM: 141 mmol/L (ref 135–145)

## 2018-04-07 LAB — CBC
HCT: 23.9 % — ABNORMAL LOW (ref 36.0–46.0)
Hemoglobin: 7.6 g/dL — ABNORMAL LOW (ref 12.0–15.0)
MCH: 28.6 pg (ref 26.0–34.0)
MCHC: 31.8 g/dL (ref 30.0–36.0)
MCV: 89.8 fL (ref 80.0–100.0)
PLATELETS: 121 10*3/uL — AB (ref 150–400)
RBC: 2.66 MIL/uL — ABNORMAL LOW (ref 3.87–5.11)
RDW: 15.6 % — ABNORMAL HIGH (ref 11.5–15.5)
WBC: 13.4 10*3/uL — ABNORMAL HIGH (ref 4.0–10.5)
nRBC: 1.6 % — ABNORMAL HIGH (ref 0.0–0.2)

## 2018-04-07 MED ORDER — SODIUM CHLORIDE 0.9 % IV SOLN
2.0000 g | Freq: Three times a day (TID) | INTRAVENOUS | Status: DC
  Administered 2018-04-07 – 2018-04-09 (×6): 2 g via INTRAVENOUS
  Filled 2018-04-07 (×7): qty 2

## 2018-04-07 MED ORDER — PANTOPRAZOLE SODIUM 40 MG PO PACK
40.0000 mg | PACK | Freq: Every day | ORAL | Status: DC
  Administered 2018-04-07 – 2018-04-10 (×4): 40 mg
  Filled 2018-04-07 (×4): qty 20

## 2018-04-07 MED ORDER — VENLAFAXINE HCL 50 MG PO TABS
100.0000 mg | ORAL_TABLET | Freq: Two times a day (BID) | ORAL | Status: DC
  Administered 2018-04-07 – 2018-04-10 (×6): 100 mg
  Filled 2018-04-07 (×6): qty 2

## 2018-04-07 MED ORDER — SODIUM CHLORIDE 0.9 % IV SOLN: 2.0000 g | INTRAVENOUS | Status: DC

## 2018-04-07 MED ORDER — FENTANYL 2500MCG IN NS 250ML (10MCG/ML) PREMIX INFUSION
25.0000 ug/h | INTRAVENOUS | Status: DC
  Administered 2018-04-07 – 2018-04-08 (×2): 150 ug/h via INTRAVENOUS
  Administered 2018-04-08: 350 ug/h via INTRAVENOUS
  Administered 2018-04-09: 50 ug/h via INTRAVENOUS
  Administered 2018-04-10: 100 ug/h via INTRAVENOUS
  Filled 2018-04-07 (×5): qty 250

## 2018-04-07 MED ORDER — FENTANYL CITRATE (PF) 100 MCG/2ML IJ SOLN: 50.0000 ug | Freq: Once | INTRAMUSCULAR | Status: DC

## 2018-04-07 MED ORDER — FENTANYL BOLUS VIA INFUSION
50.0000 ug | INTRAVENOUS | Status: DC | PRN
  Filled 2018-04-07: qty 50

## 2018-04-07 NOTE — Progress Notes (Signed)
Referring Physician(s): P[place]osuc  Supervising Physician: Luanne Bras  Patient Status:  Rebecca Cox - In-pt  Chief Complaint: Cerebral aneurysm,  stroke, right groin hematoma   Subjective: No acute changes; intubated, still on precedex/fentanyl  Allergies: Patient has no known allergies.  Medications: Prior to Admission medications   Medication Sig Start Date End Date Taking? Authorizing Provider  aspirin 325 MG EC tablet Take 325 mg by mouth daily. " And as needed if headache continues"   Yes [provider]  B-COMPLEX-C PO Take 1 tablet by mouth daily.   Yes [provider]  buPROPion (WELLBUTRIN SR) 150 MG 12 hr tablet Take 150 mg by mouth 2 (two) times daily.   Yes [provider]  celecoxib (CELEBREX) 200 MG capsule Take 200 mg by mouth daily. 5pm   Yes [provider]  clonazePAM (KLONOPIN) 0.5 MG tablet Take 0.5 mg by mouth 2 (two) times daily. 1 tablet in the morning and take 1 tablet at 5p. May take additional half-whole tablet throughout the day as needed for anxiety   Yes [provider]  clopidogrel (PLAVIX) 75 MG tablet Take 75 mg by mouth daily.   Yes [provider]  metoprolol succinate (TOPROL-XL) 25 MG 24 hr tablet Take 12.5 mg by mouth every morning.   Yes [provider]  pantoprazole (PROTONIX) 40 MG tablet Take 40 mg by mouth every morning.   Yes [provider]  polycarbophil (FIBERCON) 625 MG tablet Take 625 mg by mouth 2 (two) times daily.   Yes [provider]  simvastatin (ZOCOR) 20 MG tablet Take 20 mg by mouth every morning.   Yes [provider]  topiramate (TOPAMAX) 25 MG tablet Take 25 mg by mouth daily. 25mg  at night for one week then 50 mg at night thereafter   Yes [provider]  venlafaxine XR (EFFEXOR-XR) 150 MG 24 hr capsule Take 150 mg by mouth 2 (two) times daily.   Yes [provider]     Vital Signs: BP 114/76   Pulse (!) 103    Temp (!) 100.9 F (38.3 C) (Axillary)   Resp 14   Ht 5\' 5"  (1.651 m)   Wt 263 lb 10.7 oz (119.6 kg)   SpO2 92%   BMI 43.88 kg/m   Physical Exam intubated; pupils 2 mm equal; no movement to pain upper or lower ext; rt groin soft with wound vac in place/ scatt ecchymoses bilat; distal pulses 1+/persistent edema  Imaging: Mr Brain Wo Contrast  Result Date: 04/03/2018 CLINICAL DATA:  Stroke follow-up EXAM: MRI HEAD WITHOUT CONTRAST TECHNIQUE: Multiplanar, multiecho pulse sequences of the brain and surrounding structures were obtained without intravenous contrast. COMPARISON:  Brain MRI 03/09/2018 and head CT 04/03/2018 FINDINGS: BRAIN: Compared to the prior MRI of 03/13/2018 there has been progression of bilateral watershed distribution infarcts, left-greater-than-right. The amount of ischemia is worst in the left frontal lobe. There is no midline shift or other mass effect. Partially empty sella. There is cytotoxic edema greatest in the superior anterior frontal lobes. Multifocal white matter hyperintensity, most commonly due to chronic ischemic microangiopathy. The cerebral and cerebellar volume are age-appropriate. Susceptibility-sensitive sequences show no chronic microhemorrhage or superficial siderosis. VASCULAR: Major intracranial arterial and venous sinus flow voids are normal. SKULL AND UPPER CERVICAL SPINE: Calvarial bone marrow signal is normal. There is no skull base mass. Visualized upper cervical spine and soft tissues are normal. SINUSES/ORBITS: No fluid levels or advanced mucosal thickening. No mastoid or middle ear  effusion. The orbits are normal. IMPRESSION: 1. Worsening bilateral watershed distribution infarcts compared to MRI of 03/20/2018. Compared to the CT from earlier today, the distribution appears unchanged, allowing for differences in modality. 2. No hemorrhage or mass effect. Electronically Signed   By: Ulyses Jarred M.D.   On: 04/03/2018 20:33   Dg Chest Portable 1  View  Result Date: 04/06/2018 CLINICAL DATA:  Endotracheal tube position. EXAM: PORTABLE CHEST 1 VIEW COMPARISON:  Radiograph of April 06, 2018. FINDINGS: Stable cardiomegaly. Endotracheal and nasogastric tubes are unchanged in position. Right subclavian catheter is unchanged in position. No pneumothorax or pleural effusion is noted. Stable right upper lobe and left perihilar opacities are noted concerning for pneumonia or possibly edema. Bony thorax is unremarkable. IMPRESSION: Stable bilateral lung opacities are noted concerning for pneumonia or edema. Stable support apparatus. Electronically Signed   By: Marijo Conception, M.D.   On: 04/06/2018 08:16   Dg Chest Port 1 View  Result Date: 04/06/2018 CLINICAL DATA:  Respiratory failure. EXAM: PORTABLE CHEST 1 VIEW COMPARISON:  Radiograph of April 05, 2018. FINDINGS: Endotracheal and nasogastric tubes are unchanged in position. Right subclavian catheter is unchanged in position. Stable cardiomediastinal silhouette is noted. Increased bilateral perihilar and upper lobe opacities are noted concerning for edema or pneumonia. Bony thorax is unremarkable. IMPRESSION: Stable support apparatus. Increased bilateral perihilar and upper lobe opacities are noted concerning for edema or pneumonia. Electronically Signed   By: Marijo Conception, M.D.   On: 04/06/2018 08:12   Dg Chest Port 1 View  Result Date: 04/05/2018 CLINICAL DATA:  Respiratory failure. EXAM: PORTABLE CHEST 1 VIEW COMPARISON:  04/04/2018. FINDINGS: Endotracheal tube, NG tube, right PICC line stable position. Heart size normal. Mild bilateral multifocal infiltrates/edema are noted. Heart size normal. No pleural effusion or pneumothorax. IMPRESSION: 1.  Lines and tubes in stable position. 2. Mild bilateral multifocal infiltrates/edema noted on today's exam. Aspiration can not be excluded. Electronically Signed   By: Marcello Moores  Register   On: 04/05/2018 06:44   Dg Chest Port 1 View  Result Date:  04/04/2018 CLINICAL DATA:  Respiratory failure EXAM: PORTABLE CHEST 1 VIEW COMPARISON:  Two days ago FINDINGS: Endotracheal tube tip at the clavicular heads. The orogastric tube reaches the stomach. Mild atelectatic type opacity. There is no edema, consolidation, effusion, or pneumothorax. Normal heart size. IMPRESSION: Stable hardware positioning and atelectasis. Electronically Signed   By: Monte Fantasia M.D.   On: 04/04/2018 08:13    Labs:  CBC: Recent Labs    04/05/18 0553 04/05/18 0954 04/05/18 1818 04/06/18 0500 04/07/18 0556  WBC 6.5  --  7.3 9.4 13.4*  HGB 7.0* 6.8* 8.0* 8.0* 7.6*  HCT 21.8* 20.5* 24.5* 24.1* 23.9*  PLT 89*  --  94* 105* 121*    COAGS: Recent Labs    03/10/18 1058 03/31/2018 0653 04/05/2018 1608  INR 0.97 1.03 1.24  APTT 25  --  26    BMP: Recent Labs    04/04/18 1045 04/05/18 0553 04/06/18 0500 04/07/18 0556  NA 139 140 138 141  K 3.3* 3.1* 3.5 3.6  CL 117* 113* 111 116*  CO2 17* 22 19* 19*  GLUCOSE 156* 145* 127* 116*  BUN 28* 25* 24* 24*  CALCIUM 8.6* 8.6* 8.7* 8.2*  CREATININE 0.97 0.85 0.85 0.90  GFRNONAA >60 >60 >60 >60  GFRAA >60 >60 >60 >60    LIVER FUNCTION TESTS: Recent Labs    03/10/18 1058 03/11/18 2024  BILITOT 0.5 0.3  AST 18  16  ALT 17 14  ALKPHOS 101 80  PROT 6.9 5.7*  ALBUMIN 3.8 3.2*    Assessment and Plan: Pt with hx ACOM aneurysm ,s/p embolization using an ATLAS neuroform stent 03/20/2018; rt groin access site bleed/hematoma postprocedure requiring operative repair of deep fem artery 1/27; bilateral watershed distribution infarcts post procedure; on vent/sedated; on Brilinta, asa; BP 114/76; temp 100.9; WBC 13.4(9.4), hgb 7.6(8.0), creat 0.9, trach asp with few e coli, blood cx- strept anginosis; poss PNA on CXR- antbx per CCM; [place] meeting planned next week; additional plans as per neuro/CCM/VVS.   Electronically Signed: D. Rowe Robert, PA-C 04/07/2018, 12:37 PM   I spent a total of 15 minutes at the the  patient's bedside AND on the patient's hospital floor or unit, greater than 50% of which was counseling/coordinating care for cerebral arteriogram with endovascular intervention    Patient ID: Rebecca Cox, female   DOB: 1960/06/18, 58 y.o.   MRN: [id number]

## 2018-04-07 NOTE — Progress Notes (Signed)
RT NOTE: MD place patient on CPAP/PSV. Per MD patient seems more comfortable on CPAP/PSV and stated to leave patient on wean for as long as patient can tolerate.

## 2018-04-07 NOTE — Progress Notes (Addendum)
[facility] Progress Note Patient Name: Zunairah Devers DOB: 1960-06-08 MRN: [id number]   Date of Service  04/07/2018  HPI/Events of Note  Patient still has dys-synchrony with ventilator on 80%/PRVC 16/TV 450/P 5. Spontaneous RR = 22-28.   eICU Interventions  Will order: 1. Increase PRVC rate to 22 and PEEP to 8.  2. Repeat ABG at 1 AM. 3. Portable CXR now.  4. Will ask ground team to evaluate at bedside.      Intervention Category Major Interventions: Respiratory failure - evaluation and management  Sommer,Steven Eugene 04/07/2018, 11:46 PM

## 2018-04-07 NOTE — Progress Notes (Signed)
Chest pt not done this round. Patient really anxious. RN contacted MD about more sedation. Will continue next rounds.

## 2018-04-07 NOTE — Progress Notes (Signed)
[facility] Progress Note Patient Name: Rebecca Cox DOB: 1960/09/23 MRN: [id number]   Date of Service  04/07/2018  HPI/Events of Note  Hypoxia - d/t ventilator dys-synchrony?   eICU Interventions  Please titrate Fentanyl IV infusion up as already ordered.      Intervention Category Major Interventions: Hypoxemia - evaluation and management;Respiratory failure - evaluation and management  Lysle Dingwall 04/07/2018, 10:27 PM

## 2018-04-07 NOTE — Progress Notes (Signed)
Fio2 dropped from 70% to 50% per Sp02 100%.

## 2018-04-07 NOTE — Progress Notes (Signed)
Ice packs applied due to pt temp still elevated. MD was called and notified.

## 2018-04-07 NOTE — Progress Notes (Signed)
Gave tylenol due to pt's increased temp of 100.9.  Also, called respiratory to come assess pt due to O2 stas ranging between 88-91...  Sherril Cong, RN

## 2018-04-07 NOTE — Progress Notes (Signed)
CPT help due to pt being agitated and sp02 dropped into the lew 80's.

## 2018-04-07 NOTE — Progress Notes (Signed)
Pharmacy Antibiotic Note  Rebecca Cox is a 58 y.o. female admitted on 03/15/2018 with sepsis.   Plan: Dc ceftriaxone Cefepime 2 g q8 Monitor renal fx cx lot  Height: 5\' 5"  (165.1 cm) Weight: 263 lb 10.7 oz (119.6 kg) IBW/kg (Calculated) : 57  Temp (24hrs), Avg:100.4 F (38 C), Min:99 F (37.2 C), Max:101.6 F (38.7 C)  Recent Labs  Lab 04/03/18 0724  04/04/18 0432 04/04/18 1045 04/05/18 0553 04/05/18 1818 04/06/18 0500 04/07/18 0556  WBC  --    < > 7.5  --  6.5 7.3 9.4 13.4*  CREATININE 1.78*  --   --  0.97 0.85  --  0.85 0.90   < > = values in this interval not displayed.    Estimated Creatinine Clearance: 91.4 mL/min (by C-G formula based on SCr of 0.9 mg/dL).    No Known Allergies  Levester Fresh, PharmD, BCPS, BCCCP Clinical Pharmacist [phone]  Please check AMION for all [facility] numbers  04/07/2018 10:09 AM

## 2018-04-07 NOTE — Progress Notes (Signed)
NAME:  Rebecca Cox, MRN:  [id number], DOB:  1960/08/27, LOS: 6 ADMISSION DATE:  03/25/2018, CONSULTATION DATE: 03/17/2018 REFERRING MD:  Patrecia Pour, CHIEF COMPLAINT: Acute respiratory failure  Brief History   This is a 58 year old female with history of liver cancer and cerebral aneurysm who presents to PCCM for vent management postop.  Patient have the aneurysm coiled in during the procedure developed a bifrontal CVA and a right groin hematoma.  She returned to the OR for evacuation of the hematoma and repair of the vessel.  Heparin was reversed and patient was transfused 2 units of packed red blood cells.  Past Medical History   Past Medical History:  Diagnosis Date  . Agoraphobia   . Arthritis   . Cancer ([place])    liver  . Cerebral aneurysm   . Chronic kidney disease    stage 3  . Coronary artery disease   . Depression   . Diverticulitis   . Esophageal hernia   . Family history of adverse reaction to anesthesia    Pt suspects that " mother had cardiac arrest from anesthesia- not exactly sure"   . GERD (gastroesophageal reflux disease)   . Headache   . Sleep apnea    does not wear CPAP  . Thyroid nodule   . Wears glasses   . Wears partial dentures    Significant Hospital Events   1/27 > anterior communicating aneurysm embolization, post procedure code stroke, right femoral hematoma  Consults:  PCCM Neurology Vascular Surgery IR  Procedures:  1/27 >  anterior communicating aneurysm embolization 1/27 > ETT 1/27 R aline >> 1/28  Significant Diagnostic Tests:  MR I 03/21/2018 >>  Bilateral watershed distribution cortical and subcortical edema and restricted diffusion, more extensive on the left than the right.  Differential diagnosis is true watershed infarction versus vaso regulatory disturbance secondary to arterial spasm, which is demonstrated in the anterior cerebral vessels by MR angiography. Previously seen 5 mm anterior communicating region aneurysm remains  visible.  1/28 CTH >> 1. Expected progression of bilateral watershed territory infarcts, left greater than right. 2. No new infarct territory or hemorrhage. 3. ACA stent.  1/28 EEG> abnormal EEG, generalized background slowing. No epileptiform activity.   1/29 CT Head>  Stable watershed distribution bilaterally, new acute intracranial abnormality   1/29 MRI brain> worsening watershed infarcts compared to 1/27 MRI. No mass effect, no hemorrhage.   1/30 ECHO> LVEF 60-65%, no wall motion abnormality CXR 1/31> bilateral opacities; pulm edema pattern  Micro Data:  1/27 MRSA PCR >>neg  Antimicrobials:   1/27 cefazolin preop  Interim history/subjective:  No issues overnight.  Once again up on sedation due to "dyssynchrony" with the ventilator.  Objective   Blood pressure 116/76, pulse 98, temperature 100.3 F (37.9 C), temperature source Axillary, resp. rate (!) 24, height 5\' 5"  (1.651 m), weight 119.6 kg, SpO2 98 %.    Vent Mode: CPAP;PSV FiO2 (%):  [30 %-60 %] 50 % Set Rate:  [16 bmp] 16 bmp Vt Set:  [450 mL] 450 mL PEEP:  [5 cmH20] 5 cmH20 Pressure Support:  [3 cmH20] 3 cmH20 Plateau Pressure:  [17 cmH20-20 cmH20] 17 cmH20   Intake/Output Summary (Last 24 hours) at 04/07/2018 0948 Last data filed at 04/07/2018 0800 Gross per 24 hour  Intake 4051.78 ml  Output 1550 ml  Net 2501.78 ml   Filed Weights   04/05/18 0500 04/06/18 0344 04/07/18 0500  Weight: 113.8 kg 118.5 kg 119.6 kg    Examination: General: Obese  female, intubated on mechanical ventilation, sedated with fentanyl and Precedex HEENT: NCAT, endotracheal tube in place, trachea midline, will not open eyes to voice Neuro: Moves all 4 extremities spontaneously when sedation is lightened CV: Rate and rhythm, S1-S2, no MRG PULM: Bilateral mechanically ventilated breath sounds, no rhonchi GI: Soft, nontender, nondistended, some bruising along the lower right quadrant Extremities: Bruising of the right groin, bilateral  dependent edema Skin: Ecchymosis as stated above  Resolved Hospital Problem list   AKI  Assessment & Plan:    ACOM aneurysm s/p embolization -Intraoperative bilateral ACA CVA, L> R -ACA and MCA/MCA watershed infarcts - CT head 1/29 no progression of watershed injury -EEG 1/28 no epileptiform activity, generalized background slowing P:  Neurology following for stroke management we appreciate the recommendations Continue aspirin and Brilinta per neuro interventional Protocol as neuro exams SBP goal greater than 110 Can use Neo-Synephrine to maintain blood pressure greater than systolic goals Titrate Neo-Synephrine to maintain systolic blood pressure greater than 110  Acute respiratory insufficiency requiring mechanical ventilation -post operative insufficiency, contributing factors include CVA, sedation -1/31 CXR bilateral opacities, possible pulm edema.  P:  At this point the patient is not a candidate for liberation from mechanical ventilation due to her mental status.  Even with sedation lightened she does not have the capacity to follow commands for removal of the endotracheal tube and likely inability to protect her airway.  At this point she is likely going to need consideration for tracheostomy tube if they would want prolonged mechanical ventilatory support.  Goals of care discussion with family is warranted.  Pulmonary will be happy to answer any questions regarding the process and outcome of prolonged mechanical ventilation with tracheostomy.  Positive cumulative fluid balance, hypervolemia Patient has a significant positive cumulative fluid balance  Gram-positive pneumonia, gram-positive bacteremia Bilateral infiltrates on chest x-ray, leukocytosis Low-grade temperatures -Would be concerned for potential developing pneumonia -2 out of 2 blood cultures positive for gram-positive cocci in chains -Respiratory culture positive for gram-positive cocci and negative rods -We  will change ceftriaxone to cefepime and wait for speciation of gram-negative rods in sputum  Right groin hematoma, s/p repair  -s/p 2 PRBC 1/27 -JP drain removed 1/30, wound VAC remains in place P:  H&H stable, wound VAC in place we appreciate vascular surgery input and expertise  Acute Anemia (1/31) -etiology hemodilution related to IVF vs acute blood loss  -Hgb 7 from 8.4 s/p JP drain removal, RN noted oozing around removal site late 1/30.  P Conservative threshold for transfusion goal hemoglobin greater than 7   Electrolyte abnormality -Hypomagnesemia -Hypophosphatemia    -Hypokalemia P:  We will replace electrolytes as needed and continue to follow with basic metabolic panel  Diabetes Mellitus  P:  Continue to follow CBGs If needed will use SSI  Malnutrition, at risk P Tube feeding protocol  Agitation, delirium - Continue Precedex and fentanyl - Attempt to avoid excess use of benzodiazepines -I have stopped atypical antipsychotics -Continue patient's PTA topiramate and SSRI. -Continue PTA Klonopin.  Best practice:  Diet: Enteral nutrition Pain/Anxiety/Delirium protocol (if indicated): Precedex gtt, PRN fent and PRN versed VAP protocol (if indicated): Yes DVT prophylaxis:  SCD, SQ Heparin  GI prophylaxis: protonix Glucose control: q4CBG Mobility: Bedrest Code Status: Full  Family Communication: No family at bedside at time of NP exam.  Disposition:  Continue ICU level of care   Labs   CBC:  Recent Labs  Lab 04/02/2018 6440  04/02/18 0449  04/04/18 0432 04/05/18 3474  04/05/18 0954 04/05/18 1818 04/06/18 0500 04/07/18 0556  WBC 7.3   < > 15.6*   < > 7.5 6.5  --  7.3 9.4 13.4*  NEUTROABS 4.9  --  13.1*  --   --   --   --   --   --   --   HGB 12.8   < > 11.6*   < > 8.4* 7.0* 6.8* 8.0* 8.0* 7.6*  HCT 42.9   < > 34.7*   < > 26.5* 21.8* 20.5* 24.5* 24.1* 23.9*  MCV 91.7   < > 87.0   < > 89.5 87.9  --  86.9 88.3 89.8  PLT 212   < > 152   < > 80* 89*  --  94*  105* 121*   < > = values in this interval not displayed.    Basic Metabolic Panel: Recent Labs  Lab 04/03/18 0724 04/03/18 1637 04/04/18 0432 04/04/18 1045 04/04/18 1748 04/05/18 0553 04/05/18 1818 04/06/18 0500 04/07/18 0556  NA 141  --   --  139  --  140  --  138 141  K 4.2  --   --  3.3*  --  3.1*  --  3.5 3.6  CL 118*  --   --  117*  --  113*  --  111 116*  CO2 19*  --   --  17*  --  22  --  19* 19*  GLUCOSE 138*  --   --  156*  --  145*  --  127* 116*  BUN 32*  --   --  28*  --  25*  --  24* 24*  CREATININE 1.78*  --   --  0.97  --  0.85  --  0.85 0.90  CALCIUM 8.5*  --   --  8.6*  --  8.6*  --  8.7* 8.2*  MG 1.6* 1.5* 1.9  --  1.7 1.6* 1.9  --   --   PHOS 3.2 1.9* 1.8*  --  1.6* 1.7* 2.3*  --   --    GFR: Estimated Creatinine Clearance: 91.4 mL/min (by C-G formula based on SCr of 0.9 mg/dL). Recent Labs  Lab 04/05/18 0553 04/05/18 1818 04/06/18 0500 04/07/18 0556  WBC 6.5 7.3 9.4 13.4*    Liver Function Tests: No results for input(s): AST, ALT, ALKPHOS, BILITOT, PROT, ALBUMIN in the last 168 hours. No results for input(s): LIPASE, AMYLASE in the last 168 hours. No results for input(s): AMMONIA in the last 168 hours.  ABG    Component Value Date/Time   PHART 7.318 (L) 04/02/2018 0413   PCO2ART 37.9 04/02/2018 0413   PO2ART 166 (H) 04/02/2018 0413   HCO3 18.9 (L) 04/02/2018 0413   TCO2 20 (L) 03/28/2018 1320   ACIDBASEDEF 6.1 (H) 04/02/2018 0413   O2SAT 98.6 04/02/2018 0413     Coagulation Profile: Recent Labs  Lab 03/29/2018 0653 03/07/2018 1608  INR 1.03 1.24    Cardiac Enzymes: Recent Labs  Lab 04/02/18 1236  CKTOTAL 103    HbA1C: Hgb A1c MFr Bld  Date/Time Value Ref Range Status  03/11/2018 06:22 AM 4.7 (L) 4.8 - 5.6 % Final    Comment:    (NOTE) Pre diabetes:          5.7%-6.4% Diabetes:              >6.4% Glycemic control for   <7.0% adults with diabetes     CBG: Recent Labs  Lab  04/06/18 1520 04/06/18 2025 04/06/18 2306  04/07/18 0354 04/07/18 0741  GLUCAP 102* 103* 125* 97 101*   This patient is critically ill with multiple organ system failure; which, requires frequent high complexity decision making, assessment, support, evaluation, and titration of therapies. This was completed through the application of advanced monitoring technologies and extensive interpretation of multiple databases. During this encounter critical care time was devoted to patient care services described in this note for 37 minutes.   Garner Nash, DO  Pulmonary Critical Care 04/07/2018 9:48 AM  Personal pager: [phone] If unanswered, please page CCM On-call: [phone]

## 2018-04-07 NOTE — Progress Notes (Signed)
STROKE TEAM PROGRESS NOTE   SUBJECTIVE (INTERVAL HISTORY). Husband is not at bedside today. Yesterday discussed with husband,  he is not sure how he wants to proceed at this time. Discussed repeat CT head Monday morning. Offered palliative care and he is currently not ready yet. Continuing ASA and Brillinta, SBP goal > 110, she remains intubated and sedated now on Fentanyl and precedex. No improvement in neuro exam today, stable, but is sedated.  Appreciate ICU team.    OBJECTIVE Temp:  [99 F (37.2 C)-101.6 F (38.7 C)] 100.3 F (37.9 C) (02/02 0400) Pulse Rate:  [93-139] 98 (02/02 0721) Cardiac Rhythm: (P) Normal sinus rhythm (02/02 0800) Resp:  [10-28] 24 (02/02 0721) BP: (83-141)/(46-91) 116/76 (02/02 0721) SpO2:  [88 %-100 %] 96 % (02/02 0721) FiO2 (%):  [30 %-60 %] 60 % (02/02 0721) Weight:  [119.6 kg] 119.6 kg (02/02 0500)  Recent Labs  Lab 04/06/18 1520 04/06/18 2025 04/06/18 2306 04/07/18 0354 04/07/18 0741  GLUCAP 102* 103* 125* 97 101*   Recent Labs  Lab 04/03/18 0724 04/03/18 1637 04/04/18 0432 04/04/18 1045 04/04/18 1748 04/05/18 0553 04/05/18 1818 04/06/18 0500 04/07/18 0556  NA 141  --   --  139  --  140  --  138 141  K 4.2  --   --  3.3*  --  3.1*  --  3.5 3.6  CL 118*  --   --  117*  --  113*  --  111 116*  CO2 19*  --   --  17*  --  22  --  19* 19*  GLUCOSE 138*  --   --  156*  --  145*  --  127* 116*  BUN 32*  --   --  28*  --  25*  --  24* 24*  CREATININE 1.78*  --   --  0.97  --  0.85  --  0.85 0.90  CALCIUM 8.5*  --   --  8.6*  --  8.6*  --  8.7* 8.2*  MG 1.6* 1.5* 1.9  --  1.7 1.6* 1.9  --   --   PHOS 3.2 1.9* 1.8*  --  1.6* 1.7* 2.3*  --   --    No results for input(s): AST, ALT, ALKPHOS, BILITOT, PROT, ALBUMIN in the last 168 hours. Recent Labs  Lab 03/18/2018 0653  04/02/18 0449  04/04/18 0432 04/05/18 0553 04/05/18 0954 04/05/18 1818 04/06/18 0500 04/07/18 0556  WBC 7.3   < > 15.6*   < > 7.5 6.5  --  7.3 9.4 13.4*  NEUTROABS 4.9   --  13.1*  --   --   --   --   --   --   --   HGB 12.8   < > 11.6*   < > 8.4* 7.0* 6.8* 8.0* 8.0* 7.6*  HCT 42.9   < > 34.7*   < > 26.5* 21.8* 20.5* 24.5* 24.1* 23.9*  MCV 91.7   < > 87.0   < > 89.5 87.9  --  86.9 88.3 89.8  PLT 212   < > 152   < > 80* 89*  --  94* 105* 121*   < > = values in this interval not displayed.   Recent Labs  Lab 04/02/18 1236  CKTOTAL 103   No results for input(s): LABPROT, INR in the last 72 hours. Recent Labs    04/05/18 1421  COLORURINE YELLOW  LABSPEC 1.017  PHURINE 6.0  GLUCOSEU NEGATIVE  HGBUR NEGATIVE  BILIRUBINUR NEGATIVE  KETONESUR NEGATIVE  PROTEINUR 30*  NITRITE NEGATIVE  LEUKOCYTESUR NEGATIVE       Component Value Date/Time   CHOL 175 03/11/2018 0624   TRIG 155 (H) 03/11/2018 0624   HDL 48 03/11/2018 0624   CHOLHDL 3.6 03/11/2018 0624   VLDL 31 03/11/2018 0624   LDLCALC 96 03/11/2018 0624   Lab Results  Component Value Date   HGBA1C 4.7 (L) 03/11/2018      Component Value Date/Time   LABOPIA NONE DETECTED 03/10/2018 1210   COCAINSCRNUR NONE DETECTED 03/10/2018 1210   LABBENZ POSITIVE (A) 03/10/2018 1210   AMPHETMU NONE DETECTED 03/10/2018 1210   THCU NONE DETECTED 03/10/2018 1210   LABBARB NONE DETECTED 03/10/2018 1210    No results for input(s): ETH in the last 168 hours.  I have personally reviewed the radiological images below and agree with the radiology interpretations.  Ct Head Wo Contrast  Result Date: 04/02/2018 CLINICAL DATA:  Stroke, follow-up. EXAM: CT HEAD WITHOUT CONTRAST TECHNIQUE: Contiguous axial images were obtained from the base of the skull through the vertex without intravenous contrast. COMPARISON:  MRI brain 03/13/2018. FINDINGS: Brain: Bilateral watershed territory infarcts are confirmed as hypoattenuation involving the cortex, left greater than right. There is no associated hemorrhage. Left ACA stent is stable in position. There is no hemorrhage or new infarct. Ventricles are of normal size. Insert  pass fluid Vascular: ACA stent is in place. No significant atherosclerotic calcifications are present. There is no hyperdense vessel. Skull: Calvarium is intact. No focal lytic or blastic lesions are present. Sinuses/Orbits: The paranasal sinuses and mastoid air cells are clear. The globes and orbits are within normal limits. IMPRESSION: 1. Expected progression of bilateral watershed territory infarcts, left greater than right. 2. No new infarct territory or hemorrhage. 3. ACA stent. Electronically Signed   By: San Morelle M.D.   On: 04/02/2018 09:51   Ct Head Wo Contrast  Result Date: 03/10/2018 CLINICAL DATA:  Acute onset dizziness and difficulty walking today. EXAM: CT HEAD WITHOUT CONTRAST TECHNIQUE: Contiguous axial images were obtained from the base of the skull through the vertex without intravenous contrast. COMPARISON:  None. FINDINGS: Brain: No evidence of acute infarction, hemorrhage, hydrocephalus, extra-axial collection or mass lesion/mass effect. Vascular: No hyperdense vessel or unexpected calcification. Skull: Normal. Negative for fracture or focal lesion. Sinuses/Orbits: Negative. Other: None. IMPRESSION: Negative head CT. Electronically Signed   By: Inge Rise M.D.   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 Acute onset of right arm and leg weakness. EXAM: MRI HEAD WITHOUT CONTRAST MRA HEAD WITHOUT CONTRAST TECHNIQUE: Multiplanar, multiecho pulse sequences of the brain and surrounding structures were obtained without intravenous contrast. Angiographic images of the head were obtained using MRA technique without contrast. COMPARISON:  Angiographic images same day.  MRI 03/12/2018. FINDINGS: MRI HEAD FINDINGS Brain: The brain shows bilateral watershed distribution cortical and subcortical edema and restricted diffusion, affecting both hemispheres but more  extensive on the left than the right. The question is if this represents true bilateral watershed infarction or if it represents a vaso regulatory disturbance related to spasm. No specific vascular territory infarction is seen. No sign of hemorrhage. Chronic small-vessel ischemic changes affect the brainstem and the cerebral hemispheric white matter as seen previously. No hydrocephalus. No extra-axial collection. Vascular: Major vessels at the base of the brain show flow. Skull  and upper cervical spine: Negative Sinuses/Orbits: Clear/normal Other: None MRA HEAD FINDINGS MR angiography read demonstrates the 5 mm anterior communicating artery aneurysm. There does appear to be a spasm appearance of the A1 vessel on the left and both anterior cerebral arteries, more extensively the right than the left. 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 No middle cerebral artery abnormality is seen. Posterior circulation vessels continued to show a normal appearance. IMPRESSION: Bilateral watershed distribution cortical and subcortical edema and restricted diffusion, more extensive on the left than the right. Differential diagnosis is true watershed infarction versus vaso regulatory disturbance secondary to arterial spasm, which is demonstrated in the anterior cerebral vessels by MR angiography. 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 Electronically Signed   By: Nelson Chimes M.D.   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 Acute onset of right arm and leg weakness. EXAM: MRI HEAD WITHOUT CONTRAST MRA HEAD WITHOUT CONTRAST TECHNIQUE: Multiplanar, multiecho pulse sequences of the brain and surrounding structures were obtained without intravenous contrast. Angiographic images of the head were obtained using MRA technique without contrast.  COMPARISON:  Angiographic images same day.  MRI 03/12/2018. FINDINGS: MRI HEAD FINDINGS Brain: The brain shows bilateral watershed distribution cortical and subcortical edema and restricted diffusion, affecting both hemispheres but more extensive on the left than the right. The question is if this represents true bilateral watershed infarction or if it represents a vaso regulatory disturbance related to spasm. No specific vascular territory infarction is seen. No sign of hemorrhage. Chronic small-vessel ischemic changes affect the brainstem and the cerebral hemispheric white matter as seen previously. No hydrocephalus. No extra-axial collection. Vascular: Major vessels at the base of the brain show flow. 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 There does appear to be a spasm appearance of the A1 vessel on the left and both anterior cerebral arteries, more extensively the right than the left. 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 No middle cerebral artery abnormality is seen. Posterior circulation vessels continued to show a normal appearance. IMPRESSION: Bilateral watershed distribution cortical and subcortical edema and restricted diffusion, more extensive on the left than the right. Differential diagnosis is true watershed infarction versus vaso regulatory disturbance secondary to arterial spasm, which is demonstrated in the anterior cerebral vessels by MR angiography. 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 Electronically Signed   By: Nelson Chimes M.D.   On: 03/31/2018 16:02   Mr Brain Wo Contrast  Result Date: 03/11/2018 CLINICAL DATA:  Headache, new, malignancy suspected. Dizziness and weakness for 5 days EXAM: MRI HEAD WITHOUT CONTRAST MRA HEAD WITHOUT CONTRAST TECHNIQUE: Multiplanar, multiecho  pulse sequences of the brain and surrounding structures were obtained without intravenous contrast. Angiographic images of the head were obtained using MRA technique without contrast. COMPARISON:  None. FINDINGS: MRI HEAD FINDINGS Brain: No acute infarction, hemorrhage, hydrocephalus, extra-axial collection or mass lesion. Mild patchy FLAIR hyperintensity in the pons. Even milder periventricular FLAIR hyperintensity. Normal brain volume. Partially empty sella considered incidental in isolation. Vascular: Arterial findings below. Normal dural venous sinus flow voids. Skull and upper cervical spine: No evident marrow lesion Sinuses/Orbits: Negative MRA HEAD FINDINGS Left larger than right ICA in the  setting of right A1 hypoplasia. There is a lobulated superiorly directed anterior communicating artery aneurysm measuring 5 mm in width and 3 mm base to dome. No major branch occlusion or flow limiting stenosis. Negative for vessel beading. IMPRESSION: Brain MRI: 1. No acute finding or specific explanation for headache. 2. Mild signal abnormality in the pons and periventricular white matter that is usually from chronic small vessel ischemia. Intracranial MRA: 1. 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 2. No emergent finding. Electronically Signed   By: Monte Fantasia M.D.   On: 03/11/2018 09:08   Mr Brain W Contrast  Result Date: 03/12/2018 CLINICAL DATA:  Liver cancer. Headache. Rule out metastatic disease. EXAM: MRI HEAD WITH CONTRAST TECHNIQUE: Multiplanar, multiecho pulse sequences of the brain and surrounding structures were obtained with intravenous contrast. CONTRAST:  10 mL Gadovist IV COMPARISON:  Unenhanced MRI head 03/11/2018 FINDINGS: Normal enhancement. No enhancing mass lesion. Leptomeningeal enhancement normal. No acute abnormality. See recent unenhanced MRI report. IMPRESSION: No acute abnormality and negative for intracranial metastatic disease. Electronically Signed   By: Franchot Gallo M.D.    On: 03/12/2018 11:16   US Carotid Bilateral (at [facility] And [facility] Only)  Result Date: 03/11/2018 CLINICAL DATA:  58 year old female with history of vertigo EXAM: BILATERAL CAROTID DUPLEX ULTRASOUND TECHNIQUE: Pearline Cables scale imaging, color Doppler and duplex ultrasound were performed of bilateral carotid and vertebral arteries in the neck. COMPARISON:  None. FINDINGS: Criteria: Quantification of carotid stenosis is based on velocity parameters that correlate the residual internal carotid diameter with NASCET-based stenosis levels, using the diameter of the distal internal carotid lumen as the denominator for stenosis measurement. The following velocity measurements were obtained: RIGHT ICA:  Systolic 91 cm/sec, Diastolic 34 cm/sec CCA:  643 cm/sec SYSTOLIC ICA/CCA RATIO:  0.9 ECA:  96 cm/sec LEFT ICA:  Systolic 78 cm/sec, Diastolic 38 cm/sec CCA:  93 cm/sec SYSTOLIC ICA/CCA RATIO:  0.8 ECA:  77 cm/sec Right Brachial SBP: Not acquired Left Brachial SBP: Not acquired RIGHT CAROTID ARTERY: No significant calcified disease of the right common carotid artery. Intermediate waveform maintained. Heterogeneous plaque without significant calcifications at the right carotid bifurcation. Low resistance waveform of the right ICA. No significant tortuosity. RIGHT VERTEBRAL ARTERY: Antegrade flow with low resistance waveform. LEFT CAROTID ARTERY: No significant calcified disease of the left common carotid artery. Intermediate waveform maintained. Heterogeneous plaque at the left carotid bifurcation without significant calcifications. Low resistance waveform of the left ICA. LEFT VERTEBRAL ARTERY:  Antegrade flow with low resistance waveform. Additional: Right thyroid nodule measures 1.8 cm. IMPRESSION: Color duplex indicates minimal heterogeneous plaque, with no hemodynamically significant stenosis by duplex criteria in the extracranial cerebrovascular circulation. Incidental finding of right thyroid nodule measuring 1.8 cm. Dedicated  thyroid ultrasound recommended if further characterisation warranted. Signed, Dulcy Fanny. Dellia Nims, RPVI [facility] Electronically Signed   By: Corrie Mckusick D.O.   On: 03/11/2018 09:41   Mr Jodene Nam Head/brain PI Cm  Result Date: 03/11/2018 CLINICAL DATA:  Headache, new, malignancy suspected. Dizziness and weakness for 5 days EXAM: MRI HEAD WITHOUT CONTRAST MRA HEAD WITHOUT CONTRAST TECHNIQUE: Multiplanar, multiecho pulse sequences of the brain and surrounding structures were obtained without intravenous contrast. Angiographic images of the head were obtained using MRA technique without contrast. COMPARISON:  None. FINDINGS: MRI HEAD FINDINGS Brain: No acute infarction, hemorrhage, hydrocephalus, extra-axial collection or mass lesion. Mild patchy FLAIR hyperintensity in the pons. Even milder periventricular FLAIR hyperintensity. Normal brain volume. Partially empty sella considered  incidental in isolation. Vascular: Arterial findings below. Normal dural venous sinus flow voids. 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 No major branch occlusion or flow limiting stenosis. Negative for vessel beading. IMPRESSION: Brain MRI: 1. No acute finding or specific explanation for headache. 2. Mild signal abnormality in the pons and periventricular white matter that is usually from chronic small vessel ischemia. Intracranial MRA: 1. 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 2. No emergent finding. Electronically Signed   By: Monte Fantasia M.D.   On: 03/11/2018 09:08   Mr Brain Wo Contrast  Result Date: 04/03/2018 CLINICAL DATA:  Stroke follow-up EXAM: MRI HEAD WITHOUT CONTRAST TECHNIQUE: Multiplanar, multiecho  pulse sequences of the brain and surrounding structures were obtained without intravenous contrast. COMPARISON:  Brain MRI 03/10/2018 and head CT 04/03/2018 FINDINGS: BRAIN: Compared to the prior MRI of 03/23/2018 there has been progression of bilateral watershed distribution infarcts, left-greater-than-right. The amount of ischemia is worst in the left frontal lobe. There is no midline shift or other mass effect. Partially empty sella. There is cytotoxic edema greatest in the superior anterior frontal lobes. Multifocal white matter hyperintensity, most commonly due to chronic ischemic microangiopathy. The cerebral and cerebellar volume are age-appropriate. Susceptibility-sensitive sequences show no chronic microhemorrhage or superficial siderosis. VASCULAR: Major intracranial arterial and venous sinus flow voids are normal. SKULL AND UPPER CERVICAL SPINE: Calvarial bone marrow signal is normal. There is no skull base mass. Visualized upper cervical spine and soft tissues are normal. SINUSES/ORBITS: No fluid levels or advanced mucosal thickening. No mastoid or middle ear effusion. The orbits are normal. IMPRESSION: 1. Worsening bilateral watershed distribution infarcts compared to MRI of 03/06/2018. Compared to the CT from earlier today, the distribution appears unchanged, allowing for differences in modality. 2. No hemorrhage or mass effect. Electronically Signed   By: Ulyses Jarred M.D.   On: 04/03/2018 20:33    PHYSICAL EXAM  Temp:  [99 F (37.2 C)-101.6 F (38.7 C)] 100.3 F (37.9 C) (02/02 0400) Pulse Rate:  [93-139] 98 (02/02 0721) Resp:  [10-28] 24 (02/02 0721) BP: (83-141)/(46-91) 116/76 (02/02 0721) SpO2:  [88 %-100 %] 96 % (02/02 0721) FiO2 (%):  [30 %-60 %] 60 % (02/02 0721) Weight:  [119.6 kg] 119.6 kg (02/02 0500)  General - Well nourished, well developed, intubated on precedex.  Ophthalmologic - fundi not visualized due to noncooperation.  Cardiovascular - Regular rate and  rhythm.  Neuro - intubated on precedex, eyes open with voice, but not following commands. Eyes conjugate right gaze preference, not blinking to visual threat bilaterally, but blinking spontaneously, doll's eyes present, not tracking, PERRL. Oral involuntary movement with chewing. Corneal reflex present bilaterally, gag and cough present. Breathing over the vent.  Facial symmetry not able to test due to ET tube.  Tongue midline in mouth. On pain stimulation, no withdraw in extremities but brisk babinski.  Increased muscle tone right arm and leg> left arm and leg also axial increased tone,. DTR 1+ and brisk positive bilateral babinski. Sensation, coordination and gait not tested.   ASSESSMENT/PLAN Ms. Aracelis Ulrey is a 57 y.o. female with history of CKD, CAD, OSA, liver tumor, cerebral aneurysm admitted for right arm and left leg weakness found after aneurysm coiling. No tPA given due to right groin  severe bleeding.    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  MRI - bilateral ACA and MCA/ACA watershed scattered infarcts  MRA - right ACA stenting  CT 04/02/2018 no bleeding, involving lateral ACA and MCA/ACA infarcts  MRI repeat 04/03/2018 showed extensive bilateral ACA and MCA/ACA infarcts  Carotid Doppler unremarkable  2D Echo EF 60 to 65%  LDL 96  HgbA1c 4.5  Heparin subcu for VTE prophylaxis  aspirin 325 mg daily and clopidogrel 75 mg daily prior to admission, now on aspirin 81 mg daily and Brilinta.  Ongoing aggressive stroke risk factor management  Therapy recommendations: Pending  Disposition: Pending, a long discussion with husband at bedside regarding [place] and DNR.  Husband still needs time to think about it. He declined palliative consult.  CODE  STATUS full code at this time  Vegetative state with increased muscle tone bilaterally  Patient developed diffuse increased muscle tone in all extremities and axially  MRI showed bilateral ACA and MCA/ACA infarcts  MRI and repeat extensive bilateral ACA and MCA/ACA infarcts, progressed from prior MRI  EEG no seizure  Resume home Wellbutrin and Effexor as well as Topamax  Resume home Klonopin 0.5 mg twice daily  Supportive care   Hypotension   BP 110s  Now off neo   BP goal > 110  Fever   Tmax 100.9  CXR - Mild bilateral multifocal infiltrates  UA  - unremarkable   Blood culture -  Strep anginosis  CCM on board  Sputum culture - Gram positive cocci - Gram positive rods - Gram negative rods - few ecoli in trache aspirate  AKI   Creatinine 1.04-1.05-1.78-0.97-0.85-0.9  On IVF   oligouria resolved  CCM on board  Close monitoring  ACOM aneurysm  Status post ACOM stent placement  On aspirin and Brilinta  Heparin IV discontinued  Dr.Deveshwar following  Right groin hematoma s/p right deep femoral A repair  Status post cerebral angiogram  VVS on board  P drainage off - on wound vac  Close vascular check  Continue aspirin and Brilinta  Severe anemia due to blood loss  Hemoglobin down from 12.8->6.1, received 4 units PRBC  hemoglobin 7.6 04/07/2018 - PRBC  Stool occult blood Negative   Consider CT abdomen to rule out peritoneal hemorrhage if needed. Appreciate CCM following.   Hyperlipidemia  Home meds:  Zocor 20   LDL 96, goal < 70  Now on Zocor 40  Continue statin at discharge  Other Stroke Risk Factors  Morbid Obesity, Body mass index is 43.88 kg/m.   Obstructive sleep apnea, on CPAP at home  Other Active Problems  Leukocytosis 16.1-15.6-19.0-7.5-6.5 -> 7.3 -> 9.4  Hospital day # 6  This patient is critically ill due to bilateral ACA, MCA/ACA territory infarcts, severe anemia requiring blood transfusion, groin hematoma  with femoral artery repair, hypotension, leukocytosis and at significant risk of neurological worsening, death form recurrent stroke, hemorrhagic conversion, seizure, hypovolemic shock, sepsis.   This patient is critically ill and at significant risk of neurological worsening, death and care requires constant monitoring of vital signs, hemodynamics,respiratory and cardiac monitoring,review of multiple databases, neurological assessment, discussion with family, other specialists and medical decision making of high complexity.I  I spent 30 minutes of neurocritical care time in the care of this patient.  To contact Stroke Continuity provider, please refer to [email or web address] After hours, contact General Neurology

## 2018-04-07 NOTE — Progress Notes (Signed)
PHARMACIST - PHYSICIAN  COMMUNICATION  CONCERNING: IV to Oral Route Change Policy  RECOMMENDATION: This patient is receiving Protonix by the intravenous route.  Based on criteria approved by the Pharmacy and Therapeutics Committee, the intravenous medication(s) is/are being converted to the equivalent oral dose form(s).   DESCRIPTION: These criteria include:  The patient is eating (either orally or via tube) and/or has been taking other orally administered medications for a least 24 hours  The patient has no evidence of active gastrointestinal bleeding or impaired GI absorption (gastrectomy, short bowel, patient on TNA or NPO).  If you have questions about this conversion, please contact the Pharmacy Department  []   [phone] )  [facility] []   [phone] )  [facility] [x]   [phone] )  [facility] []   [phone] )  [facility] []   [phone] )  [place], [place], [facility] 04/07/2018 6:12 AM

## 2018-04-08 ENCOUNTER — Inpatient Hospital Stay (HOSPITAL_COMMUNITY): Payer: Medicare Other

## 2018-04-08 LAB — GLUCOSE, CAPILLARY
GLUCOSE-CAPILLARY: 101 mg/dL — AB (ref 70–99)
Glucose-Capillary: 116 mg/dL — ABNORMAL HIGH (ref 70–99)
Glucose-Capillary: 123 mg/dL — ABNORMAL HIGH (ref 70–99)
Glucose-Capillary: 111 mg/dL — ABNORMAL HIGH (ref 70–99)
Glucose-Capillary: 110 mg/dL — ABNORMAL HIGH (ref 70–99)
Glucose-Capillary: 116 mg/dL — ABNORMAL HIGH (ref 70–99)

## 2018-04-08 LAB — CBC WITH DIFFERENTIAL/PLATELET
BAND NEUTROPHILS: 0 %
Basophils Absolute: 0 10*3/uL (ref 0.0–0.1)
Basophils Relative: 0 %
Blasts: 0 %
EOS ABS: 0.2 10*3/uL (ref 0.0–0.5)
Eosinophils Relative: 1 %
HCT: 23.8 % — ABNORMAL LOW (ref 36.0–46.0)
Hemoglobin: 7.5 g/dL — ABNORMAL LOW (ref 12.0–15.0)
Lymphocytes Relative: 5 %
Lymphs Abs: 1 10*3/uL (ref 0.7–4.0)
MCH: 28.7 pg (ref 26.0–34.0)
MCHC: 31.5 g/dL (ref 30.0–36.0)
MCV: 91.2 fL (ref 80.0–100.0)
METAMYELOCYTES PCT: 0 %
MYELOCYTES: 0 %
Monocytes Absolute: 0.8 10*3/uL (ref 0.1–1.0)
Monocytes Relative: 4 %
Neutro Abs: 17.3 10*3/uL — ABNORMAL HIGH (ref 1.7–7.7)
Neutrophils Relative %: 90 %
Other: 0 %
Platelets: 165 10*3/uL (ref 150–400)
Promyelocytes Relative: 0 %
RBC: 2.61 MIL/uL — ABNORMAL LOW (ref 3.87–5.11)
RDW: 16 % — ABNORMAL HIGH (ref 11.5–15.5)
WBC: 19.3 10*3/uL — ABNORMAL HIGH (ref 4.0–10.5)
nRBC: 0 /100 WBC
nRBC: 0.7 % — ABNORMAL HIGH (ref 0.0–0.2)

## 2018-04-08 LAB — CULTURE, BLOOD (ROUTINE X 2): Special Requests: ADEQUATE

## 2018-04-08 LAB — CULTURE, RESPIRATORY W GRAM STAIN

## 2018-04-08 LAB — PLATELET INHIBITION P2Y12: Platelet Function  P2Y12: 126 [PRU] — ABNORMAL LOW (ref 194–418)

## 2018-04-08 LAB — POCT I-STAT 7, (LYTES, BLD GAS, ICA,H+H)
Acid-base deficit: 5 mmol/L — ABNORMAL HIGH (ref 0.0–2.0)
Bicarbonate: 19.4 mmol/L — ABNORMAL LOW (ref 20.0–28.0)
Calcium, Ion: 1.36 mmol/L (ref 1.15–1.40)
HCT: 21 % — ABNORMAL LOW (ref 36.0–46.0)
Hemoglobin: 7.1 g/dL — ABNORMAL LOW (ref 12.0–15.0)
O2 Saturation: 98 %
PH ART: 7.356 (ref 7.350–7.450)
Patient temperature: 99.4
Potassium: 4.1 mmol/L (ref 3.5–5.1)
Sodium: 142 mmol/L (ref 135–145)
TCO2: 20 mmol/L — ABNORMAL LOW (ref 22–32)
pCO2 arterial: 34.8 mmHg (ref 32.0–48.0)
pO2, Arterial: 115 mmHg — ABNORMAL HIGH (ref 83.0–108.0)

## 2018-04-08 MED ORDER — FUROSEMIDE 10 MG/ML IJ SOLN
40.0000 mg | Freq: Once | INTRAMUSCULAR | Status: AC
  Administered 2018-04-08: 40 mg via INTRAVENOUS
  Filled 2018-04-08: qty 4

## 2018-04-08 MED ORDER — POTASSIUM CHLORIDE 20 MEQ/15ML (10%) PO SOLN
40.0000 meq | Freq: Once | ORAL | Status: AC
  Administered 2018-04-08: 40 meq
  Filled 2018-04-08: qty 30

## 2018-04-08 NOTE — Progress Notes (Signed)
Pt agitated and biting at tube. Bed CPT held at this time.

## 2018-04-08 NOTE — Progress Notes (Addendum)
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 Estanislado Pandy. Patient laying in bed intubated and sedation. No family at bedside. She opens eyes to voice but does not follow commands. She demonstrates no change to neurologic function- still with increased muscle tone in all extremities and neck but does have spontaneous movements of lower extremities (can flex knees, R>L) and is biting at her ET tube. Right groin incision c/d/iwith wound vac intact.  CT head 04/08/2018: 1. 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   Allergies: Patient has no known allergies.  Medications: Prior to Admission medications   Medication Sig Start Date End Date Taking? Authorizing Provider  aspirin 325 MG EC tablet Take 325 mg by mouth daily. " And as needed if headache continues"   Yes [provider]  B-COMPLEX-C PO Take 1 tablet by mouth daily.   Yes [provider]  buPROPion (WELLBUTRIN SR) 150 MG 12 hr tablet Take 150 mg by mouth 2 (two) times daily.   Yes [provider]  celecoxib (CELEBREX) 200 MG capsule Take 200 mg by mouth daily. 5pm   Yes [provider]  clonazePAM (KLONOPIN) 0.5 MG tablet Take 0.5 mg by mouth 2 (two) times daily. 1 tablet in the morning and take 1 tablet at 5p. May take additional half-whole tablet throughout the day as needed for anxiety   Yes [provider]  clopidogrel (PLAVIX) 75 MG tablet Take 75 mg by mouth daily.   Yes [provider]  metoprolol succinate (TOPROL-XL) 25 MG 24 hr tablet Take 12.5 mg by mouth every morning.   Yes [provider]  pantoprazole (PROTONIX) 40 MG tablet Take 40 mg by mouth every morning.   Yes [provider]    polycarbophil (FIBERCON) 625 MG tablet Take 625 mg by mouth 2 (two) times daily.   Yes [provider]  simvastatin (ZOCOR) 20 MG tablet Take 20 mg by mouth every morning.   Yes [provider]  topiramate (TOPAMAX) 25 MG tablet Take 25 mg by mouth daily. 25mg  at night for one week then 50 mg at night thereafter   Yes [provider]  venlafaxine XR (EFFEXOR-XR) 150 MG 24 hr capsule Take 150 mg by mouth 2 (two) times daily.   Yes [provider]     Vital Signs: BP 119/78   Pulse 80   Temp 98.8 F (37.1 C) (Oral)   Resp 20   Ht 5\' 5"  (1.651 m)   Wt 262 lb 5.6 oz (119 kg)   SpO2 95%   BMI 43.66 kg/m   Physical Exam Vitals signs and nursing note reviewed.  Constitutional:      General: She is not in acute distress.    Appearance: Normal appearance.     Comments: Intubated and sedated.  Pulmonary:     Effort: Pulmonary effort is normal. No respiratory distress.     Comments: Intubated and sedated. Skin:    General: Skin is warm and dry.     Comments: Right groin incision soft with surrounding ecchymosis mostly lateral to incision, wound vac intact, no active bleeding or hematoma.  Neurological:     Comments: Intubated and sedation. She opens eyes to voice but does not follow commands. PERRL bilaterally. EOMs not assessed. Visual fields not assessed. Unable to  assess facial asymmetry due to ET tube. Unable to assess tongue protrusion due to ET tube. Demonstrates increased muscle tone in all extremities and neck but does have spontaneous movements of lower extremities (can flex knees, R>L) and is biting at her ET tube. Pronator drift not assessed. Fine motor and coordination not assessed. Gait not assessed. Romberg not assessed. Heel to toe not assessed. Distal pulses palpable bilaterally with Doppler.  Psychiatric:     Comments: Intubated and sedated.     Imaging: Ct Head Wo Contrast  Result Date: 04/08/2018 CLINICAL DATA:  Follow  up stroke. Status post stenting of A-comm aneurysm. EXAM: CT HEAD WITHOUT CONTRAST TECHNIQUE: Contiguous axial images were obtained from the base of the skull through the vertex without intravenous contrast. COMPARISON:  MRI of the head April 03, 2018 FINDINGS: BRAIN: Evolving acute bilateral frontoparietal infarcts with petechial hemorrhage. Mild regional mass effect without midline shift. No lobar hematoma. No parenchymal brain volume loss for age. No hydrocephalus. No abnormal extra-axial fluid collections. Basal cisterns are patent. VASCULAR: ACA stent. SKULL/SOFT TISSUES: No skull fracture. No significant soft tissue swelling. ORBITS/SINUSES: The included ocular globes and orbital contents are normal.Mild paranasal sinus mucosal thickening. Mastoid air cells are well aerated. OTHER: Life support lines in place. IMPRESSION: 1. 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 Electronically Signed   By: Elon Alas M.D.   On: 04/08/2018 05:27   Dg Chest Port 1 View  Result Date: 04/08/2018 CLINICAL DATA:  Respiratory distress. Shortness of breath. EXAM: PORTABLE CHEST 1 VIEW COMPARISON:  04/06/2018 FINDINGS: Endotracheal tube tip measures 3.5 cm above the carina. Enteric tube tip is off the field of view but below the left hemidiaphragm. A right PICC line is in place with tip over the low SVC region. No pneumothorax. Shallow inspiration. Heart size is normal for technique. Diffuse bilateral airspace infiltrates throughout the lungs bilaterally, progressing since previous study. This may represent edema, pneumonia, or ARDS. No blunting of costophrenic angles. IMPRESSION: Appliances appear in satisfactory location. Increasing diffuse bilateral pulmonary infiltrates. Electronically Signed   By: Lucienne Capers M.D.   On: 04/08/2018 00:12   Dg Chest Portable 1 View  Result Date: 04/06/2018 CLINICAL DATA:  Endotracheal tube position. EXAM:  PORTABLE CHEST 1 VIEW COMPARISON:  Radiograph of April 06, 2018. FINDINGS: Stable cardiomegaly. Endotracheal and nasogastric tubes are unchanged in position. Right subclavian catheter is unchanged in position. No pneumothorax or pleural effusion is noted. Stable right upper lobe and left perihilar opacities are noted concerning for pneumonia or possibly edema. Bony thorax is unremarkable. IMPRESSION: Stable bilateral lung opacities are noted concerning for pneumonia or edema. Stable support apparatus. Electronically Signed   By: Marijo Conception, M.D.   On: 04/06/2018 08:16   Dg Chest Port 1 View  Result Date: 04/06/2018 CLINICAL DATA:  Respiratory failure. EXAM: PORTABLE CHEST 1 VIEW COMPARISON:  Radiograph of April 05, 2018. FINDINGS: Endotracheal and nasogastric tubes are unchanged in position. Right subclavian catheter is unchanged in position. Stable cardiomediastinal silhouette is noted. Increased bilateral perihilar and upper lobe opacities are noted concerning for edema or pneumonia. Bony thorax is unremarkable. IMPRESSION: Stable support apparatus. Increased bilateral perihilar and upper lobe opacities are noted concerning for edema or pneumonia. Electronically Signed   By: Marijo Conception, M.D.   On: 04/06/2018 08:12   Dg Chest Port 1 View  Result Date: 04/05/2018 CLINICAL DATA:  Respiratory failure. EXAM: PORTABLE CHEST 1 VIEW COMPARISON:  04/04/2018. FINDINGS: Endotracheal tube,  NG tube, right PICC line stable position. Heart size normal. Mild bilateral multifocal infiltrates/edema are noted. Heart size normal. No pleural effusion or pneumothorax. IMPRESSION: 1.  Lines and tubes in stable position. 2. Mild bilateral multifocal infiltrates/edema noted on today's exam. Aspiration can not be excluded. Electronically Signed   By: Marcello Moores  Register   On: 04/05/2018 06:44    Labs:  CBC: Recent Labs    04/05/18 1818 04/06/18 0500 04/07/18 0556 04/08/18 0103 04/08/18 0555  WBC 7.3 9.4 13.4*   --  19.3*  HGB 8.0* 8.0* 7.6* 7.1* 7.5*  HCT 24.5* 24.1* 23.9* 21.0* 23.8*  PLT 94* 105* 121*  --  165    COAGS: Recent Labs    03/10/18 1058 03/07/2018 0653 03/14/2018 1608  INR 0.97 1.03 1.24  APTT 25  --  26    BMP: Recent Labs    04/04/18 1045 04/05/18 0553 04/06/18 0500 04/07/18 0556 04/08/18 0103  NA 139 140 138 141 142  K 3.3* 3.1* 3.5 3.6 4.1  CL 117* 113* 111 116*  --   CO2 17* 22 19* 19*  --   GLUCOSE 156* 145* 127* 116*  --   BUN 28* 25* 24* 24*  --   CALCIUM 8.6* 8.6* 8.7* 8.2*  --   CREATININE 0.97 0.85 0.85 0.90  --   GFRNONAA >60 >60 >60 >60  --   GFRAA >60 >60 >60 >60  --     LIVER FUNCTION TESTS: Recent Labs    03/10/18 1058 03/11/18 2024  BILITOT 0.5 0.3  AST 18 16  ALT 17 14  ALKPHOS 101 80  PROT 6.9 5.7*  ALBUMIN 3.8 3.2*    Assessment and Plan:  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 Estanislado Pandy. Patient's condition stable- remains intubated and sedation, opens eyes to voicebut does not follow commands, demonstrates increased muscle tone in all extremities and neck but does have spontaneous movements of lower extremities (can flex knees, R>L) and is biting at her ET tube. Right groin incision stable- plans per vascular surgery. GOC/DNR meeting planned for this week. Continue taking Brilinta 90 mg twice daily and Aspirin 81 mg once daily. Will obtain P2Y12 today to check effectiveness of Brilinta use. Appreciate and agree with neurology,CCM, and vascular surgerymanagement. IR to follow.   Electronically Signed: Earley Abide, PA-C 04/08/2018, 8:00 AM   I spent a total of 25 Minutes at the the patient's bedside AND on the patient's hospital floor or unit, greater than 50% of which was counseling/coordinating care for [facility] aneurysm s/p embolization.

## 2018-04-08 NOTE — Progress Notes (Signed)
STROKE TEAM PROGRESS NOTE   SUBJECTIVE (INTERVAL HISTORY). Husband is not at bedside today. Dr Jaynee Eagles discussed with husband over weekend poor prognosis,  he is not sure how he wants to proceed at this time. Repeat CT head this morning shows evolving bifrontal parietal watershed territory infarct with petechial hemorrhage and no significant change. Husband was offered palliative care and he is currently not ready yet.   she remains intubated and sedated now on Fentanyl and precedex and Neo-Synephrine. No improvement in neuro exam today, stable, but is sedated.  A   OBJECTIVE Temp:  [98.7 F (37.1 C)-102 F (38.9 C)] 101 F (38.3 C) (02/03 1200) Pulse Rate:  [80-124] 117 (02/03 1300) Cardiac Rhythm: Sinus tachycardia (02/03 1200) Resp:  [10-30] 20 (02/03 1300) BP: (107-149)/(69-116) 132/78 (02/03 1300) SpO2:  [90 %-100 %] 100 % (02/03 1300) FiO2 (%):  [40 %-80 %] 40 % (02/03 1200) Weight:  [809 kg] 119 kg (02/03 0500)  Recent Labs  Lab 04/07/18 1910 04/07/18 2301 04/08/18 0304 04/08/18 0734 04/08/18 1149  GLUCAP 99 106* 116* 123* 101*   Recent Labs  Lab 04/03/18 0724 04/03/18 1637 04/04/18 0432 04/04/18 1045 04/04/18 1748 04/05/18 0553 04/05/18 1818 04/06/18 0500 04/07/18 0556 04/08/18 0103  NA 141  --   --  139  --  140  --  138 141 142  K 4.2  --   --  3.3*  --  3.1*  --  3.5 3.6 4.1  CL 118*  --   --  117*  --  113*  --  111 116*  --   CO2 19*  --   --  17*  --  22  --  19* 19*  --   GLUCOSE 138*  --   --  156*  --  145*  --  127* 116*  --   BUN 32*  --   --  28*  --  25*  --  24* 24*  --   CREATININE 1.78*  --   --  0.97  --  0.85  --  0.85 0.90  --   CALCIUM 8.5*  --   --  8.6*  --  8.6*  --  8.7* 8.2*  --   MG 1.6* 1.5* 1.9  --  1.7 1.6* 1.9  --   --   --   PHOS 3.2 1.9* 1.8*  --  1.6* 1.7* 2.3*  --   --   --    No results for input(s): AST, ALT, ALKPHOS, BILITOT, PROT, ALBUMIN in the last 168 hours. Recent Labs  Lab 04/02/18 0449  04/05/18 0553   04/05/18 1818 04/06/18 0500 04/07/18 0556 04/08/18 0103 04/08/18 0555  WBC 15.6*   < > 6.5  --  7.3 9.4 13.4*  --  19.3*  NEUTROABS 13.1*  --   --   --   --   --   --   --  17.3*  HGB 11.6*   < > 7.0*   < > 8.0* 8.0* 7.6* 7.1* 7.5*  HCT 34.7*   < > 21.8*   < > 24.5* 24.1* 23.9* 21.0* 23.8*  MCV 87.0   < > 87.9  --  86.9 88.3 89.8  --  91.2  PLT 152   < > 89*  --  94* 105* 121*  --  165   < > = values in this interval not displayed.   Recent Labs  Lab 04/02/18 1236  CKTOTAL 103   No results  for input(s): LABPROT, INR in the last 72 hours. No results for input(s): COLORURINE, LABSPEC, [place], GLUCOSEU, HGBUR, BILIRUBINUR, KETONESUR, PROTEINUR, UROBILINOGEN, NITRITE, LEUKOCYTESUR in the last 72 hours.  Invalid input(s): APPERANCEUR     Component Value Date/Time   CHOL 175 03/11/2018 0624   TRIG 155 (H) 03/11/2018 0624   HDL 48 03/11/2018 0624   CHOLHDL 3.6 03/11/2018 0624   VLDL 31 03/11/2018 0624   LDLCALC 96 03/11/2018 0624   Lab Results  Component Value Date   HGBA1C 4.7 (L) 03/11/2018      Component Value Date/Time   LABOPIA NONE DETECTED 03/10/2018 1210   COCAINSCRNUR NONE DETECTED 03/10/2018 1210   LABBENZ POSITIVE (A) 03/10/2018 1210   AMPHETMU NONE DETECTED 03/10/2018 1210   THCU NONE DETECTED 03/10/2018 1210   LABBARB NONE DETECTED 03/10/2018 1210    No results for input(s): ETH in the last 168 hours.  I have personally reviewed the radiological images below and agree with the radiology interpretations.  Ct Head Wo Contrast  Result Date: 04/02/2018 CLINICAL DATA:  Stroke, follow-up. EXAM: CT HEAD WITHOUT CONTRAST TECHNIQUE: Contiguous axial images were obtained from the base of the skull through the vertex without intravenous contrast. COMPARISON:  MRI brain 04/03/2018. FINDINGS: Brain: Bilateral watershed territory infarcts are confirmed as hypoattenuation involving the cortex, left greater than right. There is no associated hemorrhage. Left ACA stent is  stable in position. There is no hemorrhage or new infarct. Ventricles are of normal size. Insert pass fluid Vascular: ACA stent is in place. No significant atherosclerotic calcifications are present. There is no hyperdense vessel. Skull: Calvarium is intact. No focal lytic or blastic lesions are present. Sinuses/Orbits: The paranasal sinuses and mastoid air cells are clear. The globes and orbits are within normal limits. IMPRESSION: 1. Expected progression of bilateral watershed territory infarcts, left greater than right. 2. No new infarct territory or hemorrhage. 3. ACA stent. Electronically Signed   By: San Morelle M.D.   On: 04/02/2018 09:51   Ct Head Wo Contrast  Result Date: 03/10/2018 CLINICAL DATA:  Acute onset dizziness and difficulty walking today. EXAM: CT HEAD WITHOUT CONTRAST TECHNIQUE: Contiguous axial images were obtained from the base of the skull through the vertex without intravenous contrast. COMPARISON:  None. FINDINGS: Brain: No evidence of acute infarction, hemorrhage, hydrocephalus, extra-axial collection or mass lesion/mass effect. Vascular: No hyperdense vessel or unexpected calcification. Skull: Normal. Negative for fracture or focal lesion. Sinuses/Orbits: Negative. Other: None. IMPRESSION: Negative head CT. Electronically Signed   By: Inge Rise M.D.   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 Acute onset of right arm and leg weakness. EXAM: MRI HEAD WITHOUT CONTRAST MRA HEAD WITHOUT CONTRAST TECHNIQUE: Multiplanar, multiecho pulse sequences of the brain and surrounding structures were obtained without intravenous contrast. Angiographic images of the head were obtained using MRA technique without contrast. COMPARISON:  Angiographic images same day.  MRI 03/12/2018. FINDINGS: MRI HEAD FINDINGS Brain: The brain shows bilateral watershed  distribution cortical and subcortical edema and restricted diffusion, affecting both hemispheres but more extensive on the left than the right. The question is if this represents true bilateral watershed infarction or if it represents a vaso regulatory disturbance related to spasm. No specific vascular territory infarction is seen. No sign of hemorrhage. Chronic small-vessel ischemic changes affect the brainstem and the cerebral hemispheric white matter as seen previously. No hydrocephalus. No extra-axial collection. Vascular:  Major vessels at the base of the brain show flow. 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 There does appear to be a spasm appearance of the A1 vessel on the left and both anterior cerebral arteries, more extensively the right than the left. 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 No middle cerebral artery abnormality is seen. Posterior circulation vessels continued to show a normal appearance. IMPRESSION: Bilateral watershed distribution cortical and subcortical edema and restricted diffusion, more extensive on the left than the right. Differential diagnosis is true watershed infarction versus vaso regulatory disturbance secondary to arterial spasm, which is demonstrated in the anterior cerebral vessels by MR angiography. 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 Electronically Signed   By: Nelson Chimes M.D.   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 Acute onset of right arm and leg weakness. EXAM: MRI HEAD WITHOUT CONTRAST MRA HEAD WITHOUT CONTRAST TECHNIQUE: Multiplanar, multiecho pulse sequences of the brain and surrounding structures were obtained without  intravenous contrast. Angiographic images of the head were obtained using MRA technique without contrast. COMPARISON:  Angiographic images same day.  MRI 03/12/2018. FINDINGS: MRI HEAD FINDINGS Brain: The brain shows bilateral watershed distribution cortical and subcortical edema and restricted diffusion, affecting both hemispheres but more extensive on the left than the right. The question is if this represents true bilateral watershed infarction or if it represents a vaso regulatory disturbance related to spasm. No specific vascular territory infarction is seen. No sign of hemorrhage. Chronic small-vessel ischemic changes affect the brainstem and the cerebral hemispheric white matter as seen previously. No hydrocephalus. No extra-axial collection. Vascular: Major vessels at the base of the brain show flow. 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 There does appear to be a spasm appearance of the A1 vessel on the left and both anterior cerebral arteries, more extensively the right than the left. 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 No middle cerebral artery abnormality is seen. Posterior circulation vessels continued to show a normal appearance. IMPRESSION: Bilateral watershed distribution cortical and subcortical edema and restricted diffusion, more extensive on the left than the right. Differential diagnosis is true watershed infarction versus vaso regulatory disturbance secondary to arterial spasm, which is demonstrated in the anterior cerebral vessels by MR angiography. 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 Electronically Signed   By: Nelson Chimes M.D.   On: 03/11/2018 16:02   Mr Brain Wo Contrast  Result Date: 03/11/2018 CLINICAL DATA:  Headache, new, malignancy suspected. Dizziness and  weakness for 5 days EXAM: MRI HEAD WITHOUT CONTRAST MRA HEAD WITHOUT CONTRAST TECHNIQUE: Multiplanar, multiecho pulse sequences of the brain and surrounding structures were obtained without intravenous contrast. Angiographic images of the head were obtained using MRA technique without contrast. COMPARISON:  None. FINDINGS: MRI HEAD FINDINGS Brain: No acute infarction, hemorrhage, hydrocephalus, extra-axial collection or mass lesion. Mild patchy FLAIR hyperintensity in the pons. Even milder periventricular FLAIR hyperintensity. Normal brain volume. Partially empty sella considered incidental in isolation. Vascular: Arterial findings below. Normal dural venous sinus flow voids. 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 There is a lobulated superiorly directed anterior communicating artery aneurysm measuring 5 mm in width and 3 mm base to dome. No major branch occlusion or flow limiting stenosis. Negative for vessel beading. IMPRESSION: Brain MRI: 1. No acute finding or specific explanation for headache. 2. Mild signal abnormality in the pons and periventricular white matter that is usually from chronic small vessel ischemia. Intracranial MRA: 1. 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 2. No emergent finding. Electronically Signed   By: Monte Fantasia M.D.   On: 03/11/2018 09:08   Mr Brain W Contrast  Result Date: 03/12/2018 CLINICAL DATA:  Liver cancer. Headache. Rule out metastatic disease. EXAM: MRI HEAD WITH CONTRAST TECHNIQUE: Multiplanar, multiecho pulse sequences of the brain and surrounding structures were obtained with intravenous contrast. CONTRAST:  10 mL Gadovist IV COMPARISON:  Unenhanced MRI head 03/11/2018 FINDINGS: Normal enhancement. No enhancing mass lesion. Leptomeningeal enhancement normal. No acute abnormality. See recent unenhanced MRI report. IMPRESSION: No  acute abnormality and negative for intracranial metastatic disease. Electronically Signed   By: Franchot Gallo M.D.   On: 03/12/2018 11:16   US Carotid Bilateral (at [facility] Only)  Result Date: 03/11/2018 CLINICAL DATA:  58 year old female with history of vertigo EXAM: BILATERAL CAROTID DUPLEX ULTRASOUND TECHNIQUE: Pearline Cables scale imaging, color Doppler and duplex ultrasound were performed of bilateral carotid and vertebral arteries in the neck. COMPARISON:  None. FINDINGS: Criteria: Quantification of carotid stenosis is based on velocity parameters that correlate the residual internal carotid diameter with NASCET-based stenosis levels, using the diameter of the distal internal carotid lumen as the denominator for stenosis measurement. The following velocity measurements were obtained: RIGHT ICA:  Systolic 91 cm/sec, Diastolic 34 cm/sec CCA:  409 cm/sec SYSTOLIC ICA/CCA RATIO:  0.9 ECA:  96 cm/sec LEFT ICA:  Systolic 78 cm/sec, Diastolic 38 cm/sec CCA:  93 cm/sec SYSTOLIC ICA/CCA RATIO:  0.8 ECA:  77 cm/sec Right Brachial SBP: Not acquired Left Brachial SBP: Not acquired RIGHT CAROTID ARTERY: No significant calcified disease of the right common carotid artery. Intermediate waveform maintained. Heterogeneous plaque without significant calcifications at the right carotid bifurcation. Low resistance waveform of the right ICA. No significant tortuosity. RIGHT VERTEBRAL ARTERY: Antegrade flow with low resistance waveform. LEFT CAROTID ARTERY: No significant calcified disease of the left common carotid artery. Intermediate waveform maintained. Heterogeneous plaque at the left carotid bifurcation without significant calcifications. Low resistance waveform of the left ICA. LEFT VERTEBRAL ARTERY:  Antegrade flow with low resistance waveform. Additional: Right thyroid nodule measures 1.8 cm. IMPRESSION: Color duplex indicates minimal heterogeneous plaque, with no hemodynamically significant stenosis by duplex criteria in  the extracranial cerebrovascular circulation. Incidental finding of right thyroid nodule measuring 1.8 cm. Dedicated thyroid ultrasound recommended if further characterisation warranted. Signed, Dulcy Fanny. Dellia Nims, RPVI [facility] Electronically Signed   By: Corrie Mckusick D.O.   On: 03/11/2018 09:41   Mr Jodene Nam Head/brain WJ Cm  Result Date: 03/11/2018 CLINICAL DATA:  Headache, new, malignancy suspected. Dizziness and weakness for 5 days EXAM: MRI HEAD WITHOUT CONTRAST MRA HEAD WITHOUT CONTRAST TECHNIQUE: Multiplanar, multiecho pulse sequences of the brain and surrounding structures were obtained without intravenous contrast. Angiographic images of the head were obtained using MRA technique without contrast. COMPARISON:  None. FINDINGS: MRI HEAD FINDINGS Brain: No acute infarction, hemorrhage, hydrocephalus, extra-axial collection or mass lesion. Mild patchy FLAIR hyperintensity in the pons. Even  milder periventricular FLAIR hyperintensity. Normal brain volume. Partially empty sella considered incidental in isolation. Vascular: Arterial findings below. Normal dural venous sinus flow voids. 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 No major branch occlusion or flow limiting stenosis. Negative for vessel beading. IMPRESSION: Brain MRI: 1. No acute finding or specific explanation for headache. 2. Mild signal abnormality in the pons and periventricular white matter that is usually from chronic small vessel ischemia. Intracranial MRA: 1. 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 2. No emergent finding. Electronically Signed   By: Monte Fantasia M.D.   On: 03/11/2018 09:08   Mr Brain Wo Contrast  Result  Date: 04/03/2018 CLINICAL DATA:  Stroke follow-up EXAM: MRI HEAD WITHOUT CONTRAST TECHNIQUE: Multiplanar, multiecho pulse sequences of the brain and surrounding structures were obtained without intravenous contrast. COMPARISON:  Brain MRI 03/30/2018 and head CT 04/03/2018 FINDINGS: BRAIN: Compared to the prior MRI of 03/26/2018 there has been progression of bilateral watershed distribution infarcts, left-greater-than-right. The amount of ischemia is worst in the left frontal lobe. There is no midline shift or other mass effect. Partially empty sella. There is cytotoxic edema greatest in the superior anterior frontal lobes. Multifocal white matter hyperintensity, most commonly due to chronic ischemic microangiopathy. The cerebral and cerebellar volume are age-appropriate. Susceptibility-sensitive sequences show no chronic microhemorrhage or superficial siderosis. VASCULAR: Major intracranial arterial and venous sinus flow voids are normal. SKULL AND UPPER CERVICAL SPINE: Calvarial bone marrow signal is normal. There is no skull base mass. Visualized upper cervical spine and soft tissues are normal. SINUSES/ORBITS: No fluid levels or advanced mucosal thickening. No mastoid or middle ear effusion. The orbits are normal. IMPRESSION: 1. Worsening bilateral watershed distribution infarcts compared to MRI of 03/21/2018. Compared to the CT from earlier today, the distribution appears unchanged, allowing for differences in modality. 2. No hemorrhage or mass effect. Electronically Signed   By: Ulyses Jarred M.D.   On: 04/03/2018 20:33    PHYSICAL EXAM  Temp:  [98.7 F (37.1 C)-102 F (38.9 C)] 101 F (38.3 C) (02/03 1200) Pulse Rate:  [80-124] 117 (02/03 1300) Resp:  [10-30] 20 (02/03 1300) BP: (107-149)/(69-116) 132/78 (02/03 1300) SpO2:  [90 %-100 %] 100 % (02/03 1300) FiO2 (%):  [40 %-80 %] 40 % (02/03 1200) Weight:  [614 kg] 119 kg (02/03 0500)  General - Well nourished, well developed, intubated on  precedex.  Ophthalmologic - fundi not visualized due to noncooperation.  Cardiovascular - Regular rate and rhythm.  Neurological Exam :  - intubated on precedex, eyes open with voice, but not following commands. Intermittent chewing movementsEyes conjugate right gaze preference, not blinking to visual threat bilaterally, but blinking spontaneously, doll's eyes present, not tracking, PERRL. Oral involuntary movement with chewing. Corneal reflex present bilaterally, gag and cough present. Breathing over the vent.  Facial symmetry not able to test due to ET tube.  Tongue midline in mouth. On pain stimulation, no withdraw in extremities but brisk babinski.  Increased muscle tone right arm and leg> left arm and leg also axial increased tone,. DTR 1+ and brisk positive bilateral babinski. Sensation, coordination and gait not tested.   ASSESSMENT/PLAN Ms. Rebecca Cox is a 58 y.o. female with history of CKD, CAD, OSA, liver tumor, cerebral aneurysm admitted for right  arm and left leg weakness found after aneurysm coiling. No tPA given due to right groin severe bleeding.    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  MRI - bilateral ACA and MCA/ACA watershed scattered infarcts  MRA - right ACA stenting  CT 04/02/2018 no bleeding, involving lateral ACA and MCA/ACA infarcts  MRI repeat 04/03/2018 showed extensive bilateral ACA and MCA/ACA infarcts  Carotid Doppler unremarkable  2D Echo EF 60 to 65%  LDL 96  HgbA1c 4.5  Heparin subcu for VTE prophylaxis  aspirin 325 mg daily and clopidogrel 75 mg daily prior to admission, now on aspirin 81 mg daily and Brilinta.  Ongoing aggressive stroke risk factor management  Therapy recommendations:  Pending  Disposition: Pending, a long discussion with husband at bedside regarding [place] and DNR.  Husband still needs time to think about it. He declined palliative consult.  CODE STATUS full code at this time  Vegetative state with increased muscle tone bilaterally  Patient developed diffuse increased muscle tone in all extremities and axially  MRI showed bilateral ACA and MCA/ACA infarcts  MRI and repeat extensive bilateral ACA and MCA/ACA infarcts, progressed from prior MRI  EEG no seizure  Resume home Wellbutrin and Effexor as well as Topamax  Resume home Klonopin 0.5 mg twice daily  Supportive care   Hypotension   BP 110s  Now off neo   BP goal > 110  Fever   Tmax 100.9  CXR - Mild bilateral multifocal infiltrates  UA  - unremarkable   Blood culture -  Strep anginosis  CCM on board  Sputum culture - Gram positive cocci - Gram positive rods - Gram negative rods - few ecoli in trache aspirate  AKI   Creatinine 1.04-1.05-1.78-0.97-0.85-0.9  On IVF   oligouria resolved  CCM on board  Close monitoring  ACOM aneurysm  Status post ACOM stent placement  On aspirin and Brilinta  Heparin IV discontinued  Dr.Deveshwar following  Right groin hematoma s/p right deep femoral A repair  Status post cerebral angiogram  VVS on board  P drainage off - on wound vac  Close vascular check  Continue aspirin and Brilinta  Severe anemia due to blood loss  Hemoglobin down from 12.8->6.1, received 4 units PRBC  hemoglobin 7.6 04/07/2018 - PRBC  Stool occult blood Negative   Consider CT abdomen to rule out peritoneal hemorrhage if needed. Appreciate CCM following.   Hyperlipidemia  Home meds:  Zocor 20   LDL 96, goal < 70  Now on Zocor 40  Continue statin at discharge  Other Stroke Risk Factors  Morbid Obesity, Body mass index is 43.66 kg/m.   Obstructive sleep apnea, on CPAP at home  Other Active Problems  Leukocytosis  16.1-15.6-19.0-7.5-6.5 -> 7.3 -> 9.4  Hospital day # 7  This patient is critically ill due to bilateral ACA, MCA/ACA territory infarcts, severe anemia requiring blood transfusion, groin hematoma with femoral artery repair, hypotension, leukocytosis and at significant risk of neurological worsening, death form recurrent stroke, hemorrhagic conversion, seizure, hypovolemic shock, sepsis.   This patient is critically ill and at significant risk of neurological worsening, death and care requires constant monitoring of vital signs, hemodynamics,respiratory and cardiac monitoring,review of multiple databases, neurological assessment, discussion with family, other specialists and medical decision making of high complexity.I  I spent 35 minutes of neurocritical care time in the care of this patient.  patient's prognosis is very poor and I would like to discuss this with her husband who is not been available. Dr. Estanislado Pandy plans to meet with him later today. I'm available if needed to discuss further with husband and is willing Antony Contras, [place] Pager: [phone] 04/08/2018 2:39 PM  To contact Stroke Continuity provider, please refer to [email or web address] After hours, contact General Neurology

## 2018-04-08 NOTE — Progress Notes (Signed)
PCCM INTERVAL PROGRESS NOTE  This is a 58 year old female with history of liver cancer and cerebral aneurysm who presents to PCCM for vent management postop.  Patient have the aneurysm coiled in during the procedure developed a bifrontal CVA and a right groin hematoma.  She returned to the OR for evacuation of the hematoma and repair of the vessel.  Heparin was reversed and patient was transfused 2 units of packed red blood cells. Course has since been complicated by pneumonia, bateremia, hypervolemia, and poor mental status with inability to wean form the vent.   Tonight I am being called for poor vent synchrony and desaturations despite vent changes made by EMD.   CXR markedly worse from 2 days ago and significant for bilateral infiltrates. She is 21L positive for the admission raising concern for pulmonary edema. Will order lasix 40mg  and supp K.   I have changed her to pressure control 18/8 and extended her inspiratory time to 1 second. She seems to tolerate these settings much better. Respiratory therapy is aware of the importance of frequent vent checks to ensure tidal volumes are approximately in the 8cc/kg IBW (450cc) range and will collect ABG in a half hour.    Georgann Housekeeper, AGACNP-BC [place] Pager [phone] or [phone]  04/08/2018 12:22 AM

## 2018-04-08 NOTE — Progress Notes (Signed)
fio2 dropped to 50 per Sp02of 100%.

## 2018-04-08 NOTE — Progress Notes (Signed)
Pt transported from 4N16 to CT and back with no complications.

## 2018-04-08 NOTE — Progress Notes (Signed)
VASCULAR SURGERY:  Her Praveena dressing has been on for 1 week.  I remove this today.  I have written orders for the nurses to just keep dry 4 x 4's in the right groin and change as needed.  Given her morbid obesity will continue to have to keep an eye on the wound.  [name], MD, [facility] [phone] Office: [phone]

## 2018-04-08 NOTE — Progress Notes (Signed)
NAME:  Rebecca Cox, MRN:  [id number], DOB:  Jun 11, 1960, LOS: 7 ADMISSION DATE:  03/07/2018, CONSULTATION DATE: 04/04/2018 REFERRING MD:  Patrecia Pour, CHIEF COMPLAINT: Acute respiratory failure  Brief History   This is a 58 year old female with history of liver cancer and cerebral aneurysm who presents to PCCM for vent management postop.  Patient have the aneurysm coiled in during the procedure developed a bifrontal CVA and a right groin hematoma.  She returned to the OR for evacuation of the hematoma and repair of the vessel.  Heparin was reversed and patient was transfused 2 units of packed red blood cells.  Significant Hospital Events   1/27 > anterior communicating aneurysm embolization, post procedure code stroke, right femoral hematoma  Consults:  PCCM Neurology Vascular Surgery IR  Procedures:  1/27 >  anterior communicating aneurysm embolization 1/27 > ETT 1/27 R aline >> 1/28  Significant Diagnostic Tests:  MR I 03/09/2018 >>  Bilateral watershed distribution cortical and subcortical edema and restricted diffusion, more extensive on the left than the right.  Differential diagnosis is true watershed infarction versus vaso regulatory disturbance secondary to arterial spasm, which is demonstrated in the anterior cerebral vessels by MR angiography. 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  1/28 CTH >> 1. Expected progression of bilateral watershed territory infarcts, left greater than right. 2. No new infarct territory or hemorrhage. 3. ACA stent.  1/28 EEG> abnormal EEG, generalized background slowing. No epileptiform activity.   1/29 CT Head>  Stable watershed distribution bilaterally, new acute intracranial abnormality   1/29 MRI brain> worsening watershed infarcts compared to 1/27 MRI. No mass effect, no hemorrhage.   1/30 ECHO> LVEF 60-65%, no wall motion abnormality CXR 1/31> bilateral opacities; pulm edema pattern  Micro Data:  1/27  MRSA PCR >>neg  Antimicrobials:   1/27 cefazolin preop Cefepime 2/2 >>   Interim history/subjective:  Vent dyssynchrony reported overnight on current level of sedation.  She was changed to pressure control ventilation with some improvement.  Her vent is alarming, appears to coincide with her biting her endotracheal tube. Precedex 0.5, fentanyl 100, phenylephrine 10 and weaning   Objective   Blood pressure (!) 147/94, pulse (!) 111, temperature 99.1 F (37.3 C), temperature source Axillary, resp. rate (!) 28, height 5\' 5"  (1.651 m), weight 119 kg, SpO2 97 %.    Vent Mode: PCV FiO2 (%):  [40 %-80 %] 40 % Set Rate:  [16 bmp-22 bmp] 20 bmp Vt Set:  [450 mL] 450 mL PEEP:  [5 cmH20-8 cmH20] 8 cmH20 Plateau Pressure:  [20 cmH20] 20 cmH20   Intake/Output Summary (Last 24 hours) at 04/08/2018 1208 Last data filed at 04/08/2018 1200 Gross per 24 hour  Intake 4575.36 ml  Output 3401 ml  Net 1174.36 ml   Filed Weights   04/06/18 0344 04/07/18 0500 04/08/18 0500  Weight: 118.5 kg 119.6 kg 119 kg    Examination: General: Ill-appearing obese woman, sedated and intubated HEENT: Endotracheal tube in good position, oropharynx otherwise clear Neuro: Currently sedated, grimace with pain.  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  P:  She does not have the mental status to be able to protect her airway.  Not a candidate for extubation.  She may be able to tolerate pressure support mode although she has positive fluid balance, bilateral infiltrates and suspected pneumonia. Antibiotics as above VAP prevention orders If family wants to push forward for longer support then she will likely need trach.   Positive cumulative fluid balance, hypervolemia  Pneumonia E coli (pan sens) and S viridans Bacteremia S anginosis, pan sens Cefepime should cover both, continue same.  TTE 1/30 without any evidence endocarditis. May need TEE depending on the course and goals of care.   Right groin hematoma, s/p repair  -s/p 2 PRBC 1/27 -JP drain removed 1/30, wound VAC remains in place P:  Follow CBC for stability  Acute Anemia (1/31) -etiology hemodilution related to IVF vs acute blood loss  -Hgb 7 from 8.4 s/p JP drain removal, RN noted oozing around removal site late 1/30.  P Conservative threshold for transfusion goal hemoglobin greater than 7   Electrolyte abnormality -Hypomagnesemia -Hypophosphatemia    -Hypokalemia P:  Follow BMP and replace as indicated  Diabetes Mellitus  P:  Follow CBG, start SSI if consistently > 180  Malnutrition, at risk P TF's  Agitation, delirium precedex and fentanyl, avoid oversedation  Continue home topiramine, SSRI, clonazepam  Best practice:  Diet: Enteral nutrition Pain/Anxiety/Delirium protocol (if indicated): Precedex gtt, PRN fent and PRN versed VAP protocol (if indicated): Yes DVT prophylaxis:  SCD, SQ Heparin  GI prophylaxis: protonix Glucose control: q4CBG Mobility: Bedrest Code Status: Full  Family Communication: No family present 2/3 Disposition:  Continue ICU level of care   Labs   CBC:  Recent Labs  Lab 04/02/18 0449   04/05/18 0553  04/05/18 1818 04/06/18 0500 04/07/18 0556 04/08/18 0103 04/08/18 0555  WBC 15.6*   < > 6.5  --  7.3 9.4 13.4*  --  19.3*  NEUTROABS 13.1*  --   --   --   --   --   --   --  17.3*  HGB 11.6*   < > 7.0*   < > 8.0* 8.0* 7.6* 7.1* 7.5*  HCT 34.7*   < > 21.8*   < > 24.5* 24.1* 23.9* 21.0* 23.8*  MCV 87.0   < > 87.9  --  86.9 88.3 89.8  --  91.2  PLT 152   < > 89*  --  94* 105* 121*  --  165   < > = values in this interval not displayed.    Basic Metabolic Panel: Recent Labs  Lab 04/03/18 0724 04/03/18 1637 04/04/18 0432 04/04/18 1045 04/04/18 1748 04/05/18 0553 04/05/18 1818 04/06/18 0500 04/07/18 0556 04/08/18 0103  NA 141  --   --  139  --  140  --  138 141 142  K 4.2  --   --  3.3*  --  3.1*  --  3.5 3.6 4.1  CL 118*  --   --  117*  --  113*  --  111 116*  --   CO2 19*  --   --  17*  --  22  --  19* 19*  --   GLUCOSE 138*  --   --  156*  --  145*  --  127* 116*  --   BUN 32*  --   --  28*  --  25*  --  24* 24*  --   CREATININE 1.78*  --   --  0.97  --  0.85  --  0.85 0.90  --   CALCIUM 8.5*  --   --  8.6*  --  8.6*  --  8.7* 8.2*  --   MG 1.6* 1.5* 1.9  --  1.7 1.6* 1.9  --   --   --   PHOS 3.2 1.9* 1.8*  --  1.6* 1.7* 2.3*  --   --   --    GFR: Estimated Creatinine Clearance: 91.2 mL/min (by C-G formula based on SCr of 0.9 mg/dL). Recent Labs  Lab 04/05/18 1818 04/06/18 0500 04/07/18 0556 04/08/18 0555  WBC 7.3 9.4 13.4* 19.3*    Liver Function Tests: No results for input(s): AST, ALT, ALKPHOS, BILITOT, PROT, ALBUMIN in the last 168 hours. No results for input(s): LIPASE, AMYLASE in the last 168 hours. No results for input(s): AMMONIA in the last 168 hours.  ABG    Component Value Date/Time   PHART 7.356 04/08/2018 0103   PCO2ART 34.8 04/08/2018 0103   PO2ART 115.0 (H) 04/08/2018 0103   HCO3 19.4 (L) 04/08/2018 0103   TCO2 20 (L) 04/08/2018 0103   ACIDBASEDEF 5.0 (H) 04/08/2018 0103   O2SAT 98.0 04/08/2018 0103     Coagulation  Profile: Recent Labs  Lab 03/20/2018 1608  INR 1.24    Cardiac Enzymes: Recent Labs  Lab 04/02/18 1236  CKTOTAL 103    HbA1C: Hgb A1c MFr Bld  Date/Time Value Ref Range Status  03/11/2018 06:22 AM 4.7 (L) 4.8 - 5.6 % Final    Comment:    (NOTE) Pre diabetes:          5.7%-6.4% Diabetes:              >6.4% Glycemic control for   <7.0% adults with diabetes     CBG: Recent Labs  Lab 04/07/18 1910 04/07/18 2301 04/08/18 0304 04/08/18 0734 04/08/18 1149  GLUCAP 99 106* 116* 123* 101*    Independent CC time 32 minutes  Baltazar Apo, MD, PhD 04/08/2018, 12:22 PM [facility] [phone] or if no answer [phone]

## 2018-04-09 ENCOUNTER — Inpatient Hospital Stay (HOSPITAL_COMMUNITY): Payer: Medicare Other

## 2018-04-09 LAB — GLUCOSE, CAPILLARY
GLUCOSE-CAPILLARY: 93 mg/dL (ref 70–99)
Glucose-Capillary: 106 mg/dL — ABNORMAL HIGH (ref 70–99)
Glucose-Capillary: 103 mg/dL — ABNORMAL HIGH (ref 70–99)
Glucose-Capillary: 117 mg/dL — ABNORMAL HIGH (ref 70–99)
Glucose-Capillary: 104 mg/dL — ABNORMAL HIGH (ref 70–99)
Glucose-Capillary: 109 mg/dL — ABNORMAL HIGH (ref 70–99)

## 2018-04-09 MED ORDER — SODIUM CHLORIDE 0.9 % IV SOLN
2.0000 g | INTRAVENOUS | Status: DC
  Administered 2018-04-09 – 2018-04-10 (×2): 2 g via INTRAVENOUS
  Filled 2018-04-09 (×3): qty 20

## 2018-04-09 NOTE — Progress Notes (Signed)
EEG completed, results pending. 

## 2018-04-09 NOTE — Procedures (Signed)
Rebecca A. Merlene Laughter, MD     [email or web address]           HISTORY: The patient is a 58 year old lady who underwent an original repair surgery by intravascular approach.  Unfortunately, the surgery was complicated by groin hematoma and bioccipital infarcts.  The patient is currently comatose.  The studies been done to evaluate for nonconvulsive seizures.  MEDICATIONS: Scheduled Meds: . aspirin  81 mg Oral Daily  . buPROPion  100 mg Per Tube TID  . chlorhexidine gluconate (MEDLINE KIT)  15 mL Mouth Rinse BID  . Chlorhexidine Gluconate Cloth  6 each Topical Daily  . clonazePAM  0.5 mg Per Tube BID  . feeding supplement (PRO-STAT SUGAR FREE 64)  30 mL Per Tube BID  . heparin injection (subcutaneous)  5,000 Units Subcutaneous Q8H  . mouth rinse  15 mL Mouth Rinse 10 times per day  . multivitamin with minerals  1 tablet Per Tube BID  . pantoprazole sodium  40 mg Per Tube Daily  . simvastatin  40 mg Per Tube q morning - 10a  . sodium chloride flush  10-40 mL Intracatheter Q12H  . ticagrelor  90 mg Per Tube BID  . topiramate  50 mg Per Tube QHS  . venlafaxine  100 mg Per Tube BID WC   Continuous Infusions: . sodium chloride Stopped (04/06/18 1337)  . cefTRIAXone (ROCEPHIN)  IV Stopped (04/09/18 1049)  . dexmedetomidine (PRECEDEX) IV infusion 0.4 mcg/kg/hr (04/09/18 1418)  . feeding supplement (VITAL HIGH PROTEIN) 1,000 mL (04/08/18 2000)  . fentaNYL infusion INTRAVENOUS 100 mcg/hr (04/09/18 1400)  . lactated ringers 10 mL/hr at 04/09/18 1400  . phenylephrine (NEO-SYNEPHRINE) Adult infusion Stopped (04/08/18 1236)   PRN Meds:.sodium chloride, acetaminophen **OR** acetaminophen (TYLENOL) oral liquid 160 mg/5 mL **OR** acetaminophen, bisacodyl, docusate, fentaNYL, midazolam, polyethylene glycol, sodium chloride flush  Prior to Admission medications   Medication Sig Start Date End Date Taking? Authorizing Provider  aspirin 325 MG EC tablet Take 325 mg by mouth  daily. " And as needed if headache continues"   Yes [provider]  B-COMPLEX-C PO Take 1 tablet by mouth daily.   Yes [provider]  buPROPion (WELLBUTRIN SR) 150 MG 12 hr tablet Take 150 mg by mouth 2 (two) times daily.   Yes [provider]  celecoxib (CELEBREX) 200 MG capsule Take 200 mg by mouth daily. 5pm   Yes [provider]  clonazePAM (KLONOPIN) 0.5 MG tablet Take 0.5 mg by mouth 2 (two) times daily. 1 tablet in the morning and take 1 tablet at 5p. May take additional half-whole tablet throughout the day as needed for anxiety   Yes [provider]  clopidogrel (PLAVIX) 75 MG tablet Take 75 mg by mouth daily.   Yes [provider]  metoprolol succinate (TOPROL-XL) 25 MG 24 hr tablet Take 12.5 mg by mouth every morning.   Yes [provider]  pantoprazole (PROTONIX) 40 MG tablet Take 40 mg by mouth every morning.   Yes [provider]  polycarbophil (FIBERCON) 625 MG tablet Take 625 mg by mouth 2 (two) times daily.   Yes [provider]  simvastatin (ZOCOR) 20 MG tablet Take 20 mg by mouth every morning.   Yes [provider]  topiramate (TOPAMAX) 25 MG tablet Take 25 mg by mouth daily. 2m at night for one week then 50 mg at night thereafter   Yes [provider]  venlafaxine XR (EFFEXOR-XR) 150 MG 24 hr capsule Take 150  mg by mouth 2 (two) times daily.   Yes [provider]      ANALYSIS: A 16 channel recording using standard 10 20 measurements is conducted for 21 minutes.  The background activity gets as high as 5 to 6 Hz.  However, the recording does have 3 to 4 Hz delta activity.  There is beta activity observed in the frontal areas.  Photic stimulation and hyperventilation are not conducted.  There is no focal or lateralized slowing.  There is no epileptiform activity is observed.   IMPRESSION: This recording is abnormal showing global slowing indicating a global  encephalopathy.  However, there is no epileptiform activity is observed.      Rebecca Cox A. Merlene Cox, M.D.  Diplomate, Tax adviser of Psychiatry and Neurology ( Neurology).

## 2018-04-09 NOTE — Progress Notes (Signed)
NAME:  Rebecca Cox, MRN:  [id number], DOB:  Nov 24, 1960, LOS: 8 ADMISSION DATE:  03/18/2018, CONSULTATION DATE: 03/31/2018 REFERRING MD:  Patrecia Pour, CHIEF COMPLAINT: Acute respiratory failure  Brief History   This is a 58 year old female with history of liver cancer and cerebral aneurysm who presents to PCCM for vent management postop.  Patient have the aneurysm coiled and during the procedure developed a bifrontal CVA and a right groin hematoma.  She returned to the OR for evacuation of the hematoma and repair of the vessel.  Heparin was reversed and patient was transfused 2 units of packed red blood cells.  Significant Hospital Events   1/27 > anterior communicating aneurysm embolization, post procedure code stroke, right femoral hematoma  Consults:  PCCM Neurology Vascular Surgery IR  Procedures:  1/27 >  anterior communicating aneurysm embolization 1/27 > ETT 1/27 R aline >> 1/28  Significant Diagnostic Tests:  MR I 03/24/2018 >>  Bilateral watershed distribution cortical and subcortical edema and restricted diffusion, more extensive on the left than the right.  Differential diagnosis is true watershed infarction versus vaso regulatory disturbance secondary to arterial spasm, which is demonstrated in the anterior cerebral vessels by MR angiography. 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  1/28 CTH >> 1. Expected progression of bilateral watershed territory infarcts, left greater than right. 2. No new infarct territory or hemorrhage. 3. ACA stent.  1/28 EEG> abnormal EEG, generalized background slowing. No epileptiform activity.   1/29 CT Head>  Stable watershed distribution bilaterally, new acute intracranial abnormality   1/29 MRI brain> worsening watershed infarcts compared to 1/27 MRI. No mass effect, no hemorrhage.   1/30 ECHO> LVEF 60-65%, no wall motion abnormality CXR 1/31> bilateral opacities; pulm edema pattern  Micro Data:  1/27  MRSA PCR >>neg  Antimicrobials:   1/27 cefazolin preop Cefepime 2/2 >>   Interim history/subjective:  Remains ventilated. Tolerating SBT.    Objective   Blood pressure 135/82, pulse (!) 116, temperature 99.8 F (37.7 C), temperature source Axillary, resp. rate (!) 22, height 5\' 5"  (1.651 m), weight 120.5 kg, SpO2 100 %.    Vent Mode: CPAP;PSV FiO2 (%):  [30 %-40 %] 30 % Set Rate:  [20 bmp] 20 bmp Vt Set:  [450 mL] 450 mL PEEP:  [8 cmH20] 8 cmH20 Pressure Support:  [12 cmH20] 12 cmH20 Plateau Pressure:  [20 cmH20-24 cmH20] 20 cmH20   Intake/Output Summary (Last 24 hours) at 04/09/2018 0900 Last data filed at 04/09/2018 0800 Gross per 24 hour  Intake 2652.84 ml  Output 1650 ml  Net 1002.84 ml   Filed Weights   04/07/18 0500 04/08/18 0500 04/09/18 0426  Weight: 119.6 kg 119 kg 120.5 kg    Examination: General: Ill-appearing obese woman, sedated and intubated HEENT: Endotracheal tube in good position, oropharynx otherwise clear Neuro: Currently sedated,Eyes open and gaze deviated to up and to right with oral automatisms. No response to painful stimuli with some spontaneous movement of RLE. CV: Regular, no murmur PULM: Clear, no crackles or wheezes, No sys GI: Soft, nondistended with positive bowel sounds Extremities: Bilateral 1+ edema Skin: Scattered ecchymoses  Resolved Hospital Problem list   AKI  Assessment & Plan:   Critically ill due to respiratory failure requiring mechanical ventilation from inability to protect airway. Comatose due to perioperative watershed infarcts  Agitation requiring titration of sedative infusions Bacteremia from S anginosis  Polymicrobial pneumonia including viridans streptococcus.  Status post repair of groin hematoma  PLAN:  Continue daily SBT, liberation  will be dictated by mental status. Will likely require tracheostomy if anticipating prolonged course. Repeat EEG to rule out seizures. Minimize sedation. Overall prognosis is  poor given extent of neurological injury. Dr Leonie Man to readdress goals of care. Continue ceftriaxone for S. Anginosis for optimal CSF penetration - relationship of bacteremia and aneurysm/bleed unclear.    Best practice:  Diet: Enteral nutrition Pain/Anxiety/Delirium protocol (if indicated): Precedex gtt, PRN fent and PRN versed VAP protocol (if indicated): Yes DVT prophylaxis:  SCD, SQ Heparin  GI prophylaxis: protonix Glucose control: q4CBG Mobility: Bedrest Code Status: Full  Family Communication: No family present 2/4 Disposition:  Continue ICU level of care   Labs   CBC:  Recent Labs  Lab 04/05/18 0553  04/05/18 1818 04/06/18 0500 04/07/18 0556 04/08/18 0103 04/08/18 0555  WBC 6.5  --  7.3 9.4 13.4*  --  19.3*  NEUTROABS  --   --   --   --   --   --  17.3*  HGB 7.0*   < > 8.0* 8.0* 7.6* 7.1* 7.5*  HCT 21.8*   < > 24.5* 24.1* 23.9* 21.0* 23.8*  MCV 87.9  --  86.9 88.3 89.8  --  91.2  PLT 89*  --  94* 105* 121*  --  165   < > = values in this interval not displayed.    Basic Metabolic Panel: Recent Labs  Lab 04/03/18 0724 04/03/18 1637 04/04/18 0432 04/04/18 1045 04/04/18 1748 04/05/18 0553 04/05/18 1818 04/06/18 0500 04/07/18 0556 04/08/18 0103  NA 141  --   --  139  --  140  --  138 141 142  K 4.2  --   --  3.3*  --  3.1*  --  3.5 3.6 4.1  CL 118*  --   --  117*  --  113*  --  111 116*  --   CO2 19*  --   --  17*  --  22  --  19* 19*  --   GLUCOSE 138*  --   --  156*  --  145*  --  127* 116*  --   BUN 32*  --   --  28*  --  25*  --  24* 24*  --   CREATININE 1.78*  --   --  0.97  --  0.85  --  0.85 0.90  --   CALCIUM 8.5*  --   --  8.6*  --  8.6*  --  8.7* 8.2*  --   MG 1.6* 1.5* 1.9  --  1.7 1.6* 1.9  --   --   --   PHOS 3.2 1.9* 1.8*  --  1.6* 1.7* 2.3*  --   --   --    GFR: Estimated Creatinine Clearance: 91.9 mL/min (by C-G formula based on SCr of 0.9 mg/dL). Recent Labs  Lab 04/05/18 1818 04/06/18 0500 04/07/18 0556 04/08/18 0555  WBC 7.3 9.4  13.4* 19.3*    Liver Function Tests: No results for input(s): AST, ALT, ALKPHOS, BILITOT, PROT, ALBUMIN in the last 168 hours. No results for input(s): LIPASE, AMYLASE in the last 168 hours. No results for input(s): AMMONIA in the last 168 hours.  ABG    Component Value Date/Time   PHART 7.356 04/08/2018 0103   PCO2ART 34.8 04/08/2018 0103   PO2ART 115.0 (H) 04/08/2018 0103   HCO3 19.4 (L) 04/08/2018 0103   TCO2 20 (L) 04/08/2018 0103   ACIDBASEDEF 5.0 (H) 04/08/2018 0103  O2SAT 98.0 04/08/2018 0103     Coagulation Profile: No results for input(s): INR, PROTIME in the last 168 hours.  Cardiac Enzymes: Recent Labs  Lab 04/02/18 1236  CKTOTAL 103    HbA1C: Hgb A1c MFr Bld  Date/Time Value Ref Range Status  03/11/2018 06:22 AM 4.7 (L) 4.8 - 5.6 % Final    Comment:    (NOTE) Pre diabetes:          5.7%-6.4% Diabetes:              >6.4% Glycemic control for   <7.0% adults with diabetes     CBG: Recent Labs  Lab 04/08/18 1533 04/08/18 1932 04/08/18 2314 04/09/18 0320 04/09/18 0735  GLUCAP 111* 110* 116* 106* 103*   CRITICAL CARE Performed by: Kipp Brood   Total critical care time: 40 minutes  Critical care time was exclusive of separately billable procedures and treating other patients.  Critical care was necessary to treat or prevent imminent or life-threatening deterioration.  Critical care was time spent personally by me on the following activities: development of treatment plan with patient and/or surrogate as well as nursing, discussions with consultants, evaluation of patient's response to treatment, examination of patient, obtaining history from patient or surrogate, ordering and performing treatments and interventions, ordering and review of laboratory studies, ordering and review of radiographic studies, pulse oximetry, re-evaluation of patient's condition and participation in multidisciplinary rounds.  Kipp Brood, MD [facility] ICU  Physician [place]  Pager: [phone] Mobile: [phone] After hours: [phone].

## 2018-04-09 NOTE — Progress Notes (Signed)
STROKE TEAM PROGRESS NOTE   SUBJECTIVE (INTERVAL HISTORY). Husband not at bedside during a.m. rounds but returned during lunchtime when I spoke to him and about patient's poor prognosis and neurological exam and likely survival a vegetative state if ventilatory support was continued. He feels she would not want to have left leg this and needs to discuss with patient's sister and brother before he makes a final decision. He agrees to DO NOT RESUSCITATE and do not escalate care  OBJECTIVE Temp:  [99.1 F (37.3 C)-101 F (38.3 C)] 101 F (38.3 C) (02/04 1600) Pulse Rate:  [97-140] 111 (02/04 1547) Cardiac Rhythm: Sinus tachycardia (02/04 1200) Resp:  [15-35] 20 (02/04 1547) BP: (106-148)/(63-107) 125/83 (02/04 1547) SpO2:  [95 %-100 %] 97 % (02/04 1547) FiO2 (%):  [30 %] 30 % (02/04 1547) Weight:  [120.5 kg] 120.5 kg (02/04 0426)  Recent Labs  Lab 04/08/18 2314 04/09/18 0320 04/09/18 0735 04/09/18 1139 04/09/18 1525  GLUCAP 116* 106* 103* 93 117*   Recent Labs  Lab 04/03/18 0724 04/03/18 1637 04/04/18 0432 04/04/18 1045 04/04/18 1748 04/05/18 0553 04/05/18 1818 04/06/18 0500 04/07/18 0556 04/08/18 0103  NA 141  --   --  139  --  140  --  138 141 142  K 4.2  --   --  3.3*  --  3.1*  --  3.5 3.6 4.1  CL 118*  --   --  117*  --  113*  --  111 116*  --   CO2 19*  --   --  17*  --  22  --  19* 19*  --   GLUCOSE 138*  --   --  156*  --  145*  --  127* 116*  --   BUN 32*  --   --  28*  --  25*  --  24* 24*  --   CREATININE 1.78*  --   --  0.97  --  0.85  --  0.85 0.90  --   CALCIUM 8.5*  --   --  8.6*  --  8.6*  --  8.7* 8.2*  --   MG 1.6* 1.5* 1.9  --  1.7 1.6* 1.9  --   --   --   PHOS 3.2 1.9* 1.8*  --  1.6* 1.7* 2.3*  --   --   --    No results for input(s): AST, ALT, ALKPHOS, BILITOT, PROT, ALBUMIN in the last 168 hours. Recent Labs  Lab 04/05/18 0553  04/05/18 1818 04/06/18 0500 04/07/18 0556 04/08/18 0103 04/08/18 0555  WBC 6.5  --  7.3 9.4 13.4*  --  19.3*   NEUTROABS  --   --   --   --   --   --  17.3*  HGB 7.0*   < > 8.0* 8.0* 7.6* 7.1* 7.5*  HCT 21.8*   < > 24.5* 24.1* 23.9* 21.0* 23.8*  MCV 87.9  --  86.9 88.3 89.8  --  91.2  PLT 89*  --  94* 105* 121*  --  165   < > = values in this interval not displayed.   No results for input(s): CKTOTAL, CKMB, CKMBINDEX, TROPONINI in the last 168 hours. No results for input(s): LABPROT, INR in the last 72 hours. No results for input(s): COLORURINE, LABSPEC, [place], GLUCOSEU, HGBUR, BILIRUBINUR, KETONESUR, PROTEINUR, UROBILINOGEN, NITRITE, LEUKOCYTESUR in the last 72 hours.  Invalid input(s): APPERANCEUR     Component Value Date/Time   CHOL 175 03/11/2018  0624   TRIG 155 (H) 03/11/2018 0624   HDL 48 03/11/2018 0624   CHOLHDL 3.6 03/11/2018 0624   VLDL 31 03/11/2018 0624   LDLCALC 96 03/11/2018 0624   Lab Results  Component Value Date   HGBA1C 4.7 (L) 03/11/2018      Component Value Date/Time   LABOPIA NONE DETECTED 03/10/2018 1210   COCAINSCRNUR NONE DETECTED 03/10/2018 1210   LABBENZ POSITIVE (A) 03/10/2018 1210   AMPHETMU NONE DETECTED 03/10/2018 1210   THCU NONE DETECTED 03/10/2018 1210   LABBARB NONE DETECTED 03/10/2018 1210    No results for input(s): ETH in the last 168 hours.  I have personally reviewed the radiological images below and agree with the radiology interpretations.  Ct Head Wo Contrast  Result Date: 04/02/2018 CLINICAL DATA:  Stroke, follow-up. EXAM: CT HEAD WITHOUT CONTRAST TECHNIQUE: Contiguous axial images were obtained from the base of the skull through the vertex without intravenous contrast. COMPARISON:  MRI brain 03/25/2018. FINDINGS: Brain: Bilateral watershed territory infarcts are confirmed as hypoattenuation involving the cortex, left greater than right. There is no associated hemorrhage. Left ACA stent is stable in position. There is no hemorrhage or new infarct. Ventricles are of normal size. Insert pass fluid Vascular: ACA stent is in place. No  significant atherosclerotic calcifications are present. There is no hyperdense vessel. Skull: Calvarium is intact. No focal lytic or blastic lesions are present. Sinuses/Orbits: The paranasal sinuses and mastoid air cells are clear. The globes and orbits are within normal limits. IMPRESSION: 1. Expected progression of bilateral watershed territory infarcts, left greater than right. 2. No new infarct territory or hemorrhage. 3. ACA stent. Electronically Signed   By: San Morelle M.D.   On: 04/02/2018 09:51   Ct Head Wo Contrast  Result Date: 03/10/2018 CLINICAL DATA:  Acute onset dizziness and difficulty walking today. EXAM: CT HEAD WITHOUT CONTRAST TECHNIQUE: Contiguous axial images were obtained from the base of the skull through the vertex without intravenous contrast. COMPARISON:  None. FINDINGS: Brain: No evidence of acute infarction, hemorrhage, hydrocephalus, extra-axial collection or mass lesion/mass effect. Vascular: No hyperdense vessel or unexpected calcification. Skull: Normal. Negative for fracture or focal lesion. Sinuses/Orbits: Negative. Other: None. IMPRESSION: Negative head CT. Electronically Signed   By: Inge Rise M.D.   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 Acute onset of right arm and leg weakness. EXAM: MRI HEAD WITHOUT CONTRAST MRA HEAD WITHOUT CONTRAST TECHNIQUE: Multiplanar, multiecho pulse sequences of the brain and surrounding structures were obtained without intravenous contrast. Angiographic images of the head were obtained using MRA technique without contrast. COMPARISON:  Angiographic images same day.  MRI 03/12/2018. FINDINGS: MRI HEAD FINDINGS Brain: The brain shows bilateral watershed distribution cortical and subcortical edema and restricted diffusion, affecting both hemispheres but more extensive on the left than the right. The  question is if this represents true bilateral watershed infarction or if it represents a vaso regulatory disturbance related to spasm. No specific vascular territory infarction is seen. No sign of hemorrhage. Chronic small-vessel ischemic changes affect the brainstem and the cerebral hemispheric white matter as seen previously. No hydrocephalus. No extra-axial collection. Vascular: Major vessels at the base of the brain show flow. 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 There does appear to be a spasm appearance of the A1  vessel on the left and both anterior cerebral arteries, more extensively the right than the left. 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 No middle cerebral artery abnormality is seen. Posterior circulation vessels continued to show a normal appearance. IMPRESSION: Bilateral watershed distribution cortical and subcortical edema and restricted diffusion, more extensive on the left than the right. Differential diagnosis is true watershed infarction versus vaso regulatory disturbance secondary to arterial spasm, which is demonstrated in the anterior cerebral vessels by MR angiography. 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 Electronically Signed   By: Nelson Chimes M.D.   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 Acute onset of right arm and leg weakness. EXAM: MRI HEAD WITHOUT CONTRAST MRA HEAD WITHOUT CONTRAST TECHNIQUE: Multiplanar, multiecho pulse sequences of the brain and surrounding structures were obtained without intravenous contrast. Angiographic images of the head were obtained using MRA technique without contrast. COMPARISON:  Angiographic images same day.   MRI 03/12/2018. FINDINGS: MRI HEAD FINDINGS Brain: The brain shows bilateral watershed distribution cortical and subcortical edema and restricted diffusion, affecting both hemispheres but more extensive on the left than the right. The question is if this represents true bilateral watershed infarction or if it represents a vaso regulatory disturbance related to spasm. No specific vascular territory infarction is seen. No sign of hemorrhage. Chronic small-vessel ischemic changes affect the brainstem and the cerebral hemispheric white matter as seen previously. No hydrocephalus. No extra-axial collection. Vascular: Major vessels at the base of the brain show flow. 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 There does appear to be a spasm appearance of the A1 vessel on the left and both anterior cerebral arteries, more extensively the right than the left. 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 No middle cerebral artery abnormality is seen. Posterior circulation vessels continued to show a normal appearance. IMPRESSION: Bilateral watershed distribution cortical and subcortical edema and restricted diffusion, more extensive on the left than the right. Differential diagnosis is true watershed infarction versus vaso regulatory disturbance secondary to arterial spasm, which is demonstrated in the anterior cerebral vessels by MR angiography. 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 Electronically Signed   By: Nelson Chimes M.D.   On: 03/31/2018 16:02   Mr Brain Wo Contrast  Result Date: 03/11/2018 CLINICAL DATA:  Headache, new, malignancy suspected. Dizziness and weakness for 5 days EXAM: MRI HEAD WITHOUT CONTRAST MRA HEAD WITHOUT CONTRAST TECHNIQUE: Multiplanar, multiecho pulse sequences of the brain and  surrounding structures were obtained without intravenous contrast. Angiographic images of the head were obtained using MRA technique without contrast. COMPARISON:  None. FINDINGS: MRI HEAD FINDINGS Brain: No acute infarction, hemorrhage, hydrocephalus, extra-axial collection or mass lesion. Mild patchy FLAIR hyperintensity in the pons. Even milder periventricular FLAIR hyperintensity. Normal brain volume. Partially empty sella considered incidental in isolation. Vascular: Arterial findings below. Normal dural venous sinus flow voids. 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 No major branch occlusion or flow limiting stenosis.  Negative for vessel beading. IMPRESSION: Brain MRI: 1. No acute finding or specific explanation for headache. 2. Mild signal abnormality in the pons and periventricular white matter that is usually from chronic small vessel ischemia. Intracranial MRA: 1. 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 2. No emergent finding. Electronically Signed   By: Monte Fantasia M.D.   On: 03/11/2018 09:08   Mr Brain W Contrast  Result Date: 03/12/2018 CLINICAL DATA:  Liver cancer. Headache. Rule out metastatic disease. EXAM: MRI HEAD WITH CONTRAST TECHNIQUE: Multiplanar, multiecho pulse sequences of the brain and surrounding structures were obtained with intravenous contrast. CONTRAST:  10 mL Gadovist IV COMPARISON:  Unenhanced MRI head 03/11/2018 FINDINGS: Normal enhancement. No enhancing mass lesion. Leptomeningeal enhancement normal. No acute abnormality. See recent unenhanced MRI report. IMPRESSION: No acute abnormality and negative for intracranial metastatic disease. Electronically Signed   By: Franchot Gallo M.D.   On: 03/12/2018 11:16   US  Carotid Bilateral (at [facility] Only)  Result Date: 03/11/2018 CLINICAL DATA:  58 year old female with history of vertigo EXAM: BILATERAL CAROTID DUPLEX ULTRASOUND TECHNIQUE: Pearline Cables scale imaging, color Doppler and duplex ultrasound were performed of bilateral carotid and vertebral arteries in the neck. COMPARISON:  None. FINDINGS: Criteria: Quantification of carotid stenosis is based on velocity parameters that correlate the residual internal carotid diameter with NASCET-based stenosis levels, using the diameter of the distal internal carotid lumen as the denominator for stenosis measurement. The following velocity measurements were obtained: RIGHT ICA:  Systolic 91 cm/sec, Diastolic 34 cm/sec CCA:  416 cm/sec SYSTOLIC ICA/CCA RATIO:  0.9 ECA:  96 cm/sec LEFT ICA:  Systolic 78 cm/sec, Diastolic 38 cm/sec CCA:  93 cm/sec SYSTOLIC ICA/CCA RATIO:  0.8 ECA:  77 cm/sec Right Brachial SBP: Not acquired Left Brachial SBP: Not acquired RIGHT CAROTID ARTERY: No significant calcified disease of the right common carotid artery. Intermediate waveform maintained. Heterogeneous plaque without significant calcifications at the right carotid bifurcation. Low resistance waveform of the right ICA. No significant tortuosity. RIGHT VERTEBRAL ARTERY: Antegrade flow with low resistance waveform. LEFT CAROTID ARTERY: No significant calcified disease of the left common carotid artery. Intermediate waveform maintained. Heterogeneous plaque at the left carotid bifurcation without significant calcifications. Low resistance waveform of the left ICA. LEFT VERTEBRAL ARTERY:  Antegrade flow with low resistance waveform. Additional: Right thyroid nodule measures 1.8 cm. IMPRESSION: Color duplex indicates minimal heterogeneous plaque, with no hemodynamically significant stenosis by duplex criteria in the extracranial cerebrovascular circulation. Incidental finding of right thyroid nodule measuring 1.8 cm. Dedicated thyroid ultrasound recommended  if further characterisation warranted. Signed, Dulcy Fanny. Dellia Nims, RPVI [facility] Electronically Signed   By: Corrie Mckusick D.O.   On: 03/11/2018 09:41   Mr Jodene Nam Head/brain SA Cm  Result Date: 03/11/2018 CLINICAL DATA:  Headache, new, malignancy suspected. Dizziness and weakness for 5 days EXAM: MRI HEAD WITHOUT CONTRAST MRA HEAD WITHOUT CONTRAST TECHNIQUE: Multiplanar, multiecho pulse sequences of the brain and surrounding structures were obtained without intravenous contrast. Angiographic images of the head were obtained using MRA technique without contrast. COMPARISON:  None. FINDINGS: MRI HEAD FINDINGS Brain: No acute infarction, hemorrhage, hydrocephalus, extra-axial collection or mass lesion. Mild patchy FLAIR hyperintensity in the pons. Even milder periventricular FLAIR hyperintensity. Normal brain volume. Partially empty sella considered incidental in isolation. Vascular: Arterial findings below. Normal dural venous sinus flow voids. Skull and upper cervical spine: No evident marrow lesion Sinuses/Orbits: Negative MRA HEAD FINDINGS Left larger than right ICA in the  setting of right A1 hypoplasia. There is a lobulated superiorly directed anterior communicating artery aneurysm measuring 5 mm in width and 3 mm base to dome. No major branch occlusion or flow limiting stenosis. Negative for vessel beading. IMPRESSION: Brain MRI: 1. No acute finding or specific explanation for headache. 2. Mild signal abnormality in the pons and periventricular white matter that is usually from chronic small vessel ischemia. Intracranial MRA: 1. 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 2. No emergent finding. Electronically Signed   By: Monte Fantasia M.D.   On: 03/11/2018 09:08   Mr Brain Wo Contrast  Result Date: 04/03/2018 CLINICAL DATA:  Stroke follow-up EXAM: MRI HEAD WITHOUT CONTRAST TECHNIQUE: Multiplanar, multiecho pulse sequences of the brain and  surrounding structures were obtained without intravenous contrast. COMPARISON:  Brain MRI 03/31/2018 and head CT 04/03/2018 FINDINGS: BRAIN: Compared to the prior MRI of 03/18/2018 there has been progression of bilateral watershed distribution infarcts, left-greater-than-right. The amount of ischemia is worst in the left frontal lobe. There is no midline shift or other mass effect. Partially empty sella. There is cytotoxic edema greatest in the superior anterior frontal lobes. Multifocal white matter hyperintensity, most commonly due to chronic ischemic microangiopathy. The cerebral and cerebellar volume are age-appropriate. Susceptibility-sensitive sequences show no chronic microhemorrhage or superficial siderosis. VASCULAR: Major intracranial arterial and venous sinus flow voids are normal. SKULL AND UPPER CERVICAL SPINE: Calvarial bone marrow signal is normal. There is no skull base mass. Visualized upper cervical spine and soft tissues are normal. SINUSES/ORBITS: No fluid levels or advanced mucosal thickening. No mastoid or middle ear effusion. The orbits are normal. IMPRESSION: 1. Worsening bilateral watershed distribution infarcts compared to MRI of 03/09/2018. Compared to the CT from earlier today, the distribution appears unchanged, allowing for differences in modality. 2. No hemorrhage or mass effect. Electronically Signed   By: Ulyses Jarred M.D.   On: 04/03/2018 20:33    PHYSICAL EXAM  Temp:  [99.1 F (37.3 C)-101 F (38.3 C)] 101 F (38.3 C) (02/04 1600) Pulse Rate:  [97-140] 111 (02/04 1547) Resp:  [15-35] 20 (02/04 1547) BP: (106-148)/(63-107) 125/83 (02/04 1547) SpO2:  [95 %-100 %] 97 % (02/04 1547) FiO2 (%):  [30 %] 30 % (02/04 1547) Weight:  [120.5 kg] 120.5 kg (02/04 0426)  General - Well nourished, well developed, intubated on precedex.  Ophthalmologic - fundi not visualized due to noncooperation.  Cardiovascular - Regular rate and rhythm.  Neurological Exam :  - intubated on  precedex, eyes open with voice and tonic upgaze deviation, but not following commands. Intermittent chewing movements.Eyes conjugate right gaze preference, not blinking to visual threat bilaterally, but blinking spontaneously, doll's eyes present, not tracking, PERRL. Oral involuntary movement with chewing. Corneal reflex present bilaterally, gag and cough present. Breathing over the vent.  Facial symmetry not able to test due to ET tube.  Tongue midline in mouth. On pain stimulation, no withdraw in extremities but brisk babinski.  Increased muscle tone right arm and leg> left arm and leg also axial increased tone,. DTR 1+ and brisk positive bilateral babinski. Sensation, coordination and gait not tested.   ASSESSMENT/PLAN Ms. Rebecca Cox is a 58 y.o. female with history of CKD, CAD, OSA, liver tumor, cerebral aneurysm admitted for right arm and left leg weakness found after aneurysm coiling. No tPA given due to right groin severe bleeding.    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  MRI - bilateral ACA and MCA/ACA watershed scattered infarcts  MRA - right ACA stenting  CT 04/02/2018 no bleeding, involving lateral ACA and MCA/ACA infarcts  MRI repeat 04/03/2018 showed extensive bilateral ACA and MCA/ACA infarcts  Carotid Doppler unremarkable  2D Echo EF 60 to 65%  LDL 96  HgbA1c 4.5  Heparin subcu for VTE prophylaxis  aspirin 325 mg daily and clopidogrel 75 mg daily prior to admission, now on aspirin 81 mg daily and Brilinta.  Ongoing aggressive stroke risk factor management  Therapy recommendations: Pending  Disposition: Pending, a long discussion with husband at bedside regarding [place] and DNR.  Husband still needs time to think about it.  He declined palliative consult.  CODE STATUS full code at this time  Vegetative state with increased muscle tone bilaterally  Patient developed diffuse increased muscle tone in all extremities and axially  MRI showed bilateral ACA and MCA/ACA infarcts  MRI and repeat extensive bilateral ACA and MCA/ACA infarcts, progressed from prior MRI  EEG no seizure  Resume home Wellbutrin and Effexor as well as Topamax  Resume home Klonopin 0.5 mg twice daily  Supportive care   Hypotension   BP 110s  Now off neo   BP goal > 110  Fever   Tmax 100.9  CXR - Mild bilateral multifocal infiltrates  UA  - unremarkable   Blood culture -  Strep anginosis  CCM on board  Sputum culture - Gram positive cocci - Gram positive rods - Gram negative rods - few ecoli in trache aspirate  AKI   Creatinine 1.04-1.05-1.78-0.97-0.85-0.9  On IVF   oligouria resolved  CCM on board  Close monitoring  ACOM aneurysm  Status post ACOM stent placement  On aspirin and Brilinta  Heparin IV discontinued  Dr.Deveshwar following  Right groin hematoma s/p right deep femoral A repair  Status post cerebral angiogram  VVS on board  P drainage off - on wound vac  Close vascular check  Continue aspirin and Brilinta  Severe anemia due to blood loss  Hemoglobin down from 12.8->6.1, received 4 units PRBC  hemoglobin 7.6 04/07/2018 - PRBC  Stool occult blood Negative   Consider CT abdomen to rule out peritoneal hemorrhage if needed. Appreciate CCM following.   Hyperlipidemia  Home meds:  Zocor 20   LDL 96, goal < 70  Now on Zocor 40  Continue statin at discharge  Other Stroke Risk Factors  Morbid Obesity, Body mass index is 44.21 kg/m.   Obstructive sleep apnea, on CPAP at home  Other Active Problems  Leukocytosis 16.1-15.6-19.0-7.5-6.5 -> 7.3 -> 9.4  Hospital day # 8  This patient is critically ill due to bilateral ACA, MCA/ACA territory infarcts, severe anemia  requiring blood transfusion, groin hematoma with femoral artery repair, hypotension, leukocytosis and at significant risk of neurological worsening, death form recurrent stroke, hemorrhagic conversion, seizure, hypovolemic shock, sepsis.   This patient is critically ill and at significant risk of neurological worsening, death and care requires constant monitoring of vital signs, hemodynamics,respiratory and cardiac monitoring,review of multiple databases, neurological assessment, discussion with family, other specialists and medical decision making of high complexity.I  I spent 45 minutes of neurocritical care time in the care of this patient.  Patient's prognosis is very poor and I   discussed this with her husband and brother over phone and answered questions. D/w Dr Lynetta Mare and  Dr. Estanislado Pandy  Family agrees to DNR and will decide on withdrawal of care soon. Antony Contras, MD Medical Director [place] Pager: [phone] 04/09/2018 4:07 PM  To contact Stroke Continuity provider, please refer to [email or web address] After hours, contact General Neurology

## 2018-04-10 LAB — GLUCOSE, CAPILLARY
GLUCOSE-CAPILLARY: 103 mg/dL — AB (ref 70–99)
Glucose-Capillary: 113 mg/dL — ABNORMAL HIGH (ref 70–99)
Glucose-Capillary: 99 mg/dL (ref 70–99)
Glucose-Capillary: 110 mg/dL — ABNORMAL HIGH (ref 70–99)

## 2018-04-10 MED ORDER — MORPHINE SULFATE (PF) 2 MG/ML IV SOLN: 2.0000 mg | INTRAVENOUS | Status: DC | PRN

## 2018-04-10 MED ORDER — MORPHINE 100MG IN NS 100ML (1MG/ML) PREMIX INFUSION
0.0000 mg/h | INTRAVENOUS | Status: DC
  Administered 2018-04-10: 5 mg/h via INTRAVENOUS
  Administered 2018-04-10: 20 mg/h via INTRAVENOUS
  Filled 2018-04-10 (×2): qty 100

## 2018-04-10 MED ORDER — GLYCOPYRROLATE 1 MG PO TABS
1.0000 mg | ORAL_TABLET | ORAL | Status: DC | PRN
  Filled 2018-04-10: qty 1

## 2018-04-10 MED ORDER — POLYVINYL ALCOHOL 1.4 % OP SOLN
1.0000 [drp] | Freq: Four times a day (QID) | OPHTHALMIC | Status: DC | PRN
  Filled 2018-04-10: qty 15

## 2018-04-10 MED ORDER — DEXTROSE 5 % IV SOLN
INTRAVENOUS | Status: DC
  Administered 2018-04-10: 17:00:00 via INTRAVENOUS

## 2018-04-10 MED ORDER — GLYCOPYRROLATE 0.2 MG/ML IJ SOLN
0.2000 mg | INTRAMUSCULAR | Status: DC | PRN
  Administered 2018-04-10: 0.2 mg via INTRAVENOUS
  Filled 2018-04-10: qty 1

## 2018-04-10 MED ORDER — DIPHENHYDRAMINE HCL 50 MG/ML IJ SOLN: 25.0000 mg | INTRAMUSCULAR | Status: DC | PRN

## 2018-04-10 MED ORDER — MORPHINE BOLUS VIA INFUSION
5.0000 mg | INTRAVENOUS | Status: DC | PRN
  Administered 2018-04-10 (×6): 5 mg via INTRAVENOUS
  Filled 2018-04-10: qty 5

## 2018-04-10 MED ORDER — GLYCOPYRROLATE 0.2 MG/ML IJ SOLN: 0.2000 mg | INTRAMUSCULAR | Status: DC | PRN

## 2018-04-10 MED ORDER — LORAZEPAM 2 MG/ML IJ SOLN
2.0000 mg | INTRAMUSCULAR | Status: DC | PRN
  Administered 2018-04-10: 4 mg via INTRAVENOUS
  Filled 2018-04-10: qty 2

## 2018-05-05 NOTE — Progress Notes (Signed)
Patient had 2x episodes of increased chewing on her ET tube and alarming the ventilator. She seems to be attempting to talk over the ventilator and myself & another nurse heard her say "help me".  PRN versed given, patient resting more comfortably now.   Will continue to monitor.   Leticia Clas, RN BSN

## 2018-05-05 NOTE — Progress Notes (Signed)
STROKE TEAM PROGRESS NOTE   SUBJECTIVE (INTERVAL HISTORY).   Patient apparently was trying to complicated with the nurse last night and followed a few commands but to my exam today remains vegetative with tonic upgaze and constant chewing movements and does not follow any commands for me. She does withdraw partial in the lower extremities to noxious stimuli. I met with the patient's husband this afternoon and had a long conversation with him and he is quite clear that patient would not have wanted prolonged ventilatory support and nursing home care and wants to pursue full comfort care and withdrawal of ventilatory support    OBJECTIVE Temp:  [99.6 F (37.6 C)-101.2 F (38.4 C)] 100.7 F (38.2 C) (02/05 1248) Pulse Rate:  [51-130] 122 (02/05 1546) Cardiac Rhythm: Sinus tachycardia (02/05 0800) Resp:  [10-29] 22 (02/05 1546) BP: (104-149)/(57-113) 149/84 (02/05 1546) SpO2:  [95 %-100 %] 100 % (02/05 1546) FiO2 (%):  [30 %] 30 % (02/05 1546) Weight:  [121 kg] 121 kg (02/05 0208)  Recent Labs  Lab 04/09/18 2312 2018/04/20 0317 April 20, 2018 0727 April 20, 2018 1144 04/20/18 1534  GLUCAP 109* 113* 99 110* 103*   Recent Labs  Lab 04/03/18 1637 04/04/18 0432  04/04/18 1045 04/04/18 1748 04/05/18 0553 04/05/18 1818 04/06/18 0500 04/07/18 0556 04/08/18 0103  NA  --   --   --  139  --  140  --  138 141 142  K  --   --   --  3.3*  --  3.1*  --  3.5 3.6 4.1  CL  --   --   --  117*  --  113*  --  111 116*  --   CO2  --   --   --  17*  --  22  --  19* 19*  --   GLUCOSE  --   --   --  156*  --  145*  --  127* 116*  --   BUN  --   --   --  28*  --  25*  --  24* 24*  --   CREATININE  --   --   --  0.97  --  0.85  --  0.85 0.90  --   CALCIUM  --   --    < > 8.6*  --  8.6*  --  8.7* 8.2*  --   MG 1.5* 1.9  --   --  1.7 1.6* 1.9  --   --   --   PHOS 1.9* 1.8*  --   --  1.6* 1.7* 2.3*  --   --   --    < > = values in this interval not displayed.   No results for input(s): AST, ALT, ALKPHOS, BILITOT,  PROT, ALBUMIN in the last 168 hours. Recent Labs  Lab 04/05/18 0553  04/05/18 1818 04/06/18 0500 04/07/18 0556 04/08/18 0103 04/08/18 0555  WBC 6.5  --  7.3 9.4 13.4*  --  19.3*  NEUTROABS  --   --   --   --   --   --  17.3*  HGB 7.0*   < > 8.0* 8.0* 7.6* 7.1* 7.5*  HCT 21.8*   < > 24.5* 24.1* 23.9* 21.0* 23.8*  MCV 87.9  --  86.9 88.3 89.8  --  91.2  PLT 89*  --  94* 105* 121*  --  165   < > = values in this interval not displayed.   No results for  input(s): CKTOTAL, CKMB, CKMBINDEX, TROPONINI in the last 168 hours. No results for input(s): LABPROT, INR in the last 72 hours. No results for input(s): COLORURINE, LABSPEC, Accord, GLUCOSEU, HGBUR, BILIRUBINUR, KETONESUR, PROTEINUR, UROBILINOGEN, NITRITE, LEUKOCYTESUR in the last 72 hours.  Invalid input(s): APPERANCEUR     Component Value Date/Time   CHOL 175 03/11/2018 0624   TRIG 155 (H) 03/11/2018 0624   HDL 48 03/11/2018 0624   CHOLHDL 3.6 03/11/2018 0624   VLDL 31 03/11/2018 0624   LDLCALC 96 03/11/2018 0624   Lab Results  Component Value Date   HGBA1C 4.7 (L) 03/11/2018      Component Value Date/Time   LABOPIA NONE DETECTED 03/10/2018 1210   COCAINSCRNUR NONE DETECTED 03/10/2018 1210   LABBENZ POSITIVE (A) 03/10/2018 1210   AMPHETMU NONE DETECTED 03/10/2018 1210   THCU NONE DETECTED 03/10/2018 1210   LABBARB NONE DETECTED 03/10/2018 1210    No results for input(s): ETH in the last 168 hours.  I have personally reviewed the radiological images below and agree with the radiology interpretations.  Ct Head Wo Contrast  Result Date: 04/02/2018 CLINICAL DATA:  Stroke, follow-up. EXAM: CT HEAD WITHOUT CONTRAST TECHNIQUE: Contiguous axial images were obtained from the base of the skull through the vertex without intravenous contrast. COMPARISON:  MRI brain 03/11/2018. FINDINGS: Brain: Bilateral watershed territory infarcts are confirmed as hypoattenuation involving the cortex, left greater than right. There is no  associated hemorrhage. Left ACA stent is stable in position. There is no hemorrhage or new infarct. Ventricles are of normal size. Insert pass fluid Vascular: ACA stent is in place. No significant atherosclerotic calcifications are present. There is no hyperdense vessel. Skull: Calvarium is intact. No focal lytic or blastic lesions are present. Sinuses/Orbits: The paranasal sinuses and mastoid air cells are clear. The globes and orbits are within normal limits. IMPRESSION: 1. Expected progression of bilateral watershed territory infarcts, left greater than right. 2. No new infarct territory or hemorrhage. 3. ACA stent. Electronically Signed   By: San Morelle M.D.   On: 04/02/2018 09:51   Ct Head Wo Contrast  Result Date: 03/10/2018 CLINICAL DATA:  Acute onset dizziness and difficulty walking today. EXAM: CT HEAD WITHOUT CONTRAST TECHNIQUE: Contiguous axial images were obtained from the base of the skull through the vertex without intravenous contrast. COMPARISON:  None. FINDINGS: Brain: No evidence of acute infarction, hemorrhage, hydrocephalus, extra-axial collection or mass lesion/mass effect. Vascular: No hyperdense vessel or unexpected calcification. Skull: Normal. Negative for fracture or focal lesion. Sinuses/Orbits: Negative. Other: None. IMPRESSION: Negative head CT. Electronically Signed   By: Inge Rise M.D.   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 Acute onset of right arm and leg weakness. EXAM: MRI HEAD WITHOUT CONTRAST MRA HEAD WITHOUT CONTRAST TECHNIQUE: Multiplanar, multiecho pulse sequences of the brain and surrounding structures were obtained without intravenous contrast. Angiographic images of the head were obtained using MRA technique without contrast. COMPARISON:  Angiographic images same day.  MRI 03/12/2018. FINDINGS: MRI HEAD FINDINGS Brain: The  brain shows bilateral watershed distribution cortical and subcortical edema and restricted diffusion, affecting both hemispheres but more extensive on the left than the right. The question is if this represents true bilateral watershed infarction or if it represents a vaso regulatory disturbance related to spasm. No specific vascular territory infarction is seen. No sign of hemorrhage. Chronic small-vessel ischemic changes affect the brainstem and the cerebral  hemispheric white matter as seen previously. No hydrocephalus. No extra-axial collection. Vascular: Major vessels at the base of the brain show flow. 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 There does appear to be a spasm appearance of the A1 vessel on the left and both anterior cerebral arteries, more extensively the right than the left. 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 No middle cerebral artery abnormality is seen. Posterior circulation vessels continued to show a normal appearance. IMPRESSION: Bilateral watershed distribution cortical and subcortical edema and restricted diffusion, more extensive on the left than the right. Differential diagnosis is true watershed infarction versus vaso regulatory disturbance secondary to arterial spasm, which is demonstrated in the anterior cerebral vessels by MR angiography. 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 Electronically Signed   By: Nelson Chimes M.D.   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 Acute onset of right arm and leg weakness. EXAM: MRI HEAD WITHOUT CONTRAST MRA HEAD WITHOUT CONTRAST TECHNIQUE: Multiplanar, multiecho pulse sequences of the brain and surrounding  structures were obtained without intravenous contrast. Angiographic images of the head were obtained using MRA technique without contrast. COMPARISON:  Angiographic images same day.  MRI 03/12/2018. FINDINGS: MRI HEAD FINDINGS Brain: The brain shows bilateral watershed distribution cortical and subcortical edema and restricted diffusion, affecting both hemispheres but more extensive on the left than the right. The question is if this represents true bilateral watershed infarction or if it represents a vaso regulatory disturbance related to spasm. No specific vascular territory infarction is seen. No sign of hemorrhage. Chronic small-vessel ischemic changes affect the brainstem and the cerebral hemispheric white matter as seen previously. No hydrocephalus. No extra-axial collection. Vascular: Major vessels at the base of the brain show flow. 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 There does appear to be a spasm appearance of the A1 vessel on the left and both anterior cerebral arteries, more extensively the right than the left. 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 No middle cerebral artery abnormality is seen. Posterior circulation vessels continued to show a normal appearance. IMPRESSION: Bilateral watershed distribution cortical and subcortical edema and restricted diffusion, more extensive on the left than the right. Differential diagnosis is true watershed infarction versus vaso regulatory disturbance secondary to arterial spasm, which is demonstrated in the anterior cerebral vessels by MR angiography. 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 Electronically Signed   By: Nelson Chimes M.D.   On: 03/15/2018 16:02   Mr Brain Wo Contrast  Result Date: 03/11/2018 CLINICAL DATA:  Headache, new,  malignancy suspected. Dizziness and weakness for 5 days EXAM: MRI HEAD WITHOUT CONTRAST MRA HEAD WITHOUT CONTRAST TECHNIQUE: Multiplanar, multiecho pulse sequences of the brain and surrounding structures were obtained without intravenous contrast. Angiographic images of the head were obtained using MRA technique without contrast. COMPARISON:  None. FINDINGS: MRI HEAD FINDINGS Brain: No acute infarction, hemorrhage, hydrocephalus, extra-axial collection or mass lesion. Mild patchy FLAIR hyperintensity in the pons. Even milder periventricular FLAIR hyperintensity. Normal brain volume. Partially empty sella considered incidental in isolation. Vascular: Arterial findings below. Normal dural venous sinus  flow voids. 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 No major branch occlusion or flow limiting stenosis. Negative for vessel beading. IMPRESSION: Brain MRI: 1. No acute finding or specific explanation for headache. 2. Mild signal abnormality in the pons and periventricular white matter that is usually from chronic small vessel ischemia. Intracranial MRA: 1. 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 2. No emergent finding. Electronically Signed   By: Monte Fantasia M.D.   On: 03/11/2018 09:08   Mr Brain W Contrast  Result Date: 03/12/2018 CLINICAL DATA:  Liver cancer. Headache. Rule out metastatic disease. EXAM: MRI HEAD WITH CONTRAST TECHNIQUE: Multiplanar, multiecho pulse sequences of the brain and surrounding structures were obtained with intravenous contrast. CONTRAST:  10 mL Gadovist IV COMPARISON:  Unenhanced MRI head 03/11/2018 FINDINGS: Normal enhancement. No enhancing mass lesion. Leptomeningeal enhancement normal. No acute abnormality. See recent  unenhanced MRI report. IMPRESSION: No acute abnormality and negative for intracranial metastatic disease. Electronically Signed   By: Franchot Gallo M.D.   On: 03/12/2018 11:16   US Carotid Bilateral (at [facility] And [facility] Only)  Result Date: 03/11/2018 CLINICAL DATA:  58 year old female with history of vertigo EXAM: BILATERAL CAROTID DUPLEX ULTRASOUND TECHNIQUE: Pearline Cables scale imaging, color Doppler and duplex ultrasound were performed of bilateral carotid and vertebral arteries in the neck. COMPARISON:  None. FINDINGS: Criteria: Quantification of carotid stenosis is based on velocity parameters that correlate the residual internal carotid diameter with NASCET-based stenosis levels, using the diameter of the distal internal carotid lumen as the denominator for stenosis measurement. The following velocity measurements were obtained: RIGHT ICA:  Systolic 91 cm/sec, Diastolic 34 cm/sec CCA:  578 cm/sec SYSTOLIC ICA/CCA RATIO:  0.9 ECA:  96 cm/sec LEFT ICA:  Systolic 78 cm/sec, Diastolic 38 cm/sec CCA:  93 cm/sec SYSTOLIC ICA/CCA RATIO:  0.8 ECA:  77 cm/sec Right Brachial SBP: Not acquired Left Brachial SBP: Not acquired RIGHT CAROTID ARTERY: No significant calcified disease of the right common carotid artery. Intermediate waveform maintained. Heterogeneous plaque without significant calcifications at the right carotid bifurcation. Low resistance waveform of the right ICA. No significant tortuosity. RIGHT VERTEBRAL ARTERY: Antegrade flow with low resistance waveform. LEFT CAROTID ARTERY: No significant calcified disease of the left common carotid artery. Intermediate waveform maintained. Heterogeneous plaque at the left carotid bifurcation without significant calcifications. Low resistance waveform of the left ICA. LEFT VERTEBRAL ARTERY:  Antegrade flow with low resistance waveform. Additional: Right thyroid nodule measures 1.8 cm. IMPRESSION: Color duplex indicates minimal heterogeneous plaque, with no hemodynamically  significant stenosis by duplex criteria in the extracranial cerebrovascular circulation. Incidental finding of right thyroid nodule measuring 1.8 cm. Dedicated thyroid ultrasound recommended if further characterisation warranted. Signed, Dulcy Fanny. Dellia Nims, RPVI [facility] Electronically Signed   By: Corrie Mckusick D.O.   On: 03/11/2018 09:41   Mr Jodene Nam Head/brain IO Cm  Result Date: 03/11/2018 CLINICAL DATA:  Headache, new, malignancy suspected. Dizziness and weakness for 5 days EXAM: MRI HEAD WITHOUT CONTRAST MRA HEAD WITHOUT CONTRAST TECHNIQUE: Multiplanar, multiecho pulse sequences of the brain and surrounding structures were obtained without intravenous contrast. Angiographic images of the head were obtained using MRA technique without contrast. COMPARISON:  None. FINDINGS: MRI HEAD FINDINGS Brain: No acute infarction, hemorrhage, hydrocephalus, extra-axial  collection or mass lesion. Mild patchy FLAIR hyperintensity in the pons. Even milder periventricular FLAIR hyperintensity. Normal brain volume. Partially empty sella considered incidental in isolation. Vascular: Arterial findings below. Normal dural venous sinus flow voids. 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 No major branch occlusion or flow limiting stenosis. Negative for vessel beading. IMPRESSION: Brain MRI: 1. No acute finding or specific explanation for headache. 2. Mild signal abnormality in the pons and periventricular white matter that is usually from chronic small vessel ischemia. Intracranial MRA: 1. 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 2. No emergent finding. Electronically Signed   By: Monte Fantasia M.D.   On:  03/11/2018 09:08   Mr Brain Wo Contrast  Result Date: 04/03/2018 CLINICAL DATA:  Stroke follow-up EXAM: MRI HEAD WITHOUT CONTRAST TECHNIQUE: Multiplanar, multiecho pulse sequences of the brain and surrounding structures were obtained without intravenous contrast. COMPARISON:  Brain MRI 03/28/2018 and head CT 04/03/2018 FINDINGS: BRAIN: Compared to the prior MRI of 03/15/2018 there has been progression of bilateral watershed distribution infarcts, left-greater-than-right. The amount of ischemia is worst in the left frontal lobe. There is no midline shift or other mass effect. Partially empty sella. There is cytotoxic edema greatest in the superior anterior frontal lobes. Multifocal white matter hyperintensity, most commonly due to chronic ischemic microangiopathy. The cerebral and cerebellar volume are age-appropriate. Susceptibility-sensitive sequences show no chronic microhemorrhage or superficial siderosis. VASCULAR: Major intracranial arterial and venous sinus flow voids are normal. SKULL AND UPPER CERVICAL SPINE: Calvarial bone marrow signal is normal. There is no skull base mass. Visualized upper cervical spine and soft tissues are normal. SINUSES/ORBITS: No fluid levels or advanced mucosal thickening. No mastoid or middle ear effusion. The orbits are normal. IMPRESSION: 1. Worsening bilateral watershed distribution infarcts compared to MRI of 03/15/2018. Compared to the CT from earlier today, the distribution appears unchanged, allowing for differences in modality. 2. No hemorrhage or mass effect. Electronically Signed   By: Ulyses Jarred M.D.   On: 04/03/2018 20:33    PHYSICAL EXAM  Temp:  [99.6 F (37.6 C)-101.2 F (38.4 C)] 100.7 F (38.2 C) (02/05 1248) Pulse Rate:  [51-130] 122 (02/05 1546) Resp:  [10-29] 22 (02/05 1546) BP: (104-149)/(57-113) 149/84 (02/05 1546) SpO2:  [95 %-100 %] 100 % (02/05 1546) FiO2 (%):  [30 %] 30 % (02/05 1546) Weight:  [644 kg] 121 kg (02/05 0208)  General -  Well nourished, well developed, intubated on precedex.  Ophthalmologic - fundi not visualized due to noncooperation.  Cardiovascular - Regular rate and rhythm.  Neurological Exam :  - intubated  , eyes open with voice and tonic upgaze deviation, but not following commands. Intermittent chewing movements.Eyes conjugate right gaze preference, not blinking to visual threat bilaterally, but blinking spontaneously, doll's eyes present, not tracking, PERRL. Oral involuntary movement with chewing. Corneal reflex present bilaterally, gag and cough present. Breathing over the vent.  Facial symmetry not able to test due to ET tube.  Tongue midline in mouth. On pain stimulation, no withdraw in extremities but brisk babinski.  Increased muscle tone right arm and leg> left arm and leg also axial increased tone,. DTR 1+ and brisk positive bilateral babinski. Sensation, coordination and gait not tested.   ASSESSMENT/PLAN Ms. Rebecca Cox is a 59  y.o. female with history of CKD, CAD, OSA, liver tumor, cerebral aneurysm admitted for right arm and left leg weakness found after aneurysm coiling. No tPA given due to right groin severe bleeding.    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  MRI - bilateral ACA and MCA/ACA watershed scattered infarcts  MRA - right ACA stenting  CT 04/02/2018 no bleeding, involving lateral ACA and MCA/ACA infarcts  MRI repeat 04/03/2018 showed extensive bilateral ACA and MCA/ACA infarcts  Carotid Doppler unremarkable  2D Echo EF 60 to 65%  LDL 96  HgbA1c 4.5  Heparin subcu for VTE prophylaxis  aspirin 325 mg daily and clopidogrel 75 mg daily prior to admission, now on aspirin 81 mg daily and Brilinta.  Ongoing aggressive stroke  risk factor management  Therapy recommendations: Pending  Disposition: Pending, a long discussion with husband at bedside regarding [facility] and DNR.  Husband still needs time to think about it. He declined palliative consult.  CODE STATUS full code at this time  Vegetative state with increased muscle tone bilaterally  Patient developed diffuse increased muscle tone in all extremities and axially  MRI showed bilateral ACA and MCA/ACA infarcts  MRI and repeat extensive bilateral ACA and MCA/ACA infarcts, progressed from prior MRI  EEG no seizure  Resume home Wellbutrin and Effexor as well as Topamax  Resume home Klonopin 0.5 mg twice daily  Supportive care   Hypotension   BP 110s  Now off neo   BP goal > 110  Fever   Tmax 100.9  CXR - Mild bilateral multifocal infiltrates  UA  - unremarkable   Blood culture -  Strep anginosis  CCM on board  Sputum culture - Gram positive cocci - Gram positive rods - Gram negative rods - few ecoli in trache aspirate  AKI   Creatinine 1.04-1.05-1.78-0.97-0.85-0.9  On IVF   oligouria resolved  CCM on board  Close monitoring  ACOM aneurysm  Status post ACOM stent placement  On aspirin and Brilinta  Heparin IV discontinued  Dr.Deveshwar following  Right groin hematoma s/p right deep femoral A repair  Status post cerebral angiogram  VVS on board  P drainage off - on wound vac  Close vascular check  Continue aspirin and Brilinta  Severe anemia due to blood loss  Hemoglobin down from 12.8->6.1, received 4 units PRBC  hemoglobin 7.6 04/07/2018 - PRBC  Stool occult blood Negative   Consider CT abdomen to rule out peritoneal hemorrhage if needed. Appreciate CCM following.   Hyperlipidemia  Home meds:  Zocor 20   LDL 96, goal < 70  Now on Zocor 40  Continue statin at discharge  Other Stroke Risk Factors  Morbid Obesity, Body mass index is 44.39 kg/m.   Obstructive sleep apnea, on CPAP at  home  Other Active Problems  Leukocytosis 16.1-15.6-19.0-7.5-6.5 -> 7.3 -> 9.4  Hospital day # 9  This patient is critically ill due to bilateral ACA, MCA/ACA territory infarcts, severe anemia requiring blood transfusion, groin hematoma with femoral artery repair, hypotension, leukocytosis and at significant risk of neurological worsening, death form recurrent stroke, hemorrhagic conversion, seizure, hypovolemic shock, sepsis.   This patient is critically ill and at significant risk of neurological worsening, death and care requires constant monitoring of vital signs, hemodynamics,respiratory and cardiac monitoring,review of multiple  databases, neurological assessment, discussion with family, other specialists and medical decision making of high complexity.I  I spent 35 minutes of neurocritical care time in the care of this patient.  Patient's prognosis is very poor and I   discussed this with her husband and brother over phone and answered questions. D/w Dr Lynetta Mare and  Dr. Estanislado Pandy her husband agrees to DNR and   withdrawal of care today and will make patient full comfort care Antony Contras, MD Medical Director [facility] Pager: [phone] 2018/04/18 3:59 PM  To contact Stroke Continuity provider, please refer to [email or web address] After hours, contact General Neurology

## 2018-05-05 NOTE — Progress Notes (Signed)
Wasted 69mL Morphine with Matilde Haymaker, RN in the sink.  Leticia Clas, RN BSN

## 2018-05-05 NOTE — Progress Notes (Signed)
NAME:  Rebecca Cox, MRN:  [id number], DOB:  [birth year], LOS: 9 ADMISSION DATE:  03/27/2018, CONSULTATION DATE:  03/22/2018 REFERRING MD:  Arlean Hopping, CHIEF COMPLAINT:  Post procedural CVA   HPI/course in hospital  This is a 58 year old female with history of liver cancer and cerebral aneurysm who presents to [facility] for vent management postop.  Patient have the aneurysm coiled and during the procedure developed a bifrontal CVA and a right groin hematoma.  She returned to the OR for evacuation of the hematoma and repair of the vessel.  Heparin was reversed and patient was transfused 2 units of packed red blood cells.  MRI 1/27 - extensive watershed watershed infarcts.  Past Medical History   Past Medical History:  Diagnosis Date  . Agoraphobia   . Arthritis   . Cancer ([place])    liver  . Cerebral aneurysm   . Chronic kidney disease    stage 3  . Coronary artery disease   . Depression   . Diverticulitis   . Esophageal hernia   . Family history of adverse reaction to anesthesia    Pt suspects that " mother had cardiac arrest from anesthesia- not exactly sure"   . GERD (gastroesophageal reflux disease)   . Headache   . Sleep apnea    does not wear CPAP  . Thyroid nodule   . Wears glasses   . Wears partial dentures      Past Surgical History:  Procedure Laterality Date  . APPLICATION OF WOUND VAC Right 03/10/2018   Procedure: APPLICATION OF WOUND VAC;  Surgeon: Angelia Mould, MD;  Location: [facility];  Service: Vascular;  Laterality: Right;  . BARIATRIC SURGERY    . CARPAL TUNNEL RELEASE    . COLONOSCOPY W/ BIOPSIES AND POLYPECTOMY    . HEMATOMA EVACUATION Right 03/31/2018   Procedure: EVACUATION HEMATOMA RIGHT GROIN;  Surgeon: Angelia Mould, MD;  Location: [facility] OR;  Service: Vascular;  Laterality: Right;  . IR ANGIO INTRA EXTRACRAN SEL COM CAROTID INNOMINATE UNI R MOD SED  03/31/2018  . IR ANGIO INTRA EXTRACRAN SEL INTERNAL CAROTID UNI L MOD SED  03/08/2018  . IR ANGIO  VERTEBRAL SEL VERTEBRAL BILAT MOD SED  03/08/2018  . IR NEURO EACH ADD'L AFTER BASIC UNI LEFT (MS)  03/27/2018  . IR TRANSCATH/EMBOLIZ  04/04/2018  . MULTIPLE TOOTH EXTRACTIONS    . RADIOLOGY WITH ANESTHESIA N/A 03/27/2018   Procedure: EMBOLIZATION;  Surgeon: Luanne Bras, MD;  Location: [facility];  Service: Radiology;  Laterality: N/A;  . THROMBECTOMY FEMORAL ARTERY Right 03/08/2018   Procedure: DEEP FEMORAL ARTERY REPAIR;  Surgeon: Angelia Mould, MD;  Location: [facility];  Service: Vascular;  Laterality: Right;  . TONSILLECTOMY      Interim history/subjective:  EEG yesterday showed no seizure activity associated with facial automatisms.  More awake today and followed commands for RN.  Objective   Blood pressure 110/71, pulse 94, temperature 100 F (37.8 C), temperature source Axillary, resp. rate 19, height 5\' 5"  (1.651 m), weight 121 kg, SpO2 98 %.    Vent Mode: PCV FiO2 (%):  [30 %] 30 % Set Rate:  [20 bmp] 20 bmp Vt Set:  [450 mL] 450 mL PEEP:  [8 cmH20] 8 cmH20 Pressure Support:  [12 cmH20] 12 cmH20 Plateau Pressure:  [13 cmH20-25 cmH20] 16 cmH20   Intake/Output Summary (Last 24 hours) at Apr 18, 2018 0804 Last data filed at Apr 18, 2018 0600 Gross per 24 hour  Intake 1943.94 ml  Output 1975 ml  Net -31.06 ml   Filed Weights   04/08/18 0500 04/09/18 0426 04/24/2018 0208  Weight: 119 kg 120.5 kg 121 kg    Examination: Physical Exam  Constitutional: She appears well-developed.  HENT:  ETT and OGT tubes in place with no skin breakdown.  Eyes: Pupils are equal, round, and reactive to light. Conjunctivae are normal. No scleral icterus.  More spontaneous blinking. Still right upward gaze preference but some more eye movement.   Cardiovascular: Normal rate, regular rhythm and normal heart sounds.  Respiratory: Breath sounds normal. No respiratory distress. She has no wheezes. She has no rales.  No dyssynchrony.  Copious secretions but weak cough   GI: Soft. Bowel sounds are  normal.  Neurological: She is unresponsive. GCS eye subscore is 4. GCS verbal subscore is 4. GCS motor subscore is 2.  More alert, but not following commands. Hands in flexion.   Skin: Ecchymosis (at right groin site and on calves bilaterally.) noted.     Ancillary tests (personally reviewed)  CBC: Recent Labs  Lab 04/05/18 0553  04/05/18 1818 04/06/18 0500 04/07/18 0556 04/08/18 0103 04/08/18 0555  WBC 6.5  --  7.3 9.4 13.4*  --  19.3*  NEUTROABS  --   --   --   --   --   --  17.3*  HGB 7.0*   < > 8.0* 8.0* 7.6* 7.1* 7.5*  HCT 21.8*   < > 24.5* 24.1* 23.9* 21.0* 23.8*  MCV 87.9  --  86.9 88.3 89.8  --  91.2  PLT 89*  --  94* 105* 121*  --  165   < > = values in this interval not displayed.    Basic Metabolic Panel: Recent Labs  Lab 04/03/18 1637 04/04/18 0432 04/04/18 1045 04/04/18 1748 04/05/18 0553 04/05/18 1818 04/06/18 0500 04/07/18 0556 04/08/18 0103  NA  --   --  139  --  140  --  138 141 142  K  --   --  3.3*  --  3.1*  --  3.5 3.6 4.1  CL  --   --  117*  --  113*  --  111 116*  --   CO2  --   --  17*  --  22  --  19* 19*  --   GLUCOSE  --   --  156*  --  145*  --  127* 116*  --   BUN  --   --  28*  --  25*  --  24* 24*  --   CREATININE  --   --  0.97  --  0.85  --  0.85 0.90  --   CALCIUM  --   --  8.6*  --  8.6*  --  8.7* 8.2*  --   MG 1.5* 1.9  --  1.7 1.6* 1.9  --   --   --   PHOS 1.9* 1.8*  --  1.6* 1.7* 2.3*  --   --   --    GFR: Estimated Creatinine Clearance: 92.1 mL/min (by C-G formula based on SCr of 0.9 mg/dL). Recent Labs  Lab 04/05/18 1818 04/06/18 0500 04/07/18 0556 04/08/18 0555  WBC 7.3 9.4 13.4* 19.3*    Liver Function Tests: No results for input(s): AST, ALT, ALKPHOS, BILITOT, PROT, ALBUMIN in the last 168 hours. No results for input(s): LIPASE, AMYLASE in the last 168 hours. No results for input(s): AMMONIA in the last 168 hours.  ABG    Component  Value Date/Time   PHART 7.356 04/08/2018 0103   PCO2ART 34.8 04/08/2018 0103    PO2ART 115.0 (H) 04/08/2018 0103   HCO3 19.4 (L) 04/08/2018 0103   TCO2 20 (L) 04/08/2018 0103   ACIDBASEDEF 5.0 (H) 04/08/2018 0103   O2SAT 98.0 04/08/2018 0103     Coagulation Profile: No results for input(s): INR, PROTIME in the last 168 hours.  Cardiac Enzymes: No results for input(s): CKTOTAL, CKMB, CKMBINDEX, TROPONINI in the last 168 hours.  HbA1C: Hgb A1c MFr Bld  Date/Time Value Ref Range Status  03/11/2018 06:22 AM 4.7 (L) 4.8 - 5.6 % Final    Comment:    (NOTE) Pre diabetes:          5.7%-6.4% Diabetes:              >6.4% Glycemic control for   <7.0% adults with diabetes     CBG: Recent Labs  Lab 04/09/18 1525 04/09/18 1920 04/09/18 2312 05-01-2018 0317 05/01/18 0727  GLUCAP 117* 104* 109* 113* 99     Assessment & Plan:   Critically ill due to respiratory failure requiring mechanical ventilation from inability to protect airway. Comatose due to perioperative watershed infarcts  Agitation requiring titration of sedative infusions and enteral sedatives Critical illness associated muscle weakness. 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  Status post repair of groin hematoma  PLAN:  Continue daily SBT, liberation will be dictated by mental status. Will likely require tracheostomy if anticipating prolonged course. More awake today so will continue current sedation strategy and attempt to wean as tolerated. OT consultation for hand and foot splinting. Overall prognosis is poor given extent of neurological injury. Dr Leonie Man to readdress goals of care especially in light of liver cancer.  Continue ceftriaxone for S. Anginosis for optimal CSF penetration - relationship of bacteremia and aneurysm/bleed unclear - treat for 14 days.  Best practice:  Diet: PepUp protocol Pain/Anxiety/Delirium protocol (if indicated): continue fentanyl/dexmedetomidine and enteral sedation to maintain ventilator synchrony. VAP  protocol (if indicated):  Bundle in place DVT prophylaxis: UFH tid GI prophylaxis: Pantoprazole  Glucose control: adequate control without insulin coverage. Mobility:  Bedrest. OT for splinting Code Status: DNR Family Communication: No family updates Disposition: Continued ICU care, ongoing goals of care discussions.   Critical care time: 35 min including chart review, examination of patient and multidisciplinary rounding, monitoring and adjustment of sedatives and ventilator settings.    Kipp Brood, MD [facility] ICU Physician [place]  Pager: [phone] Mobile: [phone] After hours: [phone].  May 01, 2018, 8:04 AM

## 2018-05-05 NOTE — Progress Notes (Addendum)
Nutrition Follow-up  DOCUMENTATION CODES:   Obesity unspecified  INTERVENTION:   -Continue Vital High Protein @ 45 ml/hr (1080 ml) via OGT -30 ml Prostat BID -MVI BID hx of gastric sleeve  Recommend adding calcium supplementation 500 mg TID  Provides: 1280 kcals, 125 grams protein, 902 ml free water. Meets 100% of needs.   NUTRITION DIAGNOSIS:   Inadequate oral intake related to inability to eat as evidenced by NPO status.  Ongoing   GOAL:   Provide needs based on ASPEN/SCCM guidelines  Met  MONITOR:   TF tolerance, I & O's  REASON FOR ASSESSMENT:   Consult, Ventilator Enteral/tube feeding initiation and management  ASSESSMENT:   Pt with PMH of agoraphobia, liver cancer followed by [facility], CKD stage 3, GERD, gastric sleeve surgery who was admitted with anterior communicating artery aneurysm s/p coiling, during procedure pt developed bifrontal CVA and R groin hematoma with return to OR for evacuation of hematoma and repair of vessel.    1/27- embolization, right groin hematoma with repair of vessel  Discussed pt plan with RN. Pt more awake today, possible verbal communication reported. Increased muscle tone continues. Possibility pt may need trach. Pt tolerating TF, continue at current rate. Monitor for BM as pt is day 3 without.   Weight noted to increase from 112.4 kg on 1/30 to 267 lb today. Will continue to utilize EDW of 95.3 kg to estimate needs as pt is fluid overloaded.   GOC continue to be discussed per CCM. Pt now DNR.   Patient is currently intubated on ventilator support MV: 25.2 L/min Temp (24hrs), Avg:100.3 F (37.9 C), Min:99.6 F (37.6 C), Max:101.2 F (38.4 C) BP: 149/86 MAP: 103  I/O: +23,462 ml since admit UOP: 1975 ml x 24 hrs   Medications reviewed and include: MVI with minerals, precedex, LR @ 10 ml/hr Labs reviewed: CBG 93-117  Diet Order:   Diet Order            Diet NPO time specified  Diet effective now               EDUCATION NEEDS:   No education needs have been identified at this time  Skin:  Skin Assessment: Skin Integrity Issues: Skin Integrity Issues:: Incisions Incisions: closed right groin  Last BM:  2/2  Height:   Ht Readings from Last 1 Encounters:  03/16/2018 _0  (1.651 m)    Weight:   Wt Readings from Last 1 Encounters:  2018/05/02 121 kg    Ideal Body Weight:  56.8 kg  BMI:  Body mass index is 44.39 kg/m.  Estimated Nutritional Needs:   Kcal:  1950-9326  Protein:  >113 grams  Fluid:  > 1.5 L/day   Mariana Single RD, LDN Clinical Nutrition Pager # - [phone]

## 2018-05-05 NOTE — Progress Notes (Addendum)
Referring Physician(s): Merlene Laughter, [place]  Supervising Physician: Luanne Bras  Patient Status:  [facility] - In-pt  Chief Complaint: None  Subjective:  ACOM aneurysm s/p embolization using an ATLAS neuroform stent 03/30/2018 by Dr. Estanislado Pandy. Patient laying in bed intubatedandsedation.No family at bedside. She is more alert/responsive than yesterday. RN reports possible verbal communication with patient (states she said "help me" and "Romie Minus" last night/today). She opens eyes to voice and follows some simple facial commands (opens/closes eyes on command). She demonstrates no change to neurologic function- still with increased muscle tone in all extremities and neck but does have small spontaneous movements of lower extremities and is biting at her ET tube. Right groin incision c/d/i.   Allergies: Patient has no known allergies.  Medications: Prior to Admission medications   Medication Sig Start Date End Date Taking? Authorizing Provider  aspirin 325 MG EC tablet Take 325 mg by mouth daily. " And as needed if headache continues"   Yes [provider]  B-COMPLEX-C PO Take 1 tablet by mouth daily.   Yes [provider]  buPROPion (WELLBUTRIN SR) 150 MG 12 hr tablet Take 150 mg by mouth 2 (two) times daily.   Yes [provider]  celecoxib (CELEBREX) 200 MG capsule Take 200 mg by mouth daily. 5pm   Yes [provider]  clonazePAM (KLONOPIN) 0.5 MG tablet Take 0.5 mg by mouth 2 (two) times daily. 1 tablet in the morning and take 1 tablet at 5p. May take additional half-whole tablet throughout the day as needed for anxiety   Yes [provider]  clopidogrel (PLAVIX) 75 MG tablet Take 75 mg by mouth daily.   Yes [provider]  metoprolol succinate (TOPROL-XL) 25 MG 24 hr tablet Take 12.5 mg by mouth every morning.   Yes [provider]  pantoprazole (PROTONIX) 40 MG tablet Take 40 mg by mouth every morning.   Yes [provider]  polycarbophil (FIBERCON) 625 MG tablet Take 625 mg by mouth 2 (two) times daily.   Yes [provider]  simvastatin (ZOCOR) 20 MG tablet Take 20 mg by mouth every morning.   Yes [provider]  topiramate (TOPAMAX) 25 MG tablet Take 25 mg by mouth daily. 25mg  at night for one week then 50 mg at night thereafter   Yes [provider]  venlafaxine XR (EFFEXOR-XR) 150 MG 24 hr capsule Take 150 mg by mouth 2 (two) times daily.   Yes [provider]     Vital Signs: BP 129/87   Pulse 94   Temp 99.6 F (37.6 C) (Axillary)   Resp (!) 23   Ht 5\' 5"  (1.651 m)   Wt 266 lb 12.1 oz (121 kg)   SpO2 100%   BMI 44.39 kg/m   Physical Exam Vitals signs and nursing note reviewed.  Constitutional:      General: She is not in acute distress.    Appearance: Normal appearance.     Comments: Intubated and sedated.  Pulmonary:     Effort: Pulmonary effort is normal. No respiratory distress.     Comments: Intubated and sedated. Skin:    General: Skin is warm and dry.     Comments: Right groin incision soft with surrounding ecchymosis (extensive down medial right thigh and lateral to incision site), no active bleeding or hematoma.  Neurological:     Comments:  Intubated and sedated but more alert/responsive than yesterday. She opens eyes to voice and follows some simple facial commands (  opens/closes eyes on command). PERRL bilaterally. EOMs not assessed. Visual fields not assessed. Unable to assess facial asymmetry due to ET tube. Unable to assess tongue protrusion due to ET tube. She demonstrates no change to neurologic function- still with increased muscle tone in all extremities and neck but does have small spontaneous movements of lower extremities and is biting at her ET tube. Pronator drift not assessed. Fine motor and coordination not assessed. Gait not assessed. Romberg not assessed. Heel to toe not assessed. Distal pulses palpable  bilaterally with Doppler.   Psychiatric:     Comments: Intubated and sedated.     Imaging: Ct Head Wo Contrast  Result Date: 04/08/2018 CLINICAL DATA:  Follow up stroke. Status post stenting of A-comm aneurysm. EXAM: CT HEAD WITHOUT CONTRAST TECHNIQUE: Contiguous axial images were obtained from the base of the skull through the vertex without intravenous contrast. COMPARISON:  MRI of the head April 03, 2018 FINDINGS: BRAIN: Evolving acute bilateral frontoparietal infarcts with petechial hemorrhage. Mild regional mass effect without midline shift. No lobar hematoma. No parenchymal brain volume loss for age. No hydrocephalus. No abnormal extra-axial fluid collections. Basal cisterns are patent. VASCULAR: ACA stent. SKULL/SOFT TISSUES: No skull fracture. No significant soft tissue swelling. ORBITS/SINUSES: The included ocular globes and orbital contents are normal.Mild paranasal sinus mucosal thickening. Mastoid air cells are well aerated. OTHER: Life support lines in place. IMPRESSION: 1. 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 Electronically Signed   By: Elon Alas M.D.   On: 04/08/2018 05:27   Dg Chest Port 1 View  Result Date: 04/08/2018 CLINICAL DATA:  Respiratory distress. Shortness of breath. EXAM: PORTABLE CHEST 1 VIEW COMPARISON:  04/06/2018 FINDINGS: Endotracheal tube tip measures 3.5 cm above the carina. Enteric tube tip is off the field of view but below the left hemidiaphragm. A right PICC line is in place with tip over the low SVC region. No pneumothorax. Shallow inspiration. Heart size is normal for technique. Diffuse bilateral airspace infiltrates throughout the lungs bilaterally, progressing since previous study. This may represent edema, pneumonia, or ARDS. No blunting of costophrenic angles. IMPRESSION: Appliances appear in satisfactory location. Increasing diffuse bilateral pulmonary infiltrates. Electronically  Signed   By: Lucienne Capers M.D.   On: 04/08/2018 00:12    Labs:  CBC: Recent Labs    04/05/18 1818 04/06/18 0500 04/07/18 0556 04/08/18 0103 04/08/18 0555  WBC 7.3 9.4 13.4*  --  19.3*  HGB 8.0* 8.0* 7.6* 7.1* 7.5*  HCT 24.5* 24.1* 23.9* 21.0* 23.8*  PLT 94* 105* 121*  --  165    COAGS: Recent Labs    03/10/18 1058 03/22/2018 0653 03/07/2018 1608  INR 0.97 1.03 1.24  APTT 25  --  26    BMP: Recent Labs    04/04/18 1045 04/05/18 0553 04/06/18 0500 04/07/18 0556 04/08/18 0103  NA 139 140 138 141 142  K 3.3* 3.1* 3.5 3.6 4.1  CL 117* 113* 111 116*  --   CO2 17* 22 19* 19*  --   GLUCOSE 156* 145* 127* 116*  --   BUN 28* 25* 24* 24*  --   CALCIUM 8.6* 8.6* 8.7* 8.2*  --   CREATININE 0.97 0.85 0.85 0.90  --   GFRNONAA >60 >60 >60 >60  --   GFRAA >60 >60 >60 >60  --     LIVER FUNCTION TESTS: Recent Labs    03/10/18 1058 03/11/18 2024  BILITOT 0.5 0.3  AST 18 16  ALT  17 14  ALKPHOS 101 80  PROT 6.9 5.7*  ALBUMIN 3.8 3.2*    Assessment and Plan:  ACOM aneurysm s/p embolization using an ATLAS neuroform stent 04/03/2018 by Dr. Estanislado Pandy. Patient's condition slightly improved as she is more alert/responsive today- RN reports possible verbal communication with patient, she opens eyes to voice and follows some simple facial commands (opens/closes eyes on command), but she demonstrates no change to neurologic function- still with increased muscle tone in all extremities and neck but does have small spontaneous movements of lower extremities and is biting at her ET tube. Right groin incision stable- plans per vascular surgery. GOC/DNR meeting planned for this week. Continue taking Brilinta 90 mg twice daily and Aspirin 81 mg once daily. Appreciate and agree with neurology,CCM, and vascular surgerymanagement. IR to follow.   Electronically Signed: Earley Abide, PA-C 2018-04-23, 9:01 AM   I spent a total of 25 Minutes at the the patient's bedside AND on the  patient's hospital floor or unit, greater than 50% of which was counseling/coordinating care for [facility] aneurysm s/p embolization.

## 2018-05-05 NOTE — Death Summary Note (Signed)
DEATH SUMMARY   Patient Details  Name: Rebecca Cox MRN: [id number] DOB: 1960-05-15  Admission/Discharge Information   Admit Date:  04/05/18  Date of Death: Date of Death: 2018-04-14  Time of Death: Time of Death: 2316/04/19  Length of Stay: 2022/04/19  Referring Physician: System, Pcp Not In    Diagnoses  Preliminary cause of death: Respiratory failure Secondary Diagnoses (including complications and co-morbidities): Watershed ischemic strokes, hypovolemic shock   Brief Hospital Course (including significant findings, care, treatment, and services provided and events leading to death)  [name] is a 58 y.o. year old female with a past medical history of CAD, ACOM aneurysm, GERD, esophageal hernia, diverticulitis, liver cancer, sleep apnea, arthritis, depression, and agoraphobia.  Patient presented to [facility] 04-05-18 for an image-guided cerebral arteriogram with embolization of her ACOM aneurysm using an ATLAS neuroform stent by Dr. Estanislado Pandy. Following procedure post-extubation, patient had difficulty moving right arm and left leg so a code stroke was called. MRI revealed bilateral ACA strokes along with concern for an ACA/MCA watershed infarct. Of note, during this time while holding manual pressure on the right groin, an expanding hematoma developed despite constant pressure along with secondary hypotension. Vascular surgery was consulted and patient underwent right groin exploration and evacuation of hematoma along with repair of deep femoral artery in the OR by Dr. Scot Dock. Patient was then transferred to neuro ICU intubated for further management.  Patient's hospital course was further complicated by respiratory failure requiring mechanical ventilation from inability to protect airway, extension of ACA/MCA watershed infarcts as seen on routine imaging scans, vegetative state with increased muscle tone bilaterally, hypovolemic shock, severe anemia requiring blood transfusion, and fever/leukocytosis.  Multiple discussions with patient's husband were had with both Dr. Erlinda Hong and Dr. Leonie Man (stroke team) regarding patient's goals of care and on 04-14-2018 decision was made by patient's husband to make patient DNR and care was withdrawn. Patient was extubated on 04/14/2018 at 30 and time of death was called at 04/19/16.   Pertinent Labs and Studies  Significant Diagnostic Studies Ct Head Wo Contrast  Result Date: 04/08/2018 CLINICAL DATA:  Follow up stroke. Status post stenting of A-comm aneurysm. EXAM: CT HEAD WITHOUT CONTRAST TECHNIQUE: Contiguous axial images were obtained from the base of the skull through the vertex without intravenous contrast. COMPARISON:  MRI of the head April 03, 2018 FINDINGS: BRAIN: Evolving acute bilateral frontoparietal infarcts with petechial hemorrhage. Mild regional mass effect without midline shift. No lobar hematoma. No parenchymal brain volume loss for age. No hydrocephalus. No abnormal extra-axial fluid collections. Basal cisterns are patent. VASCULAR: ACA stent. SKULL/SOFT TISSUES: No skull fracture. No significant soft tissue swelling. ORBITS/SINUSES: The included ocular globes and orbital contents are normal.Mild paranasal sinus mucosal thickening. Mastoid air cells are well aerated. OTHER: Life support lines in place. IMPRESSION: 1. 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 Electronically Signed   By: Elon Alas M.D.   On: 04/08/2018 05:27   Ct Head Wo Contrast  Result Date: 04/03/2018 CLINICAL DATA:  58 y/o  F; follow-up of stroke. EXAM: CT HEAD WITHOUT CONTRAST TECHNIQUE: Contiguous axial images were obtained from the base of the skull through the vertex without intravenous contrast. COMPARISON:  04/02/2018 CT head. 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 Mild interval increase in edema and local mass effect. No herniation. No new stroke, hemorrhage,  extra-axial collection, or hydrocephalus. Empty sella turcica. Vascular: ACA stent noted. Calcific atherosclerosis of carotid siphons. No new hyperdense vessel. Skull:  Normal. Negative for fracture or focal lesion. Sinuses/Orbits: No acute finding. Other: None. IMPRESSION: 1. 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 Mild interval increase in edema and local mass effect. No herniation. 2. No new acute intracranial abnormality identified. Electronically Signed   By: Kristine Garbe M.D.   On: 04/03/2018 03:22   Ct Head Wo Contrast  Result Date: 04/02/2018 CLINICAL DATA:  Stroke, follow-up. EXAM: CT HEAD WITHOUT CONTRAST TECHNIQUE: Contiguous axial images were obtained from the base of the skull through the vertex without intravenous contrast. COMPARISON:  MRI brain 03/31/2018. FINDINGS: Brain: Bilateral watershed territory infarcts are confirmed as hypoattenuation involving the cortex, left greater than right. There is no associated hemorrhage. Left ACA stent is stable in position. There is no hemorrhage or new infarct. Ventricles are of normal size. Insert pass fluid Vascular: ACA stent is in place. No significant atherosclerotic calcifications are present. There is no hyperdense vessel. Skull: Calvarium is intact. No focal lytic or blastic lesions are present. Sinuses/Orbits: The paranasal sinuses and mastoid air cells are clear. The globes and orbits are within normal limits. IMPRESSION: 1. Expected progression of bilateral watershed territory infarcts, left greater than right. 2. No new infarct territory or hemorrhage. 3. ACA stent. Electronically Signed   By: San Morelle M.D.   On: 04/02/2018 09:51   Mr Virgel Paling GY Contrast  Result Date: 03/20/2018 CLINICAL DATA:  Follow-up intervention for anterior communicating artery aneurysm with stent. Acute onset of right arm and leg weakness. EXAM: MRI HEAD WITHOUT CONTRAST MRA HEAD WITHOUT CONTRAST TECHNIQUE: Multiplanar,  multiecho pulse sequences of the brain and surrounding structures were obtained without intravenous contrast. Angiographic images of the head were obtained using MRA technique without contrast. COMPARISON:  Angiographic images same day.  MRI 03/12/2018. FINDINGS: MRI HEAD FINDINGS Brain: The brain shows bilateral watershed distribution cortical and subcortical edema and restricted diffusion, affecting both hemispheres but more extensive on the left than the right. The question is if this represents true bilateral watershed infarction or if it represents a vaso regulatory disturbance related to spasm. No specific vascular territory infarction is seen. No sign of hemorrhage. Chronic small-vessel ischemic changes affect the brainstem and the cerebral hemispheric white matter as seen previously. No hydrocephalus. No extra-axial collection. Vascular: Major vessels at the base of the brain show flow. 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 There does appear to be a spasm appearance of the A1 vessel on the left and both anterior cerebral arteries, more extensively the right than the left. 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 No middle cerebral artery abnormality is seen. Posterior circulation vessels continued to show a normal appearance. IMPRESSION: Bilateral watershed distribution cortical and subcortical edema and restricted diffusion, more extensive on the left than the right. Differential diagnosis is true watershed infarction versus vaso regulatory disturbance secondary to arterial spasm, which is demonstrated in the anterior cerebral vessels by MR angiography. 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 Electronically Signed   By: Nelson Chimes M.D.   On: 03/11/2018 16:02   Mr Brain Wo Contrast  Result  Date: 04/03/2018 CLINICAL DATA:  Stroke follow-up EXAM: MRI HEAD WITHOUT CONTRAST TECHNIQUE: Multiplanar, multiecho pulse sequences of the brain and surrounding structures were obtained without intravenous contrast. COMPARISON:  Brain MRI 04/04/2018 and head CT 04/03/2018 FINDINGS: BRAIN: Compared to the prior MRI of  03/06/2018 there has been progression of bilateral watershed distribution infarcts, left-greater-than-right. The amount of ischemia is worst in the left frontal lobe. There is no midline shift or other mass effect. Partially empty sella. There is cytotoxic edema greatest in the superior anterior frontal lobes. Multifocal white matter hyperintensity, most commonly due to chronic ischemic microangiopathy. The cerebral and cerebellar volume are age-appropriate. Susceptibility-sensitive sequences show no chronic microhemorrhage or superficial siderosis. VASCULAR: Major intracranial arterial and venous sinus flow voids are normal. SKULL AND UPPER CERVICAL SPINE: Calvarial bone marrow signal is normal. There is no skull base mass. Visualized upper cervical spine and soft tissues are normal. SINUSES/ORBITS: No fluid levels or advanced mucosal thickening. No mastoid or middle ear effusion. The orbits are normal. IMPRESSION: 1. Worsening bilateral watershed distribution infarcts compared to MRI of 03/12/2018. Compared to the CT from earlier today, the distribution appears unchanged, allowing for differences in modality. 2. No hemorrhage or mass effect. Electronically Signed   By: Ulyses Jarred M.D.   On: 04/03/2018 20:33   Mr Brain Wo Contrast  Result Date: 03/17/2018 CLINICAL DATA:  Follow-up intervention for anterior communicating artery aneurysm with stent. Acute onset of right arm and leg weakness. EXAM: MRI HEAD WITHOUT CONTRAST MRA HEAD WITHOUT CONTRAST TECHNIQUE: Multiplanar, multiecho pulse sequences of the brain and surrounding structures were obtained without intravenous contrast. Angiographic  images of the head were obtained using MRA technique without contrast. COMPARISON:  Angiographic images same day.  MRI 03/12/2018. FINDINGS: MRI HEAD FINDINGS Brain: The brain shows bilateral watershed distribution cortical and subcortical edema and restricted diffusion, affecting both hemispheres but more extensive on the left than the right. The question is if this represents true bilateral watershed infarction or if it represents a vaso regulatory disturbance related to spasm. No specific vascular territory infarction is seen. No sign of hemorrhage. Chronic small-vessel ischemic changes affect the brainstem and the cerebral hemispheric white matter as seen previously. No hydrocephalus. No extra-axial collection. Vascular: Major vessels at the base of the brain show flow. 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 There does appear to be a spasm appearance of the A1 vessel on the left and both anterior cerebral arteries, more extensively the right than the left. 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 No middle cerebral artery abnormality is seen. Posterior circulation vessels continued to show a normal appearance. IMPRESSION: Bilateral watershed distribution cortical and subcortical edema and restricted diffusion, more extensive on the left than the right. Differential diagnosis is true watershed infarction versus vaso regulatory disturbance secondary to arterial spasm, which is demonstrated in the anterior cerebral vessels by MR angiography. 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 Electronically Signed   By: Nelson Chimes M.D.   On: 03/19/2018 16:02   Ir Transcath/emboliz  Result Date: 04/03/2018 CLINICAL DATA:  Patient with headaches and discovery of a bilobed approximately 5.5 mm saccular aneurysm  arising in the anterior communicating artery region. EXAM: TRANSCATHETER THERAPY EMBOLIZATION; IR ANGIO INTRA EXTRACRAN SEL INTERNAL CAROTID UNI LEFT MOD SED; RADIOLOGY EXAMINATION; IR ANGIO INTRA EXTRACRAN SEL COM CAROTID INNOMINATE UNI RIGHT MOD SED; IR ANGIO VERTEBRAL SEL VERTEBRAL BILAT MOD SED COMPARISON:  MRI MRA of the brain of 03/12/2018. MEDICATIONS: Heparin 4,500 units IV; Ancef 2 g IV. The antibiotic was administered within 1 hour of the procedure. ANESTHESIA/SEDATION: Mac anesthesia for the diagnostic portion, and  general anesthesia for the treatment portion of the procedure provided by the Department of Anesthesiology at [facility]:  Isovue 300 approximately 140 mL. FLUOROSCOPY TIME:  Fluoroscopy Time: 42 minutes 12 seconds (2076 mGy). COMPLICATIONS: None immediate. TECHNIQUE: Informed written consent was obtained from the patient after a thorough discussion of the procedural risks, benefits and alternatives. All questions were addressed. Maximal Sterile Barrier Technique was utilized including caps, mask, sterile gowns, sterile gloves, sterile drape, hand hygiene and skin antiseptic. A timeout was performed prior to the initiation of the procedure. The right groin was prepped and draped in the usual sterile fashion. Thereafter using modified Seldinger technique, transfemoral access into the right common femoral artery was obtained without difficulty. Over a 0.035 inch guidewire, a 5 French Pinnacle sheath was inserted. Through this, and also over 0.035 inch guidewire, a 5 [place] JB 1 catheter was advanced to the aortic arch region and selectively positioned in the right common carotid artery, the right vertebral artery, the left common carotid artery and the left vertebral artery. FINDINGS: The right common carotid arteriogram demonstrates the right external carotid artery and its major branches to be widely patent. The right internal carotid artery at the bulb to the cranial skull  base also demonstrates wide patency. However, there are focal segmental area of small outpouchings without intraluminal narrowing in the distal 1/3 of the right internal carotid artery most compatible with mild fibromuscular dysplasia. Distal to this the cervical petrous junction is widely patent. The petrous, cavernous and supraclinoid segments demonstrate wide patency. The right middle cerebral artery is noted to opacify into the capillary and venous phases. 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 The origin the right vertebral artery is widely patent. The vessel is seen to opacify to the cranial skull base. At the level of the cranial skull base there is a focal area of mild stenosis. Distal to this the vessel assumes normal caliber. The opacified portions of the basilar artery, the superior cerebellar arteries and the anterior-inferior cerebellar arteries is normal into the capillary and venous phases. There is flash filling of the posterior cerebral arteries with unopacified blood in the distal half of the basilar artery from the contralateral vertebral artery. The left vertebral artery origin is widely patent. The vessel opacifies normally to the cranial skull base. There is mild tortuosity in its proximal half. The left vertebrobasilar junction and the left posterior-inferior cerebellar artery demonstrate normal opacification. The basilar artery, the posterior cerebral arteries, the superior cerebellar arteries and the anterior-inferior cerebellar arteries opacify into the capillary and venous phases. A left common carotid arteriogram demonstrates the left external carotid artery and its major branches to be widely patent. The left internal carotid artery at the bulb to the cranial skull base demonstrates wide patency. The petrous, cavernous and supraclinoid segments are widely patent. The left middle cerebral artery and the left anterior cerebral artery opacify  into the capillary and venous phases. There is cross-filling via the anterior communicating artery of the right anterior cerebral artery. 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 Measurements were also performed of the left anterior cerebral artery A1 segment distally, and the right anterior cerebral artery A2 segment proximally. The angiographic findings were reviewed with the patient and the patient's husband. Again briefly reviewed were options of no treatment and conservative management versus endovascular treatment versus  consideration for surgical clipping. The husband and the patient consented to endovascular treatment. They were informed that this may entail staged embolization given the wide neck of the aneurysm. The patient was then put under general anesthesia by the Department of Anesthesiology at [place] 1 catheter in the left common carotid artery was then exchanged over a 0.035 inch 300 cm Constance Holster exchange guidewire for a 6 French 80 cm Cook shuttle sheath using biplane roadmap technique constant fluoroscopic guidance. Good aspiration obtained from the hub of the [facility] shuttle sheath. A gentle control arteriogram performed through the [facility] shuttle sheath demonstrated no evidence of spasms, dissections or of intraluminal filling defects. Over a 0.035 inch Roadrunner guidewire, using biplane roadmap technique and constant fluoroscopic guidance, a Sofia 5 French 115 cm intermediary catheter was then advanced to the distal cervical segment of the left internal carotid artery followed by the [place] 5 [place] guide catheter. The guidewire was removed. Good aspiration was obtained from the hub of the [place] guide catheter. A control arteriogram performed through this demonstrated no change in the intracranial circulation. At this time, the [facility] shuttle  sheath was advanced cranially to the mid cervical left ICA. Using biplane roadmap technique and constant fluoroscopic guidance, in a coaxial manner and with constant heparinized saline infusion, a Headway 17 2 tip microcatheter was advanced over a 0.014 inch Softip Synchro micro guidewire to the distal end of the [place] guide catheter. With the micro guidewire leading with a J-tip configuration, the combination was navigated to the supraclinoid left ICA. Using a torque device, the micro guidewire was then advanced into the left anterior cerebral A1 segment followed by the microcatheter. The micro guidewire was then advanced without difficulty across the anterior communicating artery into the right anterior cerebral A2 segment distally followed by the microcatheter. The guidewire was removed. Good aspiration obtained from the hub of the microcatheter. A gentle control arteriogram performed through the microcatheter demonstrated safe position of the tip of the microcatheter. This was then connected to continuous heparinized saline infusion. It was decided to place 4 mm x 24 mm Atlas Neurofrom stent across wide neck of the aneurysm as stage I of the embolization treatment. This was then advanced in a coaxial manner and with constant heparinized saline infusion to the distal end of the microcatheter. The O ring on the delivery microcatheter was then loosened. The tip of the Sofia catheter was now in the supraclinoid left ICA. Combination of the stent within the microcatheter was then slightly retrieved ensuring that there would be adequate coverage of the neck of the aneurysm. Once there, with slight forward gentle traction with the right hand on the delivery micro guidewire, with the left hand the delivery microcatheter was retrieved unsheathing the distal, and then the proximal portion of the stent. This was deployed without any difficulty. The delivery micro guidewire was then removed. Control arteriogram performed  through the [facility] guide catheter in the supraclinoid left ICA demonstrated excellent apposition of the proximal and the distal portion of the stent with wide patency. There was already stasis noted within the dome of the aneurysm. Control arteriograms were then performed through the [facility] guide catheter in the left internal carotid artery at 10, 20 and 40 minutes post deployment of the stent. These continued to demonstrate excellent apposition without evidence of intraluminal filling defects within the stented segment and also distally. Patency of the anterior cerebral arteries bilaterally remained patent. No abnormal dissections or of intraluminal filling defects  were seen. The 5 [place] intermediary catheter and the [facility] shuttle sheath were then retrieved and removed into the abdominal aorta and exchanged over a J-tip guidewire for a 6 [place] Pinnacle sheath. Throughout the procedure, the patient's blood pressure and neurological status remained stable. The patient's ACT was maintained in the region of approximately 200 seconds. No evidence of extravasation or dissections or spasm was seen. The 6 French Pinnacle sheath was then removed with manual pressure being held. At the time of removal of the sheath there already was a small hematoma just below the puncture site. This, however, continued to worsen during the process of manual compression. During this time the patient had also been extubated and was maintaining adequate blood gases. The patient was also responsive appropriately and moving her right leg and left arm. She continued to have difficulty with weakness of the left leg and right arm. Increased tone was noted in the left leg. The patient was, otherwise, appropriately responsive in terms of speech without any facial asymmetry. Her tongue was in the midline. She did not exhibit any sensory changes. However, because of the new neurological changes, a code stroke was called. The neurology service evaluated  the patient and it was decided to proceed with further workup with an MRI scan in order to ascertain the reason for the new neurological changes. However, because of the groin hematoma in the right groin with the patient's blood pressure falling associated with tachycardia, resuscitation was performed with IV fluids albumin and vasopressors. Instead vascular surgery consult was also called. The patient was evaluated by vascular surgery and it was decided that because of the low blood pressure despite vasopressor support and the expanding hematoma in the right groin, surgical aspiration was priority prior to consideration of further workup with an MRI of the brain. After patient had been resuscitated to blood pressure of 110-120 with vasopressors IV fluids and albumin, the patient was taken to the OR for right groin surgical exploration. 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 PLAN: Follow-up of in the clinic approximately 2-4 weeks post discharge. Electronically Signed   By: Luanne Bras M.D.   On: 04/02/2018 16:34   Dg Chest Port 1 View  Result Date: 04/08/2018 CLINICAL DATA:  Respiratory distress. Shortness of breath. EXAM: PORTABLE CHEST 1 VIEW COMPARISON:  04/06/2018 FINDINGS: Endotracheal tube tip measures 3.5 cm above the carina. Enteric tube tip is off the field of view but below the left hemidiaphragm. A right PICC line is in place with tip over the low SVC region. No pneumothorax. Shallow inspiration. Heart size is normal for technique. Diffuse bilateral airspace infiltrates throughout the lungs bilaterally, progressing since previous study. This may represent edema, pneumonia, or ARDS. No blunting of costophrenic angles. IMPRESSION: Appliances appear in satisfactory location. Increasing diffuse bilateral pulmonary infiltrates. Electronically Signed    By: Lucienne Capers M.D.   On: 04/08/2018 00:12   Dg Chest Portable 1 View  Result Date: 04/06/2018 CLINICAL DATA:  Endotracheal tube position. EXAM: PORTABLE CHEST 1 VIEW COMPARISON:  Radiograph of April 06, 2018. FINDINGS: Stable cardiomegaly. Endotracheal and nasogastric tubes are unchanged in position. Right subclavian catheter is unchanged in position. No pneumothorax or pleural effusion is noted. Stable right upper lobe and left perihilar opacities are noted concerning for pneumonia or possibly edema. Bony thorax is unremarkable. IMPRESSION: Stable bilateral lung opacities are noted  concerning for pneumonia or edema. Stable support apparatus. Electronically Signed   By: Marijo Conception, M.D.   On: 04/06/2018 08:16   Dg Chest Port 1 View  Result Date: 04/06/2018 CLINICAL DATA:  Respiratory failure. EXAM: PORTABLE CHEST 1 VIEW COMPARISON:  Radiograph of April 05, 2018. FINDINGS: Endotracheal and nasogastric tubes are unchanged in position. Right subclavian catheter is unchanged in position. Stable cardiomediastinal silhouette is noted. Increased bilateral perihilar and upper lobe opacities are noted concerning for edema or pneumonia. Bony thorax is unremarkable. IMPRESSION: Stable support apparatus. Increased bilateral perihilar and upper lobe opacities are noted concerning for edema or pneumonia. Electronically Signed   By: Marijo Conception, M.D.   On: 04/06/2018 08:12   Dg Chest Port 1 View  Result Date: 04/05/2018 CLINICAL DATA:  Respiratory failure. EXAM: PORTABLE CHEST 1 VIEW COMPARISON:  04/04/2018. FINDINGS: Endotracheal tube, NG tube, right PICC line stable position. Heart size normal. Mild bilateral multifocal infiltrates/edema are noted. Heart size normal. No pleural effusion or pneumothorax. IMPRESSION: 1.  Lines and tubes in stable position. 2. Mild bilateral multifocal infiltrates/edema noted on today's exam. Aspiration can not be excluded. Electronically Signed   By: Marcello Moores   Register   On: 04/05/2018 06:44   Dg Chest Port 1 View  Result Date: 04/04/2018 CLINICAL DATA:  Respiratory failure EXAM: PORTABLE CHEST 1 VIEW COMPARISON:  Two days ago FINDINGS: Endotracheal tube tip at the clavicular heads. The orogastric tube reaches the stomach. Mild atelectatic type opacity. There is no edema, consolidation, effusion, or pneumothorax. Normal heart size. IMPRESSION: Stable hardware positioning and atelectasis. Electronically Signed   By: Monte Fantasia M.D.   On: 04/04/2018 08:13   Dg Chest Port 1 View  Result Date: 04/02/2018 CLINICAL DATA:  Endotracheal tube. Dizziness. Weakness. Liver cancer. EXAM: PORTABLE CHEST 1 VIEW COMPARISON:  03/07/2018 FINDINGS: Stable endotracheal tube. NG tube placed. Tip is beyond the gastroesophageal junction. Normal heart size. Clear lungs. No pneumothorax. IMPRESSION: NG tube placed beyond the GE junction. Lungs remain clear. Electronically Signed   By: Marybelle Killings M.D.   On: 04/02/2018 07:58   Dg Chest Port 1 View  Result Date: 03/07/2018 CLINICAL DATA:  Status post intubation. EXAM: PORTABLE CHEST 1 VIEW COMPARISON:  None. FINDINGS: ET tube is in place with the tip approximately 1.7 cm above the carina. Lungs are clear. No pneumothorax or pleural effusion. Heart size is normal. No acute or focal bony abnormality. IMPRESSION: ETT tip is 1.7 cm above the carina. Lungs are clear. Electronically Signed   By: Inge Rise M.D.   On: 03/06/2018 16:08   Dg Abd Portable 1v  Result Date: 03/23/2018 CLINICAL DATA:  Feeding tube placement. EXAM: PORTABLE ABDOMEN - 1 VIEW COMPARISON:  None. FINDINGS: Enteric tube tip in the gastric antrum, likely near the pylorus. Nonobstructive bowel gas pattern. Visualized lungs are clear. IMPRESSION: Enteric tube tip in the distal stomach, likely near the pylorus. Electronically Signed   By: Titus Dubin M.D.   On: 03/28/2018 22:25   [place] Ekg Site Rite  Result Date: 04/03/2018 If Site Rite image not  attached, placement could not be confirmed due to current cardiac rhythm.  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 EXAM: TRANSCATHETER THERAPY EMBOLIZATION; IR ANGIO INTRA EXTRACRAN SEL INTERNAL CAROTID UNI LEFT MOD SED; RADIOLOGY EXAMINATION; IR ANGIO  INTRA EXTRACRAN SEL COM CAROTID INNOMINATE UNI RIGHT MOD SED; IR ANGIO VERTEBRAL SEL VERTEBRAL BILAT MOD SED COMPARISON:  MRI MRA of the brain of 03/12/2018. MEDICATIONS: Heparin 4,500 units IV; Ancef 2 g IV. The antibiotic was administered within 1 hour of the procedure. ANESTHESIA/SEDATION: Mac anesthesia for the diagnostic portion, and general anesthesia for the treatment portion of the procedure provided by the Department of Anesthesiology at [place]:  Isovue 300 approximately 140 mL. FLUOROSCOPY TIME:  Fluoroscopy Time: 42 minutes 12 seconds (2076 mGy). COMPLICATIONS: None immediate. TECHNIQUE: Informed written consent was obtained from the patient after a thorough discussion of the procedural risks, benefits and alternatives. All questions were addressed. Maximal Sterile Barrier Technique was utilized including caps, mask, sterile gowns, sterile gloves, sterile drape, hand hygiene and skin antiseptic. A timeout was performed prior to the initiation of the procedure. The right groin was prepped and draped in the usual sterile fashion. Thereafter using modified Seldinger technique, transfemoral access into the right common femoral artery was obtained without difficulty. Over a 0.035 inch guidewire, a 5 French Pinnacle sheath was inserted. Through this, and also over 0.035 inch guidewire, a 5 [place] JB 1 catheter was advanced to the aortic arch region and selectively positioned in the right common carotid artery, the right vertebral  artery, the left common carotid artery and the left vertebral artery. FINDINGS: The right common carotid arteriogram demonstrates the right external carotid artery and its major branches to be widely patent. The right internal carotid artery at the bulb to the cranial skull base also demonstrates wide patency. However, there are focal segmental area of small outpouchings without intraluminal narrowing in the distal 1/3 of the right internal carotid artery most compatible with mild fibromuscular dysplasia. Distal to this the cervical petrous junction is widely patent. The petrous, cavernous and supraclinoid segments demonstrate wide patency. The right middle cerebral artery is noted to opacify into the capillary and venous phases. 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 The origin the right vertebral artery is widely patent. The vessel is seen to opacify to the cranial skull base. At the level of the cranial skull base there is a focal area of mild stenosis. Distal to this the vessel assumes normal caliber. The opacified portions of the basilar artery, the superior cerebellar arteries and the anterior-inferior cerebellar arteries is normal into the capillary and venous phases. There is flash filling of the posterior cerebral arteries with unopacified blood in the distal half of the basilar artery from the contralateral vertebral artery. The left vertebral artery origin is widely patent. The vessel opacifies normally to the cranial skull base. There is mild tortuosity in its proximal half. The left vertebrobasilar junction and the left posterior-inferior cerebellar artery demonstrate normal opacification. The basilar artery, the posterior cerebral arteries, the superior cerebellar arteries and the anterior-inferior cerebellar arteries opacify into the capillary and venous phases. A left common carotid arteriogram demonstrates the left external carotid artery and its major  branches to be widely patent. The left internal carotid artery at the bulb to the cranial skull base demonstrates wide patency. The petrous, cavernous and supraclinoid segments are widely patent. The left middle cerebral artery and the left anterior cerebral artery opacify into the capillary and venous phases. There is cross-filling via the anterior communicating artery of the right anterior cerebral artery. Arising in the anterior communicating artery region the magnified oblique views demonstrates the presence of an approximately 5.2 mm x 3.3  mm wide neck lobulated aneurysm. Measurements were also performed of the left anterior cerebral artery A1 segment distally, and the right anterior cerebral artery A2 segment proximally. The angiographic findings were reviewed with the patient and the patient's husband. Again briefly reviewed were options of no treatment and conservative management versus endovascular treatment versus consideration for surgical clipping. The husband and the patient consented to endovascular treatment. They were informed that this may entail staged embolization given the wide neck of the aneurysm. The patient was then put under general anesthesia by the Department of Anesthesiology at [place] 1 catheter in the left common carotid artery was then exchanged over a 0.035 inch 300 cm Constance Holster exchange guidewire for a 6 French 80 cm Cook shuttle sheath using biplane roadmap technique constant fluoroscopic guidance. Good aspiration obtained from the hub of the [facility] shuttle sheath. A gentle control arteriogram performed through the [facility] shuttle sheath demonstrated no evidence of spasms, dissections or of intraluminal filling defects. Over a 0.035 inch Roadrunner guidewire, using biplane roadmap technique and constant fluoroscopic guidance, a Sofia 5 French 115 cm intermediary catheter was then advanced to the distal cervical segment of the left internal carotid artery  followed by the [place] 5 [place] guide catheter. The guidewire was removed. Good aspiration was obtained from the hub of the [place] guide catheter. A control arteriogram performed through this demonstrated no change in the intracranial circulation. At this time, the [facility] shuttle sheath was advanced cranially to the mid cervical left ICA. Using biplane roadmap technique and constant fluoroscopic guidance, in a coaxial manner and with constant heparinized saline infusion, a Headway 17 2 tip microcatheter was advanced over a 0.014 inch Softip Synchro micro guidewire to the distal end of the [place] guide catheter. With the micro guidewire leading with a J-tip configuration, the combination was navigated to the supraclinoid left ICA. Using a torque device, the micro guidewire was then advanced into the left anterior cerebral A1 segment followed by the microcatheter. The micro guidewire was then advanced without difficulty across the anterior communicating artery into the right anterior cerebral A2 segment distally followed by the microcatheter. The guidewire was removed. Good aspiration obtained from the hub of the microcatheter. A gentle control arteriogram performed through the microcatheter demonstrated safe position of the tip of the microcatheter. This was then connected to continuous heparinized saline infusion. It was decided to place 4 mm x 24 mm Atlas Neurofrom stent across wide neck of the aneurysm as stage I of the embolization treatment. This was then advanced in a coaxial manner and with constant heparinized saline infusion to the distal end of the microcatheter. The O ring on the delivery microcatheter was then loosened. The tip of the Sofia catheter was now in the supraclinoid left ICA. Combination of the stent within the microcatheter was then slightly retrieved ensuring that there would be adequate coverage of the neck of the aneurysm. Once there, with slight forward gentle traction with the right hand on  the delivery micro guidewire, with the left hand the delivery microcatheter was retrieved unsheathing the distal, and then the proximal portion of the stent. This was deployed without any difficulty. The delivery micro guidewire was then removed. Control arteriogram performed through the [facility] guide catheter in the supraclinoid left ICA demonstrated excellent apposition of the proximal and the distal portion of the stent with wide patency. There was already stasis noted within the dome of the aneurysm. Control arteriograms were then performed through the [facility] guide  catheter in the left internal carotid artery at 10, 20 and 40 minutes post deployment of the stent. These continued to demonstrate excellent apposition without evidence of intraluminal filling defects within the stented segment and also distally. Patency of the anterior cerebral arteries bilaterally remained patent. No abnormal dissections or of intraluminal filling defects were seen. The 5 [place] intermediary catheter and the [facility] shuttle sheath were then retrieved and removed into the abdominal aorta and exchanged over a J-tip guidewire for a 6 [place] Pinnacle sheath. Throughout the procedure, the patient's blood pressure and neurological status remained stable. The patient's ACT was maintained in the region of approximately 200 seconds. No evidence of extravasation or dissections or spasm was seen. The 6 French Pinnacle sheath was then removed with manual pressure being held. At the time of removal of the sheath there already was a small hematoma just below the puncture site. This, however, continued to worsen during the process of manual compression. During this time the patient had also been extubated and was maintaining adequate blood gases. The patient was also responsive appropriately and moving her right leg and left arm. She continued to have difficulty with weakness of the left leg and right arm. Increased tone was noted in the left leg. The  patient was, otherwise, appropriately responsive in terms of speech without any facial asymmetry. Her tongue was in the midline. She did not exhibit any sensory changes. However, because of the new neurological changes, a code stroke was called. The neurology service evaluated the patient and it was decided to proceed with further workup with an MRI scan in order to ascertain the reason for the new neurological changes. However, because of the groin hematoma in the right groin with the patient's blood pressure falling associated with tachycardia, resuscitation was performed with IV fluids albumin and vasopressors. Instead vascular surgery consult was also called. The patient was evaluated by vascular surgery and it was decided that because of the low blood pressure despite vasopressor support and the expanding hematoma in the right groin, surgical aspiration was priority prior to consideration of further workup with an MRI of the brain. After patient had been resuscitated to blood pressure of 110-120 with vasopressors IV fluids and albumin, the patient was taken to the OR for right groin surgical exploration. 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 PLAN: Follow-up of in the clinic approximately 2-4 weeks post discharge. Electronically Signed   By: Luanne Bras M.D.   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 EXAM: TRANSCATHETER THERAPY EMBOLIZATION; IR ANGIO INTRA EXTRACRAN SEL INTERNAL CAROTID UNI LEFT MOD SED; RADIOLOGY EXAMINATION; IR ANGIO INTRA EXTRACRAN SEL COM CAROTID INNOMINATE UNI RIGHT MOD SED; IR ANGIO VERTEBRAL SEL VERTEBRAL  BILAT MOD SED COMPARISON:  MRI MRA of the brain of 03/12/2018. MEDICATIONS: Heparin 4,500 units IV; Ancef 2 g IV. The antibiotic was administered within 1 hour of the procedure. ANESTHESIA/SEDATION: Mac anesthesia for the diagnostic portion, and general anesthesia for the treatment portion of the procedure provided by the Department of Anesthesiology at [place]:  Isovue 300 approximately 140 mL. FLUOROSCOPY TIME:  Fluoroscopy Time: 42 minutes 12 seconds (2076 mGy). COMPLICATIONS:  None immediate. TECHNIQUE: Informed written consent was obtained from the patient after a thorough discussion of the procedural risks, benefits and alternatives. All questions were addressed. Maximal Sterile Barrier Technique was utilized including caps, mask, sterile gowns, sterile gloves, sterile drape, hand hygiene and skin antiseptic. A timeout was performed prior to the initiation of the procedure. The right groin was prepped and draped in the usual sterile fashion. Thereafter using modified Seldinger technique, transfemoral access into the right common femoral artery was obtained without difficulty. Over a 0.035 inch guidewire, a 5 French Pinnacle sheath was inserted. Through this, and also over 0.035 inch guidewire, a 5 [place] JB 1 catheter was advanced to the aortic arch region and selectively positioned in the right common carotid artery, the right vertebral artery, the left common carotid artery and the left vertebral artery. FINDINGS: The right common carotid arteriogram demonstrates the right external carotid artery and its major branches to be widely patent. The right internal carotid artery at the bulb to the cranial skull base also demonstrates wide patency. However, there are focal segmental area of small outpouchings without intraluminal narrowing in the distal 1/3 of the right internal carotid artery most compatible with mild fibromuscular dysplasia. Distal to this the cervical petrous junction is  widely patent. The petrous, cavernous and supraclinoid segments demonstrate wide patency. The right middle cerebral artery is noted to opacify into the capillary and venous phases. 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 The origin the right vertebral artery is widely patent. The vessel is seen to opacify to the cranial skull base. At the level of the cranial skull base there is a focal area of mild stenosis. Distal to this the vessel assumes normal caliber. The opacified portions of the basilar artery, the superior cerebellar arteries and the anterior-inferior cerebellar arteries is normal into the capillary and venous phases. There is flash filling of the posterior cerebral arteries with unopacified blood in the distal half of the basilar artery from the contralateral vertebral artery. The left vertebral artery origin is widely patent. The vessel opacifies normally to the cranial skull base. There is mild tortuosity in its proximal half. The left vertebrobasilar junction and the left posterior-inferior cerebellar artery demonstrate normal opacification. The basilar artery, the posterior cerebral arteries, the superior cerebellar arteries and the anterior-inferior cerebellar arteries opacify into the capillary and venous phases. A left common carotid arteriogram demonstrates the left external carotid artery and its major branches to be widely patent. The left internal carotid artery at the bulb to the cranial skull base demonstrates wide patency. The petrous, cavernous and supraclinoid segments are widely patent. The left middle cerebral artery and the left anterior cerebral artery opacify into the capillary and venous phases. There is cross-filling via the anterior communicating artery of the right anterior cerebral artery. Arising in the anterior communicating artery region the magnified oblique views demonstrates the presence of an approximately 5.2 mm x 3.3 mm wide  neck lobulated aneurysm. Measurements were also performed of the left anterior cerebral artery A1 segment distally, and the right anterior cerebral artery A2 segment proximally. The angiographic findings were reviewed with the patient and the patient's husband. Again briefly reviewed were options of no treatment and conservative management versus endovascular treatment versus consideration for surgical clipping. The husband and the patient consented to endovascular treatment. They were informed that this may entail staged embolization given the wide neck of the aneurysm. The patient was then put under general anesthesia by  the Department of Anesthesiology at [place] 1 catheter in the left common carotid artery was then exchanged over a 0.035 inch 300 cm Constance Holster exchange guidewire for a 6 French 80 cm Cook shuttle sheath using biplane roadmap technique constant fluoroscopic guidance. Good aspiration obtained from the hub of the [facility] shuttle sheath. A gentle control arteriogram performed through the [facility] shuttle sheath demonstrated no evidence of spasms, dissections or of intraluminal filling defects. Over a 0.035 inch Roadrunner guidewire, using biplane roadmap technique and constant fluoroscopic guidance, a Sofia 5 French 115 cm intermediary catheter was then advanced to the distal cervical segment of the left internal carotid artery followed by the [place] 5 [place] guide catheter. The guidewire was removed. Good aspiration was obtained from the hub of the [place] guide catheter. A control arteriogram performed through this demonstrated no change in the intracranial circulation. At this time, the [facility] shuttle sheath was advanced cranially to the mid cervical left ICA. Using biplane roadmap technique and constant fluoroscopic guidance, in a coaxial manner and with constant heparinized saline infusion, a Headway 17 2 tip microcatheter was advanced over a 0.014 inch Softip Synchro micro  guidewire to the distal end of the [place] guide catheter. With the micro guidewire leading with a J-tip configuration, the combination was navigated to the supraclinoid left ICA. Using a torque device, the micro guidewire was then advanced into the left anterior cerebral A1 segment followed by the microcatheter. The micro guidewire was then advanced without difficulty across the anterior communicating artery into the right anterior cerebral A2 segment distally followed by the microcatheter. The guidewire was removed. Good aspiration obtained from the hub of the microcatheter. A gentle control arteriogram performed through the microcatheter demonstrated safe position of the tip of the microcatheter. This was then connected to continuous heparinized saline infusion. It was decided to place 4 mm x 24 mm Atlas Neurofrom stent across wide neck of the aneurysm as stage I of the embolization treatment. This was then advanced in a coaxial manner and with constant heparinized saline infusion to the distal end of the microcatheter. The O ring on the delivery microcatheter was then loosened. The tip of the Sofia catheter was now in the supraclinoid left ICA. Combination of the stent within the microcatheter was then slightly retrieved ensuring that there would be adequate coverage of the neck of the aneurysm. Once there, with slight forward gentle traction with the right hand on the delivery micro guidewire, with the left hand the delivery microcatheter was retrieved unsheathing the distal, and then the proximal portion of the stent. This was deployed without any difficulty. The delivery micro guidewire was then removed. Control arteriogram performed through the [facility] guide catheter in the supraclinoid left ICA demonstrated excellent apposition of the proximal and the distal portion of the stent with wide patency. There was already stasis noted within the dome of the aneurysm. Control arteriograms were then performed through  the [facility] guide catheter in the left internal carotid artery at 10, 20 and 40 minutes post deployment of the stent. These continued to demonstrate excellent apposition without evidence of intraluminal filling defects within the stented segment and also distally. Patency of the anterior cerebral arteries bilaterally remained patent. No abnormal dissections or of intraluminal filling defects were seen. The 5 [place] intermediary catheter and the [facility] shuttle sheath were then retrieved and removed into the abdominal aorta and exchanged over a J-tip guidewire for a 6 [place] Pinnacle sheath. Throughout the procedure, the patient's  blood pressure and neurological status remained stable. The patient's ACT was maintained in the region of approximately 200 seconds. No evidence of extravasation or dissections or spasm was seen. The 6 French Pinnacle sheath was then removed with manual pressure being held. At the time of removal of the sheath there already was a small hematoma just below the puncture site. This, however, continued to worsen during the process of manual compression. During this time the patient had also been extubated and was maintaining adequate blood gases. The patient was also responsive appropriately and moving her right leg and left arm. She continued to have difficulty with weakness of the left leg and right arm. Increased tone was noted in the left leg. The patient was, otherwise, appropriately responsive in terms of speech without any facial asymmetry. Her tongue was in the midline. She did not exhibit any sensory changes. However, because of the new neurological changes, a code stroke was called. The neurology service evaluated the patient and it was decided to proceed with further workup with an MRI scan in order to ascertain the reason for the new neurological changes. However, because of the groin hematoma in the right groin with the patient's blood pressure falling associated with tachycardia,  resuscitation was performed with IV fluids albumin and vasopressors. Instead vascular surgery consult was also called. The patient was evaluated by vascular surgery and it was decided that because of the low blood pressure despite vasopressor support and the expanding hematoma in the right groin, surgical aspiration was priority prior to consideration of further workup with an MRI of the brain. After patient had been resuscitated to blood pressure of 110-120 with vasopressors IV fluids and albumin, the patient was taken to the OR for right groin surgical exploration. 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 PLAN: Follow-up of in the clinic approximately 2-4 weeks post discharge. Electronically Signed   By: Luanne Bras M.D.   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 EXAM: TRANSCATHETER THERAPY EMBOLIZATION; IR ANGIO INTRA EXTRACRAN SEL INTERNAL CAROTID UNI LEFT MOD SED; RADIOLOGY EXAMINATION; IR ANGIO INTRA EXTRACRAN SEL COM CAROTID INNOMINATE UNI RIGHT MOD SED; IR ANGIO VERTEBRAL SEL VERTEBRAL BILAT MOD SED COMPARISON:  MRI MRA of the brain of 03/12/2018. MEDICATIONS: Heparin 4,500 units IV; Ancef 2 g IV. The antibiotic was administered within 1 hour of the procedure. ANESTHESIA/SEDATION: Mac anesthesia for the diagnostic portion, and general anesthesia for the treatment portion of the procedure provided by the Department of Anesthesiology at [place]:  Isovue 300 approximately 140 mL. FLUOROSCOPY TIME:  Fluoroscopy Time: 42 minutes 12 seconds (2076 mGy). COMPLICATIONS: None immediate. TECHNIQUE: Informed written consent was  obtained from the patient after a thorough discussion of the procedural risks, benefits and alternatives. All questions were addressed. Maximal Sterile Barrier Technique was utilized including caps, mask, sterile gowns, sterile gloves, sterile drape, hand hygiene and skin antiseptic. A timeout was performed prior to the initiation of the procedure. The right groin was prepped and draped in the usual sterile fashion. Thereafter using modified Seldinger technique, transfemoral access into the right common femoral artery was obtained without difficulty. Over a 0.035 inch guidewire, a [place]  sheath was inserted. Through this, and also over 0.035 inch guidewire, a 5 [place] JB 1 catheter was advanced to the aortic arch region and selectively positioned in the right common carotid artery, the right vertebral artery, the left common carotid artery and the left vertebral artery. FINDINGS: The right common carotid arteriogram demonstrates the right external carotid artery and its major branches to be widely patent. The right internal carotid artery at the bulb to the cranial skull base also demonstrates wide patency. However, there are focal segmental area of small outpouchings without intraluminal narrowing in the distal 1/3 of the right internal carotid artery most compatible with mild fibromuscular dysplasia. Distal to this the cervical petrous junction is widely patent. The petrous, cavernous and supraclinoid segments demonstrate wide patency. The right middle cerebral artery is noted to opacify into the capillary and venous phases. 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 The origin the right vertebral artery is widely patent. The vessel is seen to opacify to the cranial skull base. At the level of the cranial skull base there is a focal area of mild stenosis. Distal to this the vessel assumes normal caliber. The opacified portions of the basilar artery, the  superior cerebellar arteries and the anterior-inferior cerebellar arteries is normal into the capillary and venous phases. There is flash filling of the posterior cerebral arteries with unopacified blood in the distal half of the basilar artery from the contralateral vertebral artery. The left vertebral artery origin is widely patent. The vessel opacifies normally to the cranial skull base. There is mild tortuosity in its proximal half. The left vertebrobasilar junction and the left posterior-inferior cerebellar artery demonstrate normal opacification. The basilar artery, the posterior cerebral arteries, the superior cerebellar arteries and the anterior-inferior cerebellar arteries opacify into the capillary and venous phases. A left common carotid arteriogram demonstrates the left external carotid artery and its major branches to be widely patent. The left internal carotid artery at the bulb to the cranial skull base demonstrates wide patency. The petrous, cavernous and supraclinoid segments are widely patent. The left middle cerebral artery and the left anterior cerebral artery opacify into the capillary and venous phases. There is cross-filling via the anterior communicating artery of the right anterior cerebral artery. 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 Measurements were also performed of the left anterior cerebral artery A1 segment distally, and the right anterior cerebral artery A2 segment proximally. The angiographic findings were reviewed with the patient and the patient's husband. Again briefly reviewed were options of no treatment and conservative management versus endovascular treatment versus consideration for surgical clipping. The husband and the patient consented to endovascular treatment. They were informed that this may entail staged embolization given the wide  neck of the aneurysm. The patient was then put under general anesthesia by the Department of Anesthesiology at [place] 1 catheter in the left common carotid artery was then exchanged over a 0.035 inch 300 cm Constance Holster exchange guidewire for a 6 French 80 cm Cook shuttle sheath using biplane roadmap technique constant fluoroscopic guidance. Good aspiration obtained from the hub of the [facility] shuttle sheath. A gentle control arteriogram performed through the [facility] shuttle sheath demonstrated no evidence of spasms, dissections or of intraluminal filling defects. Over a 0.035 inch Roadrunner guidewire, using biplane roadmap technique and constant fluoroscopic guidance, a [place]  5 French 115 cm intermediary catheter was then advanced to the distal cervical segment of the left internal carotid artery followed by the [place] 5 [place] guide catheter. The guidewire was removed. Good aspiration was obtained from the hub of the [place] guide catheter. A control arteriogram performed through this demonstrated no change in the intracranial circulation. At this time, the [facility] shuttle sheath was advanced cranially to the mid cervical left ICA. Using biplane roadmap technique and constant fluoroscopic guidance, in a coaxial manner and with constant heparinized saline infusion, a Headway 17 2 tip microcatheter was advanced over a 0.014 inch Softip Synchro micro guidewire to the distal end of the [place] guide catheter. With the micro guidewire leading with a J-tip configuration, the combination was navigated to the supraclinoid left ICA. Using a torque device, the micro guidewire was then advanced into the left anterior cerebral A1 segment followed by the microcatheter. The micro guidewire was then advanced without difficulty across the anterior communicating artery into the right anterior cerebral A2 segment distally followed by the microcatheter. The guidewire was removed. Good aspiration obtained from the hub of  the microcatheter. A gentle control arteriogram performed through the microcatheter demonstrated safe position of the tip of the microcatheter. This was then connected to continuous heparinized saline infusion. It was decided to place 4 mm x 24 mm Atlas Neurofrom stent across wide neck of the aneurysm as stage I of the embolization treatment. This was then advanced in a coaxial manner and with constant heparinized saline infusion to the distal end of the microcatheter. The O ring on the delivery microcatheter was then loosened. The tip of the Sofia catheter was now in the supraclinoid left ICA. Combination of the stent within the microcatheter was then slightly retrieved ensuring that there would be adequate coverage of the neck of the aneurysm. Once there, with slight forward gentle traction with the right hand on the delivery micro guidewire, with the left hand the delivery microcatheter was retrieved unsheathing the distal, and then the proximal portion of the stent. This was deployed without any difficulty. The delivery micro guidewire was then removed. Control arteriogram performed through the [facility] guide catheter in the supraclinoid left ICA demonstrated excellent apposition of the proximal and the distal portion of the stent with wide patency. There was already stasis noted within the dome of the aneurysm. Control arteriograms were then performed through the [facility] guide catheter in the left internal carotid artery at 10, 20 and 40 minutes post deployment of the stent. These continued to demonstrate excellent apposition without evidence of intraluminal filling defects within the stented segment and also distally. Patency of the anterior cerebral arteries bilaterally remained patent. No abnormal dissections or of intraluminal filling defects were seen. The 5 [place] intermediary catheter and the [facility] shuttle sheath were then retrieved and removed into the abdominal aorta and exchanged over a J-tip guidewire for  a 6 [place] Pinnacle sheath. Throughout the procedure, the patient's blood pressure and neurological status remained stable. The patient's ACT was maintained in the region of approximately 200 seconds. No evidence of extravasation or dissections or spasm was seen. The 6 French Pinnacle sheath was then removed with manual pressure being held. At the time of removal of the sheath there already was a small hematoma just below the puncture site. This, however, continued to worsen during the process of manual compression. During this time the patient had also been extubated and was maintaining adequate blood gases. The patient was also responsive appropriately and moving  her right leg and left arm. She continued to have difficulty with weakness of the left leg and right arm. Increased tone was noted in the left leg. The patient was, otherwise, appropriately responsive in terms of speech without any facial asymmetry. Her tongue was in the midline. She did not exhibit any sensory changes. However, because of the new neurological changes, a code stroke was called. The neurology service evaluated the patient and it was decided to proceed with further workup with an MRI scan in order to ascertain the reason for the new neurological changes. However, because of the groin hematoma in the right groin with the patient's blood pressure falling associated with tachycardia, resuscitation was performed with IV fluids albumin and vasopressors. Instead vascular surgery consult was also called. The patient was evaluated by vascular surgery and it was decided that because of the low blood pressure despite vasopressor support and the expanding hematoma in the right groin, surgical aspiration was priority prior to consideration of further workup with an MRI of the brain. After patient had been resuscitated to blood pressure of 110-120 with vasopressors IV fluids and albumin, the patient was taken to the OR for right groin surgical  exploration. 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 PLAN: Follow-up of in the clinic approximately 2-4 weeks post discharge. Electronically Signed   By: Luanne Bras M.D.   On: 04/02/2018 16:34   Ir Neuro Each Add'l After Basic Uni Left (ms)  Result Date: 04/03/2018 CLINICAL DATA:  Patient with headaches and discovery of a bilobed approximately 5.5 mm saccular aneurysm arising in the anterior communicating artery region. EXAM: TRANSCATHETER THERAPY EMBOLIZATION; IR ANGIO INTRA EXTRACRAN SEL INTERNAL CAROTID UNI LEFT MOD SED; RADIOLOGY EXAMINATION; IR ANGIO INTRA EXTRACRAN SEL COM CAROTID INNOMINATE UNI RIGHT MOD SED; IR ANGIO VERTEBRAL SEL VERTEBRAL BILAT MOD SED COMPARISON:  MRI MRA of the brain of 03/12/2018. MEDICATIONS: Heparin 4,500 units IV; Ancef 2 g IV. The antibiotic was administered within 1 hour of the procedure. ANESTHESIA/SEDATION: Mac anesthesia for the diagnostic portion, and general anesthesia for the treatment portion of the procedure provided by the Department of Anesthesiology at [place]:  Isovue 300 approximately 140 mL. FLUOROSCOPY TIME:  Fluoroscopy Time: 42 minutes 12 seconds (2076 mGy). COMPLICATIONS: None immediate. TECHNIQUE: Informed written consent was obtained from the patient after a thorough discussion of the procedural risks, benefits and alternatives. All questions were addressed. Maximal Sterile Barrier Technique was utilized including caps, mask, sterile gowns, sterile gloves, sterile drape, hand hygiene and skin antiseptic. A timeout was performed prior to the initiation of the procedure. The right groin was prepped and draped in the usual sterile fashion. Thereafter using modified Seldinger technique, transfemoral access into the right common femoral artery was obtained without difficulty. Over a  0.035 inch guidewire, a 5 French Pinnacle sheath was inserted. Through this, and also over 0.035 inch guidewire, a 5 [place] JB 1 catheter was advanced to the aortic arch region and selectively positioned in the right common carotid artery, the right vertebral artery, the left common carotid artery and the left vertebral artery. FINDINGS: The right common carotid arteriogram demonstrates the right external carotid artery and its major branches to be widely patent. The right internal carotid artery at the bulb to the cranial skull base also demonstrates wide patency. However, there are focal segmental area of small outpouchings without  intraluminal narrowing in the distal 1/3 of the right internal carotid artery most compatible with mild fibromuscular dysplasia. Distal to this the cervical petrous junction is widely patent. The petrous, cavernous and supraclinoid segments demonstrate wide patency. The right middle cerebral artery is noted to opacify into the capillary and venous phases. 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 The origin the right vertebral artery is widely patent. The vessel is seen to opacify to the cranial skull base. At the level of the cranial skull base there is a focal area of mild stenosis. Distal to this the vessel assumes normal caliber. The opacified portions of the basilar artery, the superior cerebellar arteries and the anterior-inferior cerebellar arteries is normal into the capillary and venous phases. There is flash filling of the posterior cerebral arteries with unopacified blood in the distal half of the basilar artery from the contralateral vertebral artery. The left vertebral artery origin is widely patent. The vessel opacifies normally to the cranial skull base. There is mild tortuosity in its proximal half. The left vertebrobasilar junction and the left posterior-inferior cerebellar artery demonstrate normal opacification. The basilar  artery, the posterior cerebral arteries, the superior cerebellar arteries and the anterior-inferior cerebellar arteries opacify into the capillary and venous phases. A left common carotid arteriogram demonstrates the left external carotid artery and its major branches to be widely patent. The left internal carotid artery at the bulb to the cranial skull base demonstrates wide patency. The petrous, cavernous and supraclinoid segments are widely patent. The left middle cerebral artery and the left anterior cerebral artery opacify into the capillary and venous phases. There is cross-filling via the anterior communicating artery of the right anterior cerebral artery. 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 Measurements were also performed of the left anterior cerebral artery A1 segment distally, and the right anterior cerebral artery A2 segment proximally. The angiographic findings were reviewed with the patient and the patient's husband. Again briefly reviewed were options of no treatment and conservative management versus endovascular treatment versus consideration for surgical clipping. The husband and the patient consented to endovascular treatment. They were informed that this may entail staged embolization given the wide neck of the aneurysm. The patient was then put under general anesthesia by the Department of Anesthesiology at [place] 1 catheter in the left common carotid artery was then exchanged over a 0.035 inch 300 cm Constance Holster exchange guidewire for a 6 French 80 cm Cook shuttle sheath using biplane roadmap technique constant fluoroscopic guidance. Good aspiration obtained from the hub of the [facility] shuttle sheath. A gentle control arteriogram performed through the [facility] shuttle sheath demonstrated no evidence of spasms, dissections or of intraluminal  filling defects. Over a 0.035 inch Roadrunner guidewire, using biplane roadmap technique and constant fluoroscopic guidance, a Sofia 5 French 115 cm intermediary catheter was then advanced to the distal cervical segment of the left internal carotid artery followed by the [place] 5 [place] guide catheter. The guidewire was removed. Good aspiration was obtained from the hub of the [place] guide catheter. A control arteriogram performed through this demonstrated no change in the intracranial circulation. At this time, the [facility] shuttle sheath was advanced cranially to the mid cervical left ICA. Using biplane roadmap technique and constant fluoroscopic guidance, in a coaxial manner and with constant heparinized saline infusion, a Headway 17  2 tip microcatheter was advanced over a 0.014 inch Softip Synchro micro guidewire to the distal end of the [place] guide catheter. With the micro guidewire leading with a J-tip configuration, the combination was navigated to the supraclinoid left ICA. Using a torque device, the micro guidewire was then advanced into the left anterior cerebral A1 segment followed by the microcatheter. The micro guidewire was then advanced without difficulty across the anterior communicating artery into the right anterior cerebral A2 segment distally followed by the microcatheter. The guidewire was removed. Good aspiration obtained from the hub of the microcatheter. A gentle control arteriogram performed through the microcatheter demonstrated safe position of the tip of the microcatheter. This was then connected to continuous heparinized saline infusion. It was decided to place 4 mm x 24 mm Atlas Neurofrom stent across wide neck of the aneurysm as stage I of the embolization treatment. This was then advanced in a coaxial manner and with constant heparinized saline infusion to the distal end of the microcatheter. The O ring on the delivery microcatheter was then loosened. The tip of the Sofia catheter was now  in the supraclinoid left ICA. Combination of the stent within the microcatheter was then slightly retrieved ensuring that there would be adequate coverage of the neck of the aneurysm. Once there, with slight forward gentle traction with the right hand on the delivery micro guidewire, with the left hand the delivery microcatheter was retrieved unsheathing the distal, and then the proximal portion of the stent. This was deployed without any difficulty. The delivery micro guidewire was then removed. Control arteriogram performed through the [facility] guide catheter in the supraclinoid left ICA demonstrated excellent apposition of the proximal and the distal portion of the stent with wide patency. There was already stasis noted within the dome of the aneurysm. Control arteriograms were then performed through the [facility] guide catheter in the left internal carotid artery at 10, 20 and 40 minutes post deployment of the stent. These continued to demonstrate excellent apposition without evidence of intraluminal filling defects within the stented segment and also distally. Patency of the anterior cerebral arteries bilaterally remained patent. No abnormal dissections or of intraluminal filling defects were seen. The 5 [place] intermediary catheter and the [facility] shuttle sheath were then retrieved and removed into the abdominal aorta and exchanged over a J-tip guidewire for a 6 [place] Pinnacle sheath. Throughout the procedure, the patient's blood pressure and neurological status remained stable. The patient's ACT was maintained in the region of approximately 200 seconds. No evidence of extravasation or dissections or spasm was seen. The 6 French Pinnacle sheath was then removed with manual pressure being held. At the time of removal of the sheath there already was a small hematoma just below the puncture site. This, however, continued to worsen during the process of manual compression. During this time the patient had also been  extubated and was maintaining adequate blood gases. The patient was also responsive appropriately and moving her right leg and left arm. She continued to have difficulty with weakness of the left leg and right arm. Increased tone was noted in the left leg. The patient was, otherwise, appropriately responsive in terms of speech without any facial asymmetry. Her tongue was in the midline. She did not exhibit any sensory changes. However, because of the new neurological changes, a code stroke was called. The neurology service evaluated the patient and it was decided to proceed with further workup with an MRI scan in order to ascertain the reason for the  new neurological changes. However, because of the groin hematoma in the right groin with the patient's blood pressure falling associated with tachycardia, resuscitation was performed with IV fluids albumin and vasopressors. Instead vascular surgery consult was also called. The patient was evaluated by vascular surgery and it was decided that because of the low blood pressure despite vasopressor support and the expanding hematoma in the right groin, surgical aspiration was priority prior to consideration of further workup with an MRI of the brain. After patient had been resuscitated to blood pressure of 110-120 with vasopressors IV fluids and albumin, the patient was taken to the OR for right groin surgical exploration. 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 PLAN: Follow-up of in the clinic approximately 2-4 weeks post discharge. Electronically Signed   By: Luanne Bras M.D.   On: 04/02/2018 16:34    Ronney Lion, PA-C 04/19/2018, 12:39 PM

## 2018-05-05 NOTE — Procedures (Signed)
Extubation Procedure Note  Patient Details:   Name: Rebecca Cox DOB: Jan 18, 1961 MRN: [id number]   Airway Documentation:    Vent end date: 05-03-18 Vent end time: 1736   Evaluation  O2 sats: currently acceptable Complications: No apparent complications Patient did not tolerate procedure well. Bilateral Breath Sounds: Rhonchi, Diminished   Yes    Pt extubated to comfort care. Pt able to whisper name. Stridor noted.   Jonathon Jordan [name] 05-03-18, 5:50 PM

## 2018-05-05 DEATH — deceased

## 2020-02-02 IMAGING — MR MR MRA HEAD W/O CM
10 of 11 series · 34 of 48 positions shown · non-contrast
Comparison: None.

CLINICAL DATA: Headache, new, malignancy suspected. Dizziness and
weakness for 5 days

EXAM:
MRI HEAD WITHOUT CONTRAST
MRA HEAD WITHOUT CONTRAST
TECHNIQUE: Multiplanar, multiecho pulse sequences of the brain and surrounding
structures were obtained without intravenous contrast. Angiographic
images of the head were obtained using MRA technique without
contrast.

[Series 2: [id number] · sagittal · 5.0mm · 0.40mm/px · 2 of 20 slices shown]
[im 1/20]
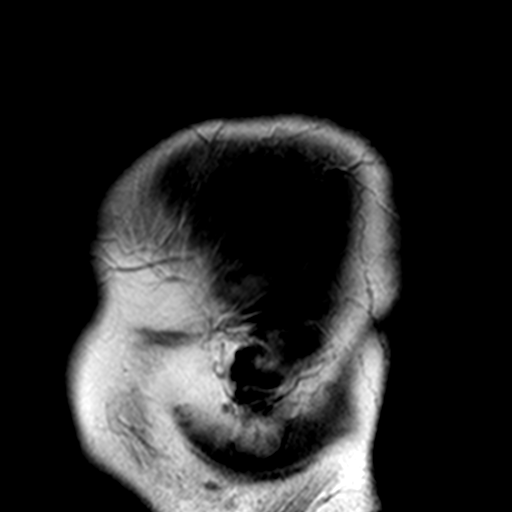
[im 20/20]
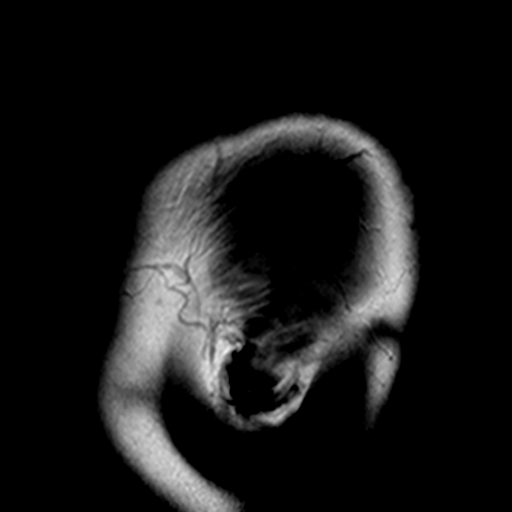

[Series 3: DWI · axial · 3.0mm · 0.73mm/px · z∈[-52,+109]mm · 5 of 55 slices shown (1 of 4)]
[im 1/55]
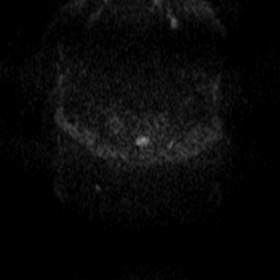
[im 14/55]
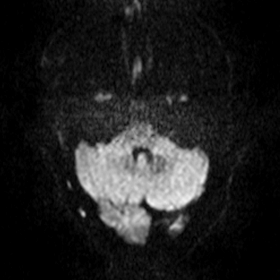
[im 28/55]
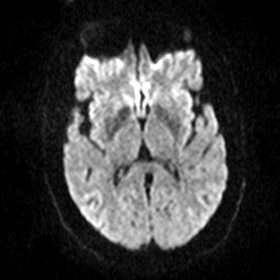
[im 41/55]
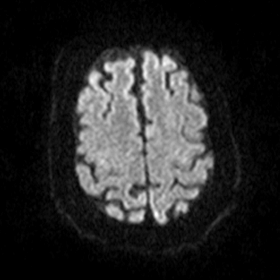
[im 55/55]
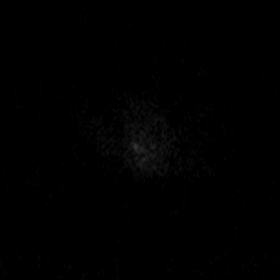

[Series 4: DWI · axial · 3.0mm · 0.74mm/px · z∈[-52,+109]mm · 5 of 55 slices shown (2 of 4)]
[im 1/55]
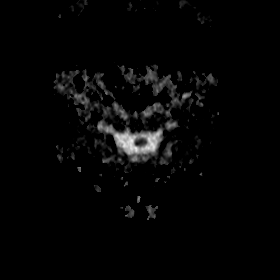
[im 14/55]
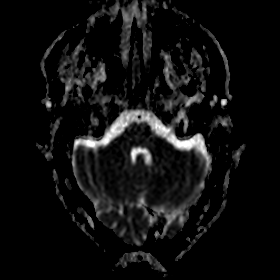
[im 28/55]
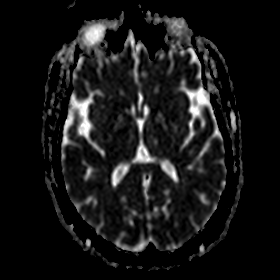
[im 41/55]
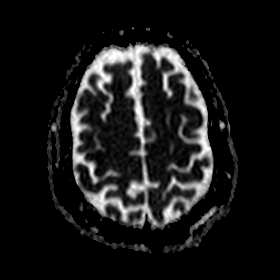
[im 55/55]
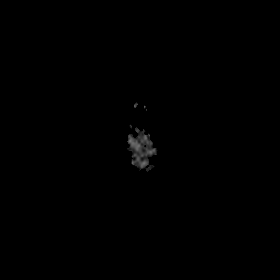

[Series 5: DWI · coronal · 5.0mm · 0.47mm/px · 3 of 34 slices shown (3 of 4)]
[im 1/34]
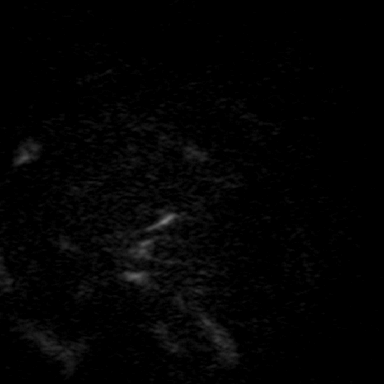
[im 17/34]
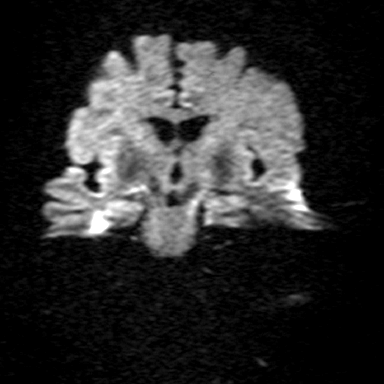
[im 34/34]
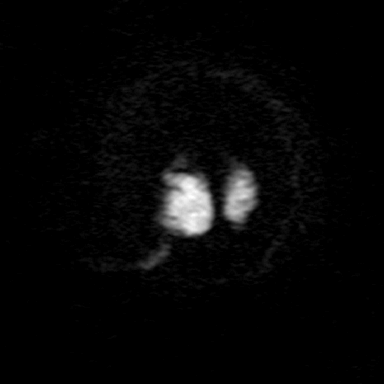

[Series 6: DWI · coronal · 5.0mm · 0.53mm/px · 3 of 34 slices shown (4 of 4)]
[im 1/34]
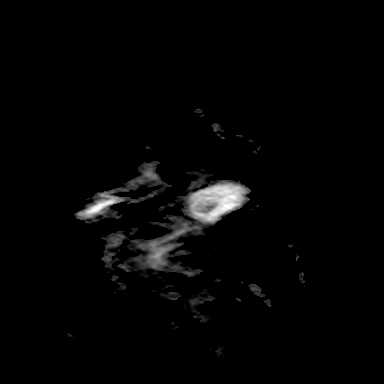
[im 17/34]
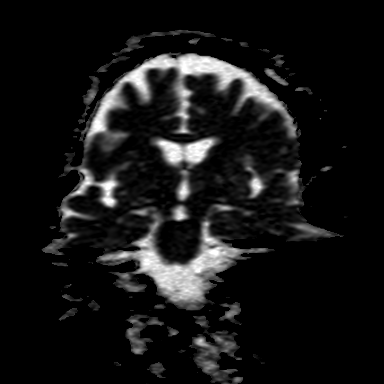
[im 34/34]
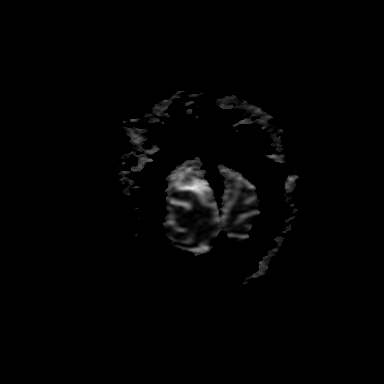

[Series 7: MRA · axial · 0.8mm · 0.33mm/px · 1 of 143 slices shown]
[im 1/143]
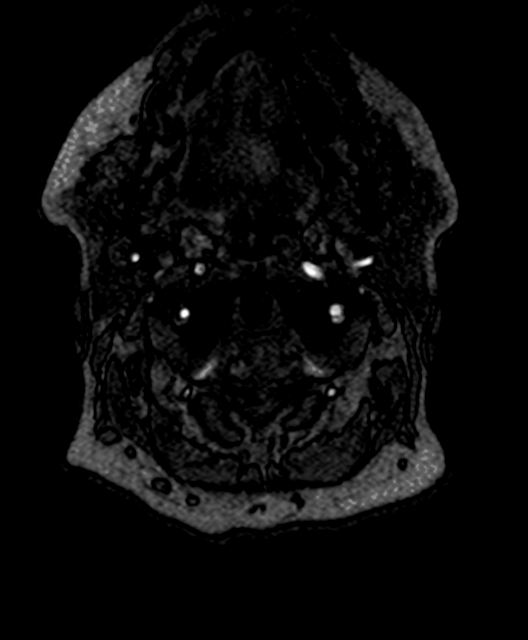

[Series 11: T2 · axial · 5.0mm · 0.66mm/px · z∈[-44,+98]mm · 2 of 23 slices shown (1 of 2)]
[im 1/23]
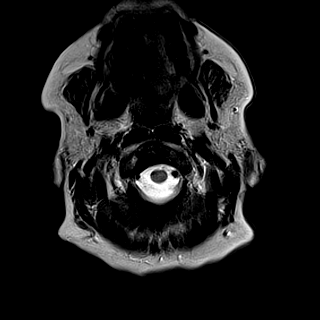
[im 23/23]
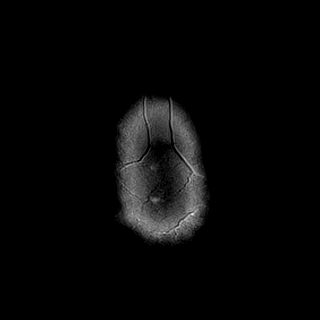

[Series 13: FLAIR · axial · 3.0mm · 0.81mm/px · z∈[-41,+97]mm · 4 of 47 slices shown]
[im 1/47]
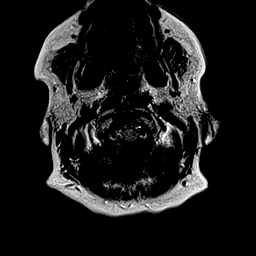
[im 16/47]
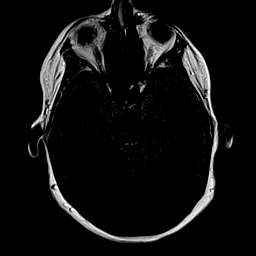
[im 31/47]
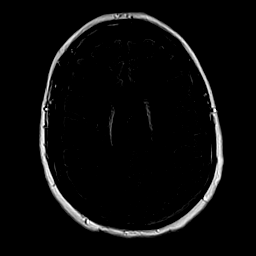
[im 47/47]
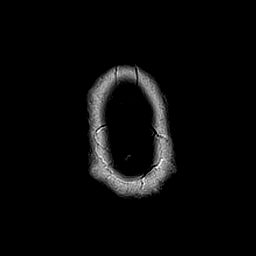

[Series 14: T1 · axial · 2.0mm · 0.42mm/px · z∈[-56,+107]mm · 7 of 83 slices shown]
[im 1/83]
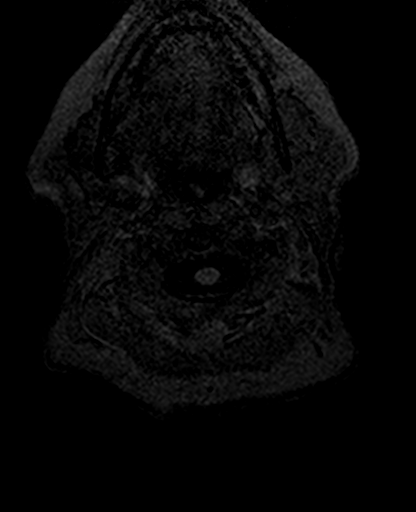
[im 14/83]
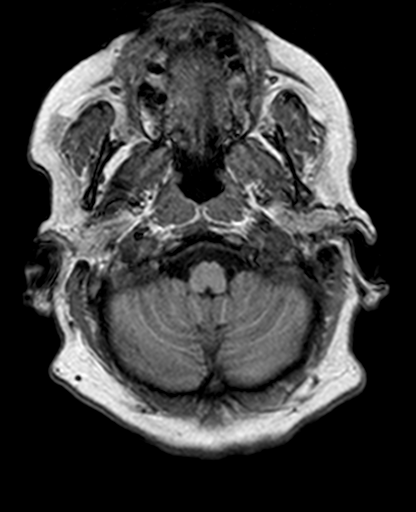
[im 28/83]
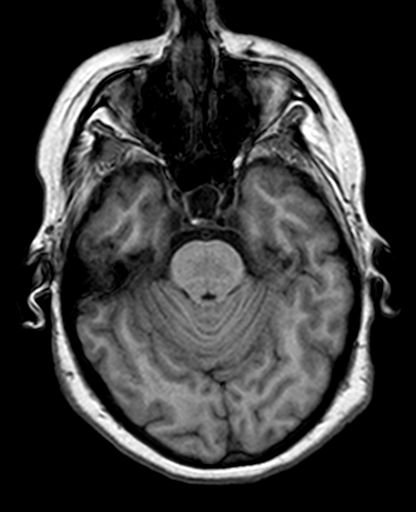
[im 42/83]
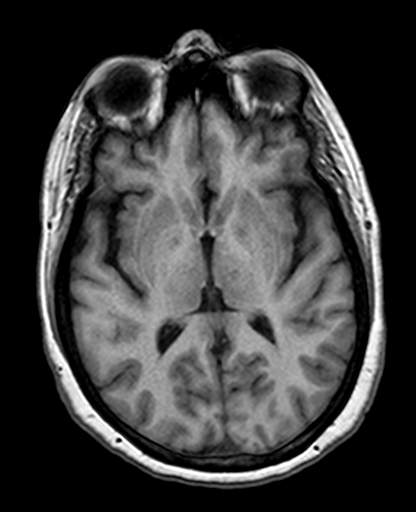
[im 55/83]
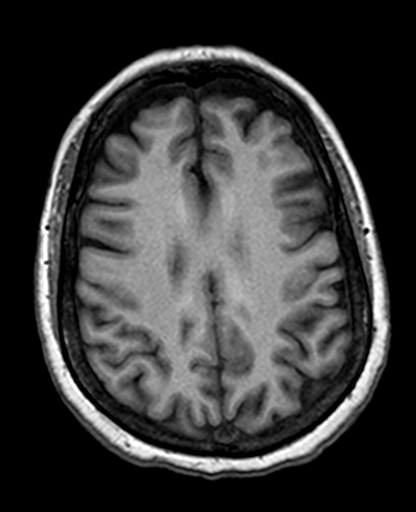
[im 69/83]
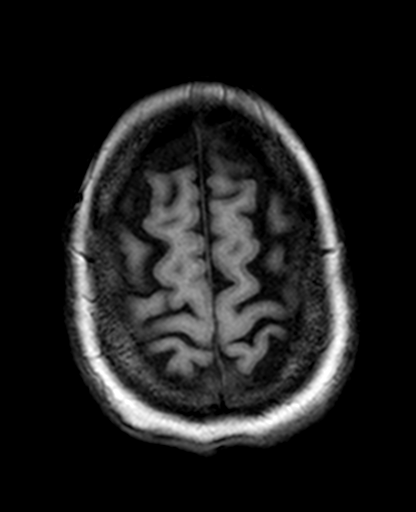
[im 83/83]
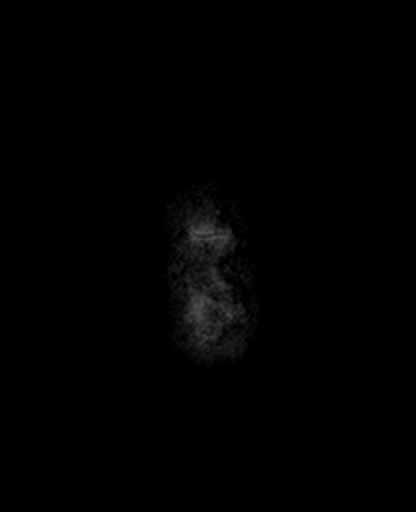

[Series 16: T2 · coronal · 5.0mm · 0.61mm/px · 2 of 28 slices shown (2 of 2)]
[im 1/28]
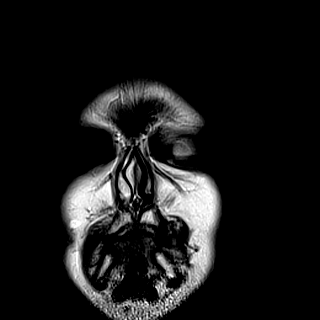
[im 28/28]
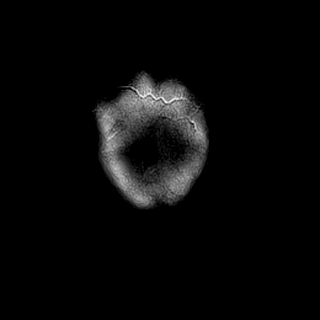

[34 of 48 positions shown; findings below may reference images not displayed]

FINDINGS: MRI HEAD FINDINGS

Brain: No acute infarction, hemorrhage, hydrocephalus, extra-axial
collection or mass lesion. Mild patchy FLAIR hyperintensity in the
pons. Even milder periventricular FLAIR hyperintensity. Normal brain
volume. Partially empty sella considered incidental in isolation.

Vascular: Arterial findings below. Normal dural venous sinus flow
voids.

Skull and upper cervical spine: No evident marrow lesion

Sinuses/Orbits: Negative

MRA HEAD FINDINGS

Left larger than right ICA in the setting of right A1 hypoplasia.
There is a lobulated superiorly directed anterior communicating
artery aneurysm measuring 5 mm in width and 3 mm base to dome. No
major branch occlusion or flow limiting stenosis. Negative for
vessel beading.
IMPRESSION: Brain MRI:

1. No acute finding or specific explanation for headache.
2. Mild signal abnormality in the pons and periventricular white
matter that is usually from chronic small vessel ischemia.

Intracranial MRA:

1. 3 x 5 mm anterior communicating artery aneurysm.
2. No emergent finding.
